# Patient Record
Sex: Female | Born: 1960 | Race: Black or African American | Hispanic: No | Marital: Married | Smoking: Current every day smoker
Health system: Southern US, Community
[De-identification: ages and names within clinical notes are randomized; demographics above are authoritative.]

## PROBLEM LIST (undated history)

## (undated) DIAGNOSIS — L309 Dermatitis, unspecified: Secondary | ICD-10-CM

## (undated) DIAGNOSIS — E785 Hyperlipidemia, unspecified: Secondary | ICD-10-CM

## (undated) DIAGNOSIS — E039 Hypothyroidism, unspecified: Secondary | ICD-10-CM

## (undated) DIAGNOSIS — E079 Disorder of thyroid, unspecified: Secondary | ICD-10-CM

## (undated) HISTORY — PX: ABDOMINAL HYSTERECTOMY: SHX81

## (undated) HISTORY — DX: Hyperlipidemia, unspecified: E78.5

## (undated) HISTORY — PX: TUBAL LIGATION: SHX77

---

## 1998-08-11 ENCOUNTER — Emergency Department (HOSPITAL_COMMUNITY): Admission: EM | Admit: 1998-08-11 | Discharge: 1998-08-11 | Payer: Self-pay | Admitting: Emergency Medicine

## 1999-10-28 ENCOUNTER — Observation Stay (HOSPITAL_COMMUNITY): Admission: AD | Admit: 1999-10-28 | Discharge: 1999-10-29 | Payer: Self-pay | Admitting: *Deleted

## 1999-11-04 ENCOUNTER — Encounter (INDEPENDENT_AMBULATORY_CARE_PROVIDER_SITE_OTHER): Payer: Self-pay | Admitting: Specialist

## 1999-11-04 ENCOUNTER — Other Ambulatory Visit: Admission: RE | Admit: 1999-11-04 | Discharge: 1999-11-04 | Payer: Self-pay | Admitting: *Deleted

## 1999-11-04 ENCOUNTER — Encounter: Payer: Self-pay | Admitting: *Deleted

## 1999-11-04 ENCOUNTER — Encounter: Admission: RE | Admit: 1999-11-04 | Discharge: 1999-11-04 | Payer: Self-pay | Admitting: *Deleted

## 1999-11-22 ENCOUNTER — Encounter: Payer: Self-pay | Admitting: *Deleted

## 1999-11-25 ENCOUNTER — Inpatient Hospital Stay (HOSPITAL_COMMUNITY): Admission: RE | Admit: 1999-11-25 | Discharge: 1999-11-27 | Payer: Self-pay | Admitting: *Deleted

## 1999-11-25 ENCOUNTER — Encounter (INDEPENDENT_AMBULATORY_CARE_PROVIDER_SITE_OTHER): Payer: Self-pay | Admitting: Specialist

## 2003-12-04 ENCOUNTER — Emergency Department (HOSPITAL_COMMUNITY): Admission: EM | Admit: 2003-12-04 | Discharge: 2003-12-04 | Payer: Self-pay | Admitting: Emergency Medicine

## 2003-12-04 IMAGING — CR DG HIP (WITH OR WITHOUT PELVIS) 2-3V*L*
3 series · 3 of 3 positions shown · non-contrast
Comparison: none

CLINICAL DATA: Pain.  No known injury. 
 LEFT HIP COMPLETE 
 There is no evidence of fracture or dislocation.   No other significant bone or soft tissue abnormalities are identified.   The joint spaces are within normal limits.

 IMPRESSION
 Normal study.

[view not recorded (1 of 3)]
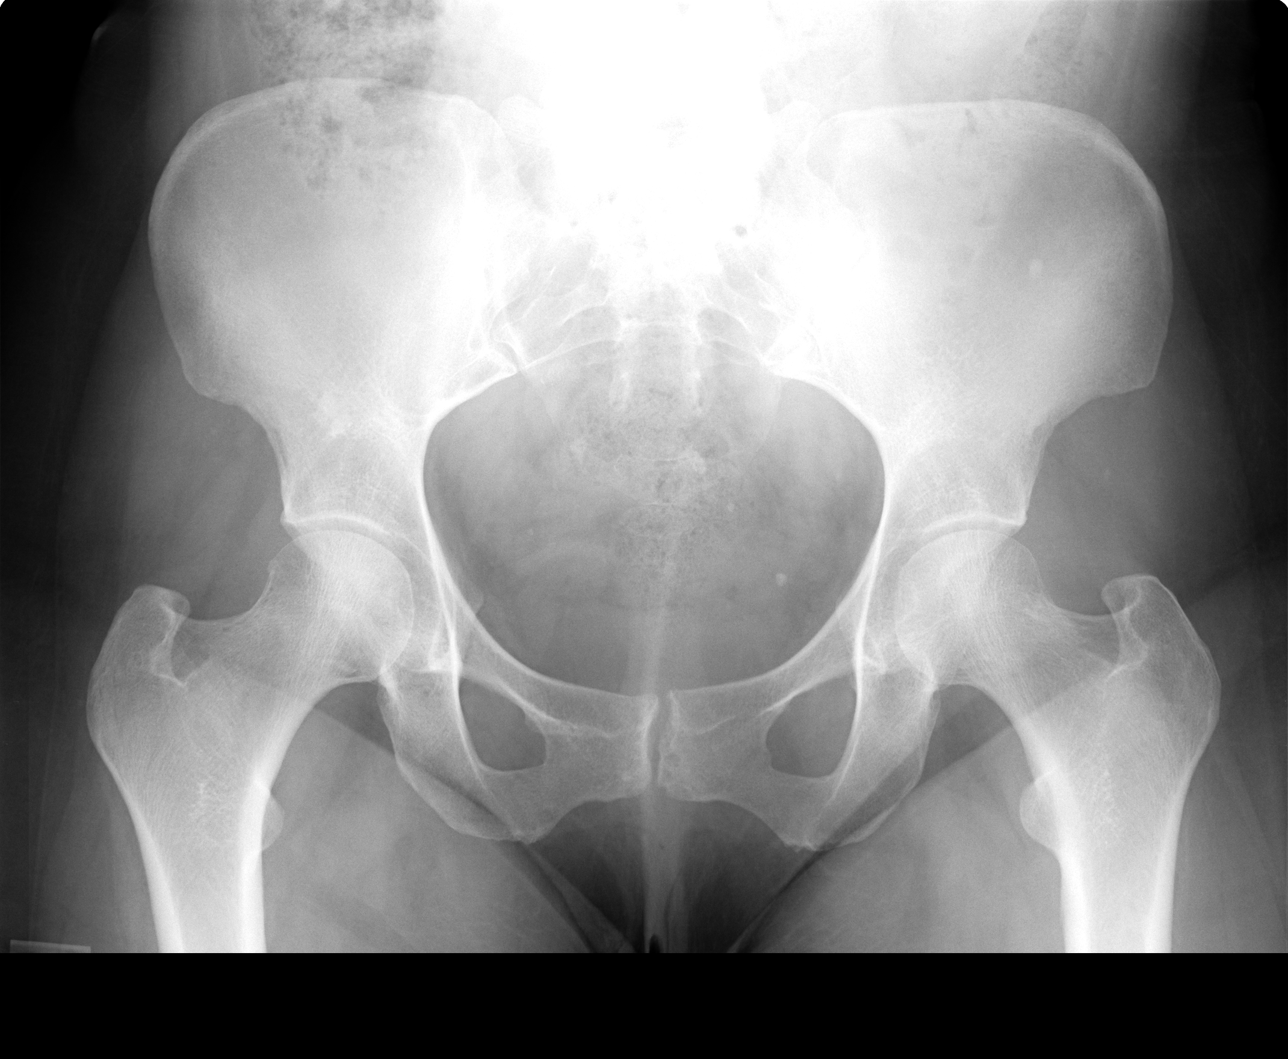

[view not recorded (2 of 3)]
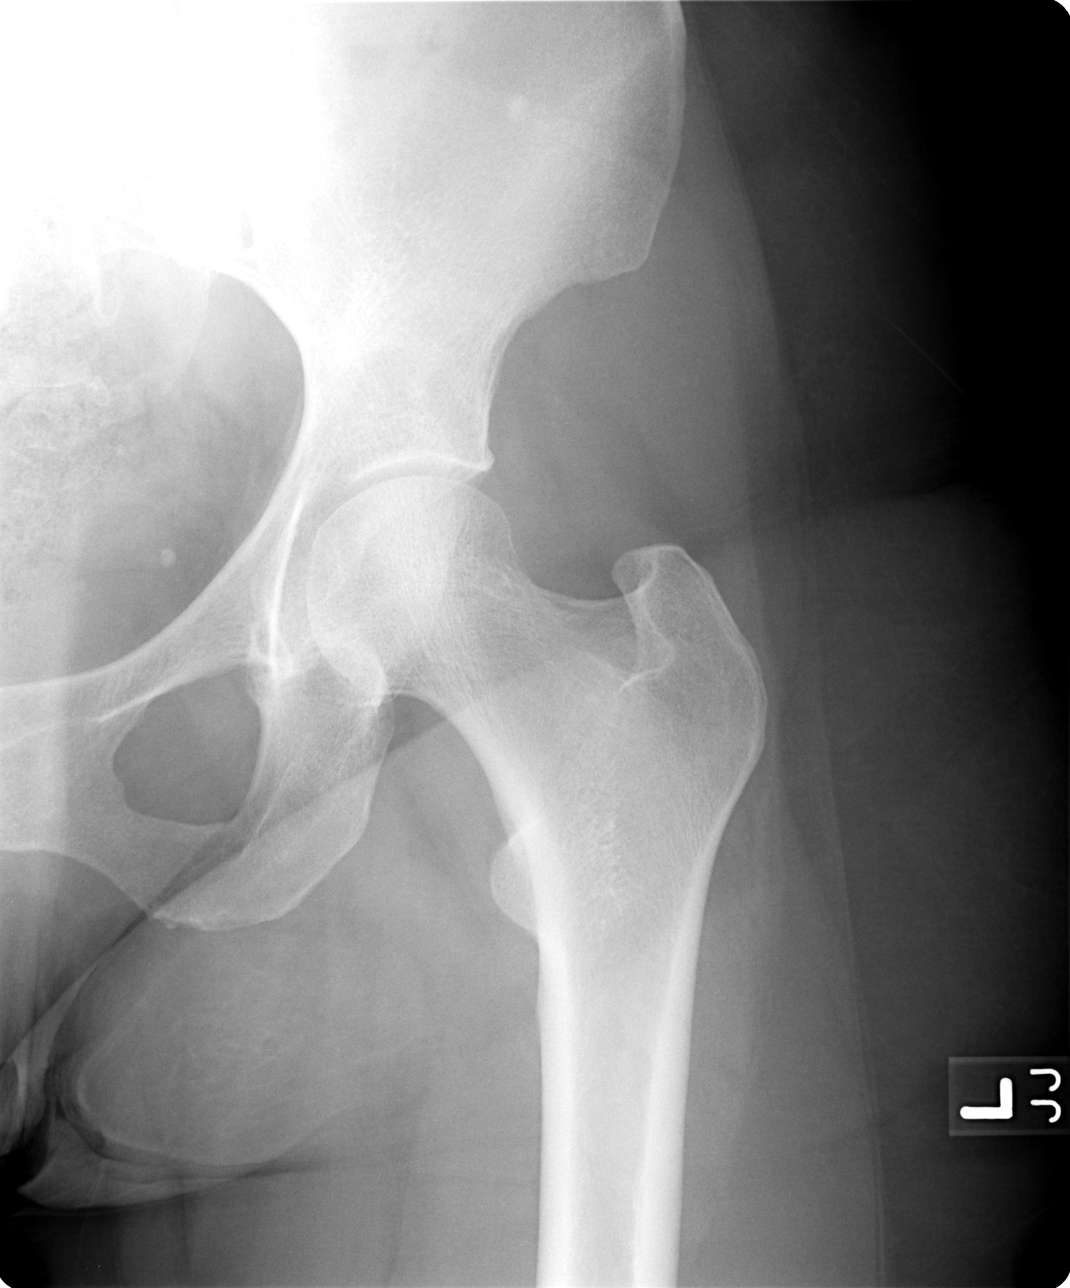

[view not recorded (3 of 3)]
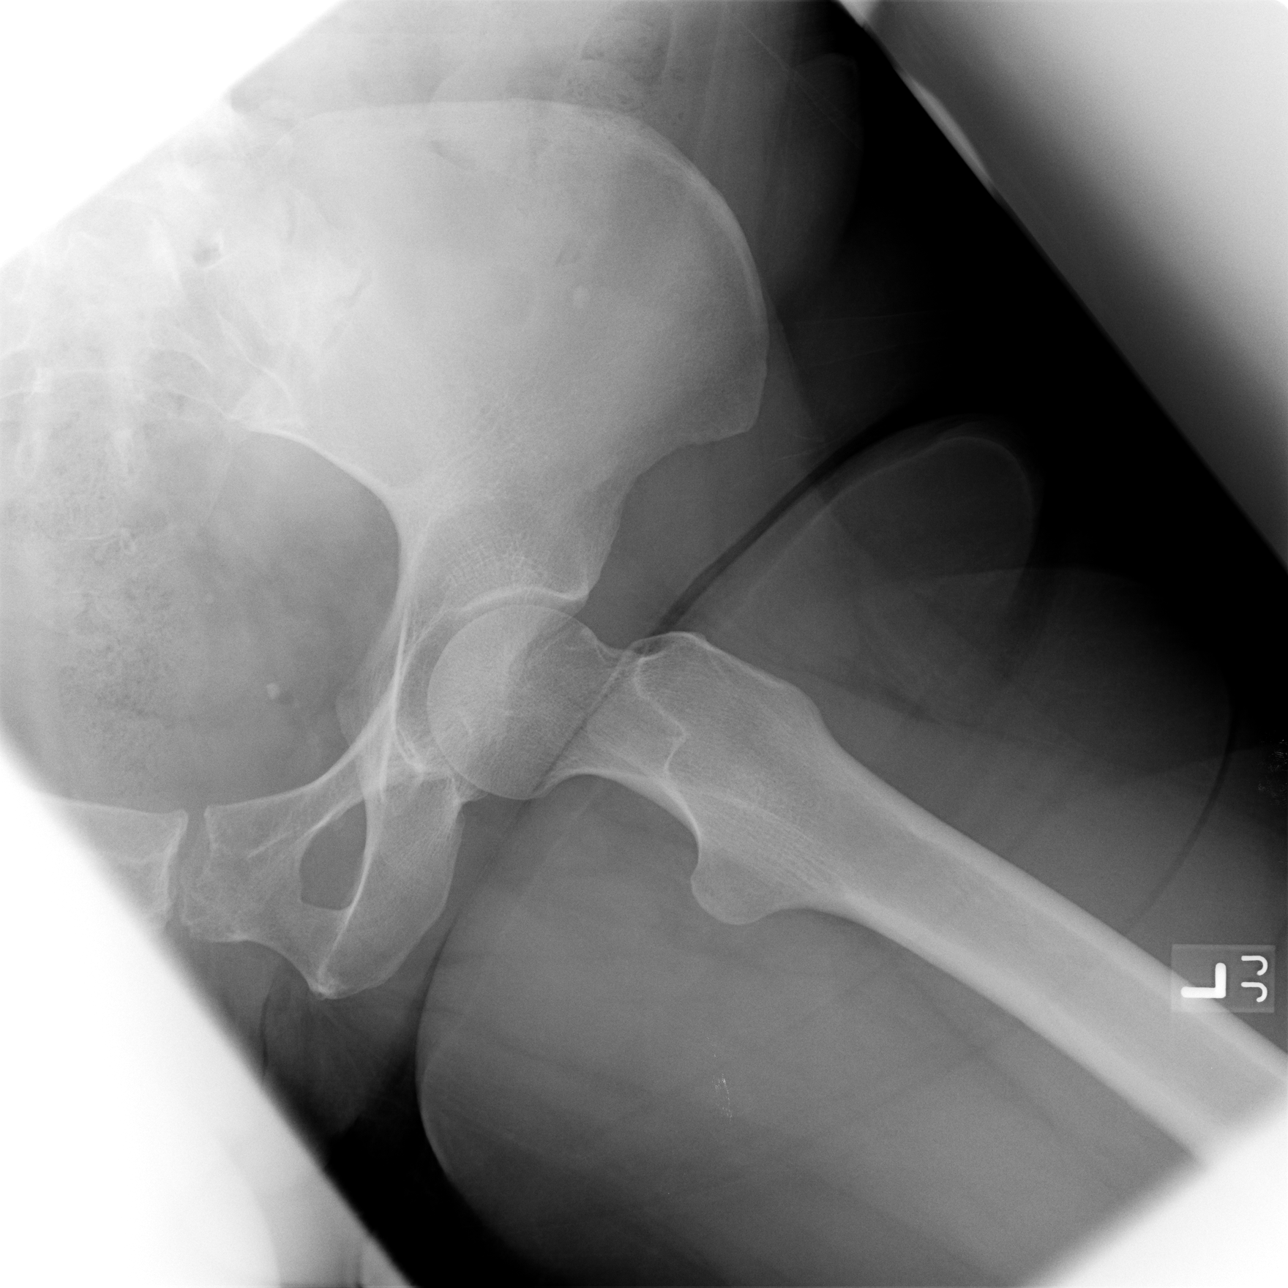

[3 of 3 positions shown; findings below may reference images not displayed]

## 2003-12-07 ENCOUNTER — Emergency Department (HOSPITAL_COMMUNITY): Admission: EM | Admit: 2003-12-07 | Discharge: 2003-12-07 | Payer: Self-pay | Admitting: Emergency Medicine

## 2004-03-13 ENCOUNTER — Emergency Department (HOSPITAL_COMMUNITY): Admission: EM | Admit: 2004-03-13 | Discharge: 2004-03-13 | Payer: Self-pay | Admitting: Emergency Medicine

## 2004-03-13 IMAGING — CR DG CHEST 2V
2 series · 2 of 2 positions shown · non-contrast
Comparison: none

CLINICAL DATA: Cough. Chest pain. 
 CHEST ? TWO VIEWS [DATE]:

[view not recorded (1 of 2)]
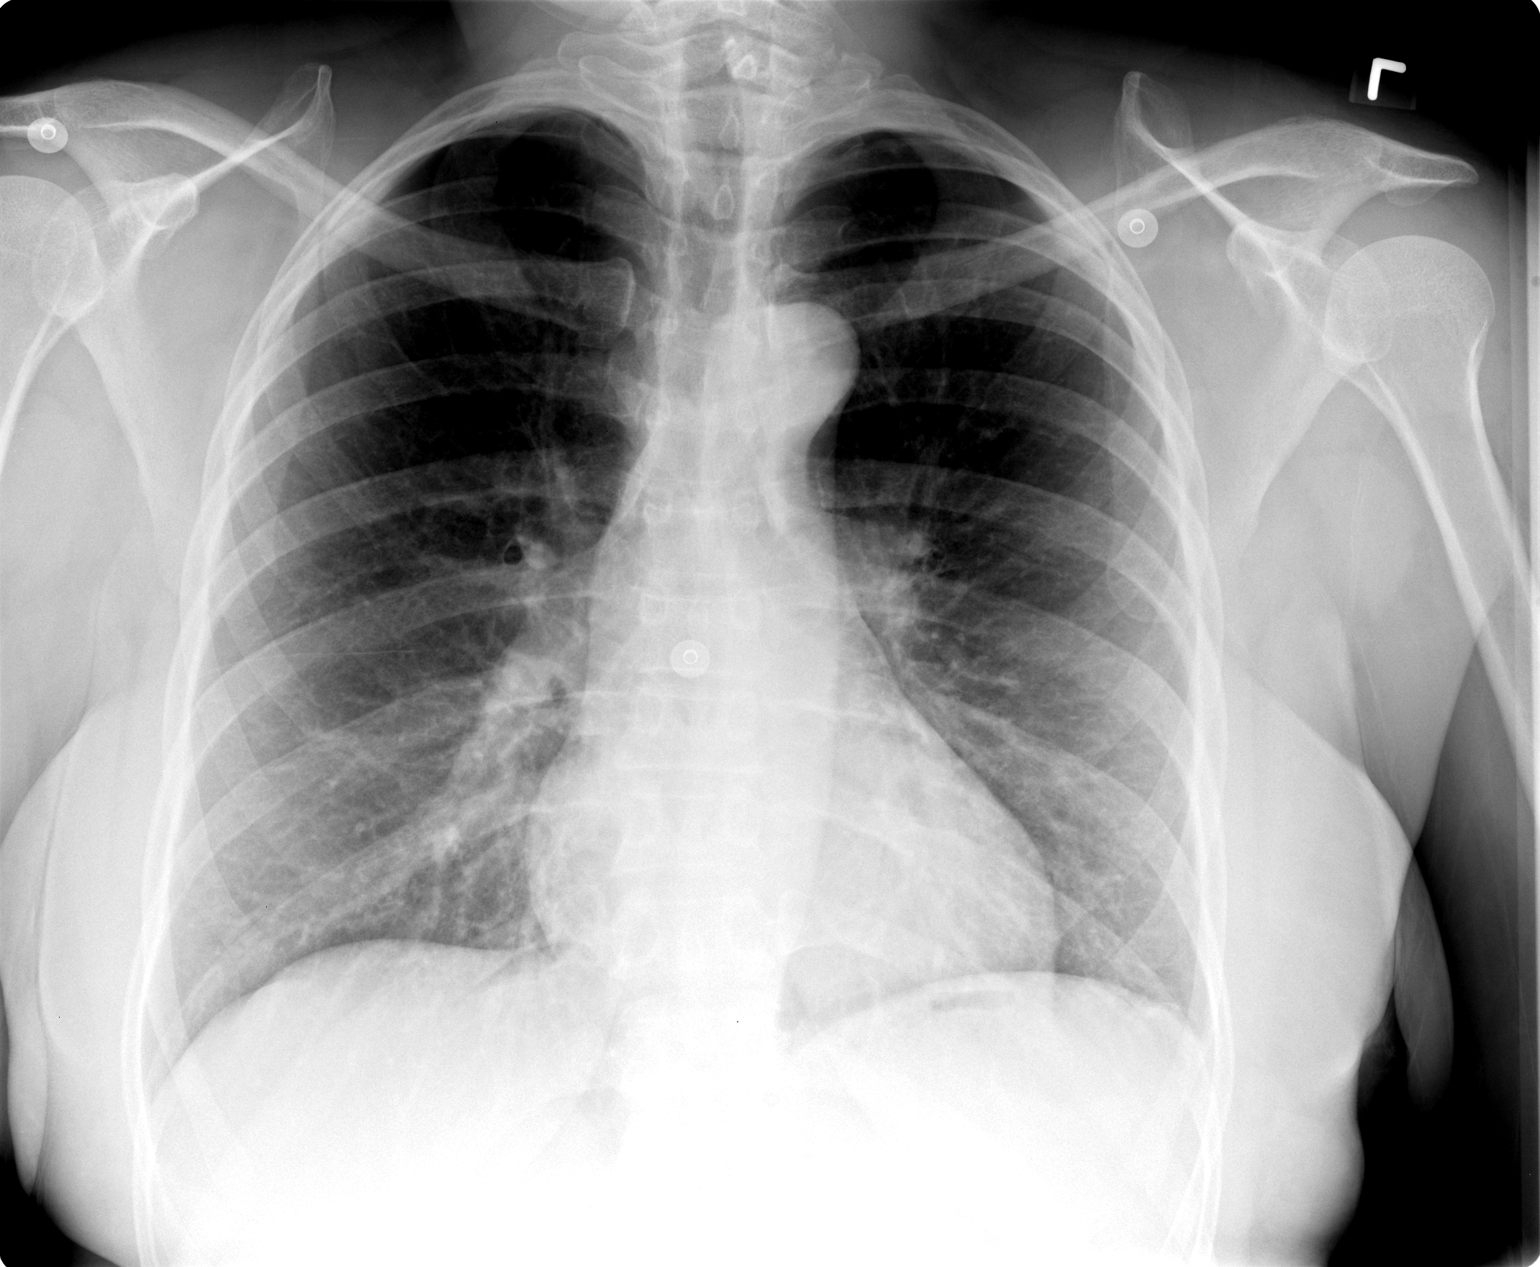

[view not recorded (2 of 2)]
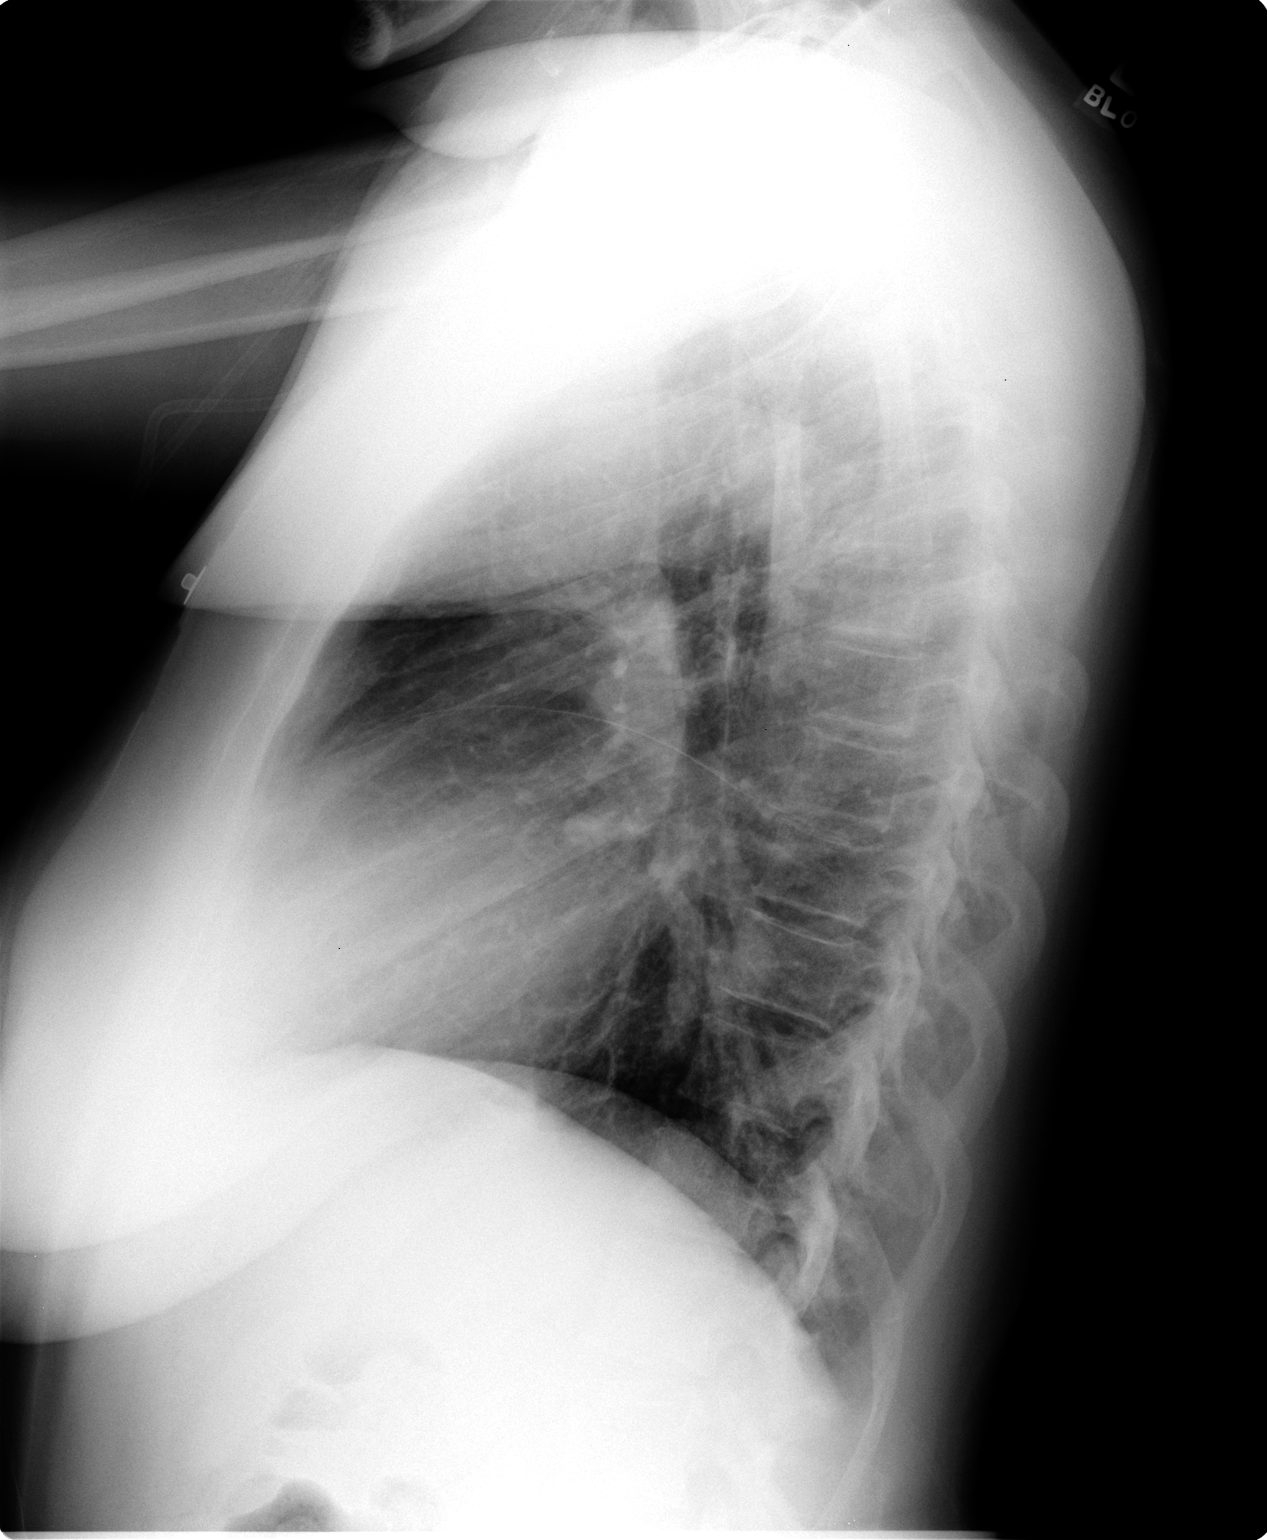

[2 of 2 positions shown; findings below may reference images not displayed]

FINDINGS: Upper limits normal heart size noted with mild peribronchial thickening.  No evidence of focal airspace disease.  No pleural effusions present.  Bony thorax and upper abdomen are unremarkable.
IMPRESSION: Upper limits normal heart size and mild peribronchial thickening.

## 2004-03-17 ENCOUNTER — Emergency Department (HOSPITAL_COMMUNITY): Admission: EM | Admit: 2004-03-17 | Discharge: 2004-03-17 | Payer: Self-pay | Admitting: Emergency Medicine

## 2004-03-26 ENCOUNTER — Emergency Department (HOSPITAL_COMMUNITY): Admission: EM | Admit: 2004-03-26 | Discharge: 2004-03-26 | Payer: Self-pay | Admitting: Emergency Medicine

## 2004-08-06 ENCOUNTER — Ambulatory Visit (HOSPITAL_COMMUNITY): Admission: RE | Admit: 2004-08-06 | Discharge: 2004-08-06 | Payer: Self-pay | Admitting: *Deleted

## 2005-01-07 ENCOUNTER — Ambulatory Visit: Payer: Self-pay | Admitting: Family Medicine

## 2005-01-13 ENCOUNTER — Emergency Department (HOSPITAL_COMMUNITY): Admission: EM | Admit: 2005-01-13 | Discharge: 2005-01-13 | Payer: Self-pay | Admitting: Emergency Medicine

## 2005-01-24 ENCOUNTER — Ambulatory Visit: Payer: Self-pay | Admitting: *Deleted

## 2005-01-25 ENCOUNTER — Ambulatory Visit: Payer: Self-pay | Admitting: Internal Medicine

## 2005-02-09 ENCOUNTER — Ambulatory Visit: Payer: Self-pay | Admitting: Family Medicine

## 2005-03-04 ENCOUNTER — Ambulatory Visit: Payer: Self-pay | Admitting: Internal Medicine

## 2005-04-06 ENCOUNTER — Ambulatory Visit: Payer: Self-pay | Admitting: Family Medicine

## 2005-09-15 ENCOUNTER — Emergency Department (HOSPITAL_COMMUNITY): Admission: EM | Admit: 2005-09-15 | Discharge: 2005-09-15 | Payer: Self-pay | Admitting: Emergency Medicine

## 2006-01-11 ENCOUNTER — Emergency Department (HOSPITAL_COMMUNITY): Admission: EM | Admit: 2006-01-11 | Discharge: 2006-01-11 | Payer: Self-pay | Admitting: Pulmonary Disease

## 2007-03-16 ENCOUNTER — Emergency Department (HOSPITAL_COMMUNITY): Admission: EM | Admit: 2007-03-16 | Discharge: 2007-03-16 | Payer: Self-pay | Admitting: Emergency Medicine

## 2007-03-18 ENCOUNTER — Emergency Department (HOSPITAL_COMMUNITY): Admission: EM | Admit: 2007-03-18 | Discharge: 2007-03-18 | Payer: Self-pay | Admitting: Emergency Medicine

## 2007-03-21 ENCOUNTER — Emergency Department (HOSPITAL_COMMUNITY): Admission: EM | Admit: 2007-03-21 | Discharge: 2007-03-21 | Payer: Self-pay | Admitting: Family Medicine

## 2007-04-10 ENCOUNTER — Emergency Department (HOSPITAL_COMMUNITY): Admission: EM | Admit: 2007-04-10 | Discharge: 2007-04-11 | Payer: Self-pay | Admitting: Emergency Medicine

## 2007-04-11 ENCOUNTER — Encounter (INDEPENDENT_AMBULATORY_CARE_PROVIDER_SITE_OTHER): Payer: Self-pay | Admitting: *Deleted

## 2007-04-11 ENCOUNTER — Ambulatory Visit (HOSPITAL_COMMUNITY): Admission: RE | Admit: 2007-04-11 | Discharge: 2007-04-11 | Payer: Self-pay | Admitting: *Deleted

## 2007-04-11 ENCOUNTER — Ambulatory Visit: Payer: Self-pay | Admitting: *Deleted

## 2007-06-08 ENCOUNTER — Emergency Department (HOSPITAL_COMMUNITY): Admission: EM | Admit: 2007-06-08 | Discharge: 2007-06-08 | Payer: Self-pay | Admitting: Emergency Medicine

## 2007-06-21 ENCOUNTER — Emergency Department (HOSPITAL_COMMUNITY): Admission: EM | Admit: 2007-06-21 | Discharge: 2007-06-21 | Payer: Self-pay | Admitting: Emergency Medicine

## 2007-06-21 IMAGING — CR DG SHOULDER 2+V*L*
3 series · 3 of 3 positions shown · non-contrast
Comparison: none

CLINICAL DATA: Chest and left shoulder pain since this morning. 
 LEFT SHOULDER ? 3 VIEW:

[w shoulder ap internal left]
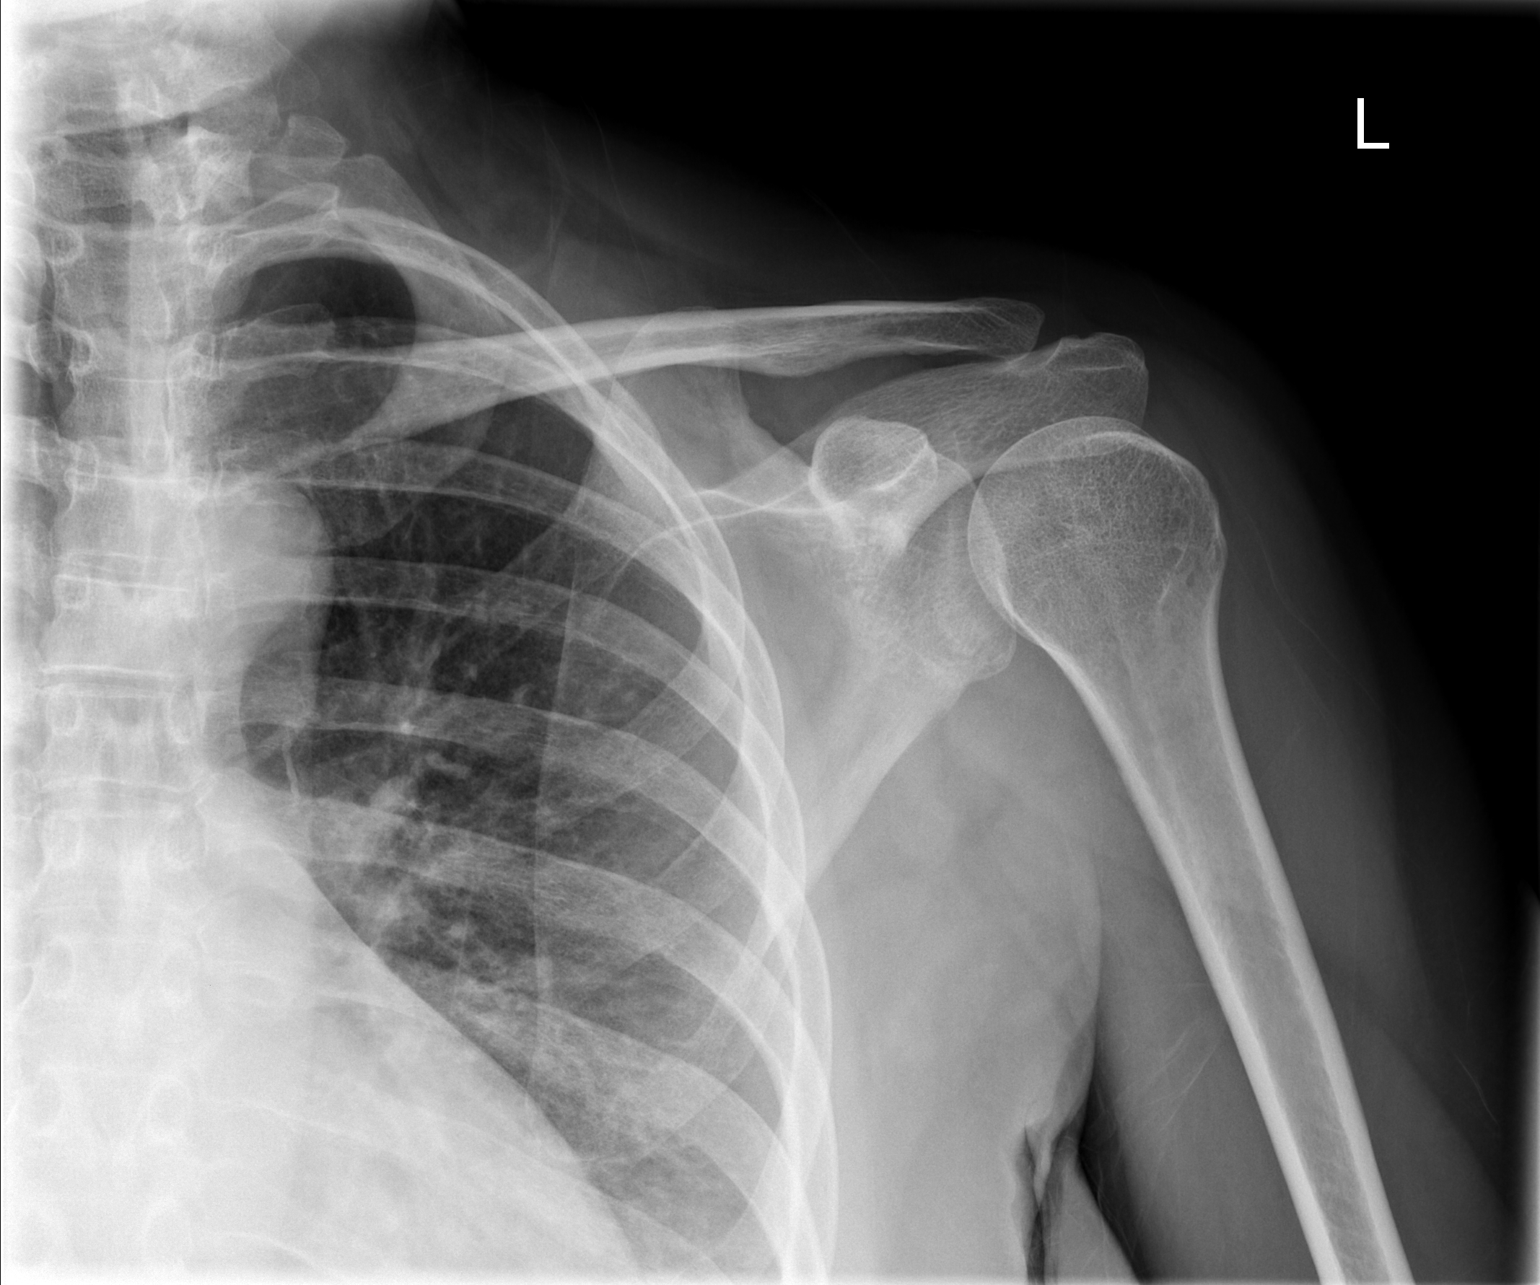

[w shoulder ap external left]
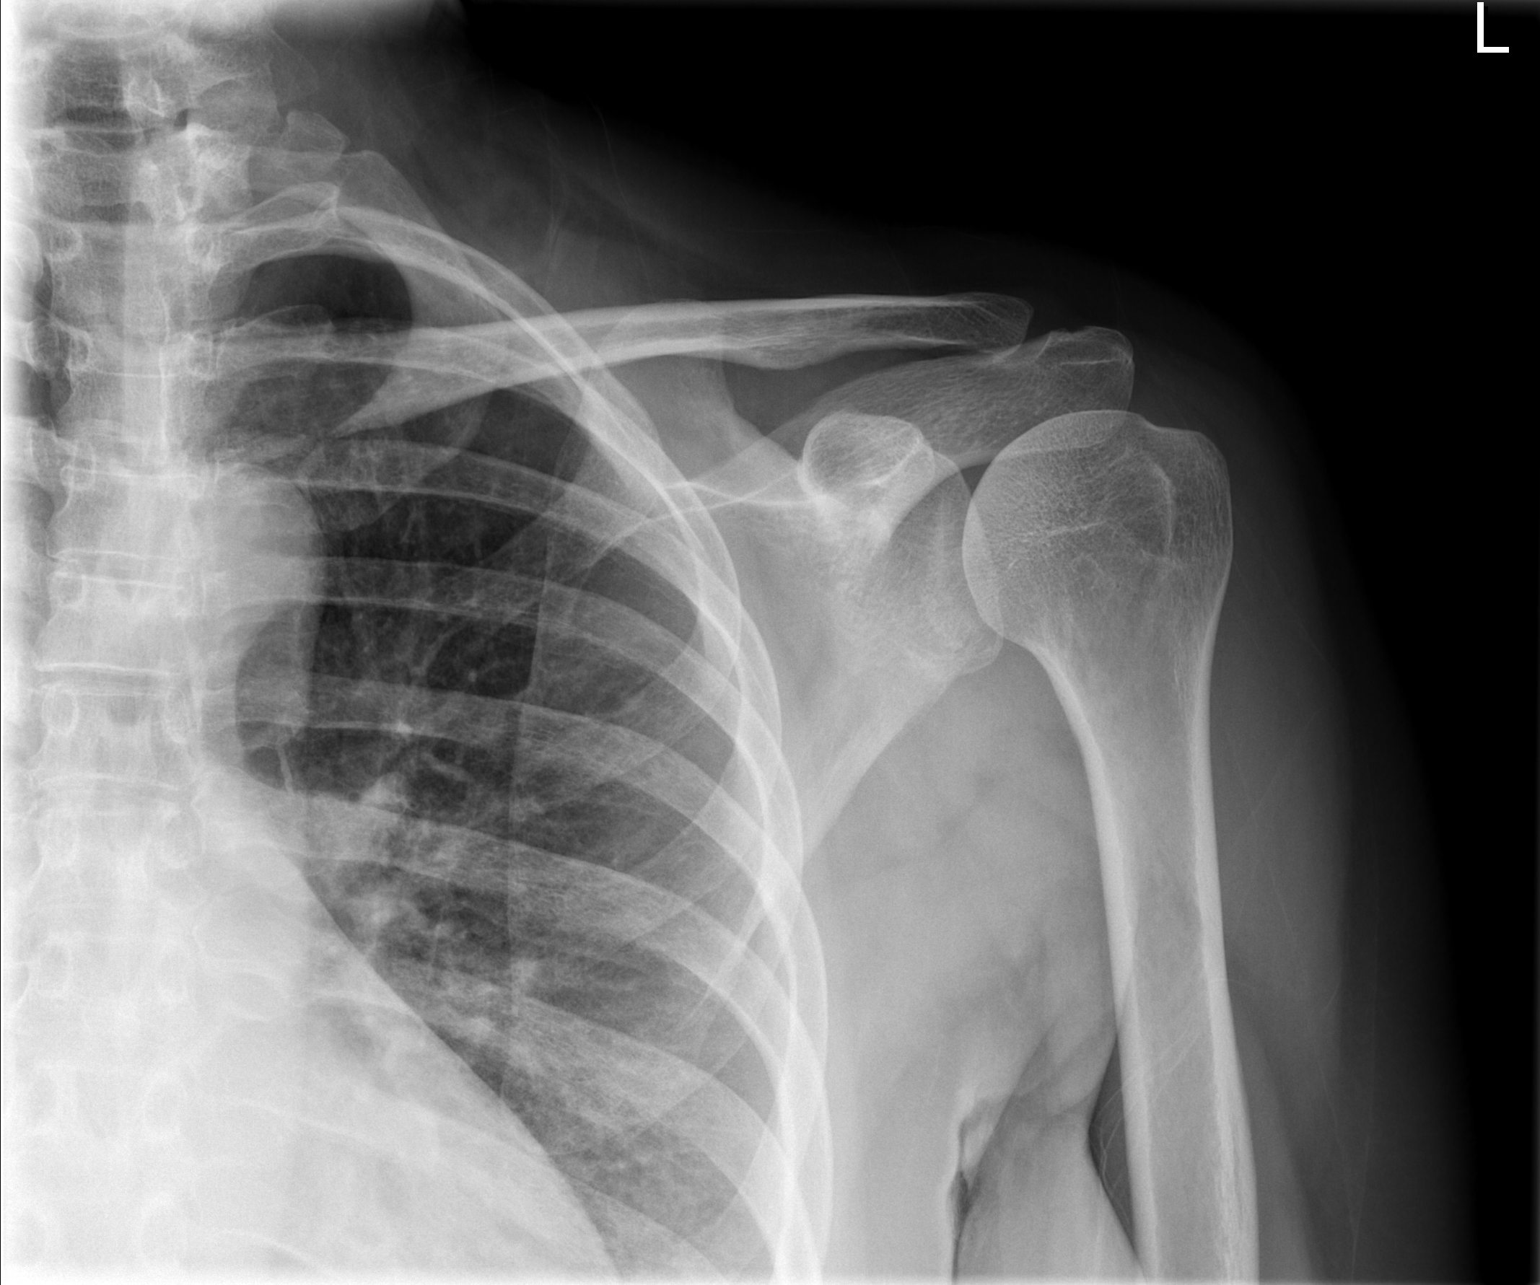

[w shoulder y view left]
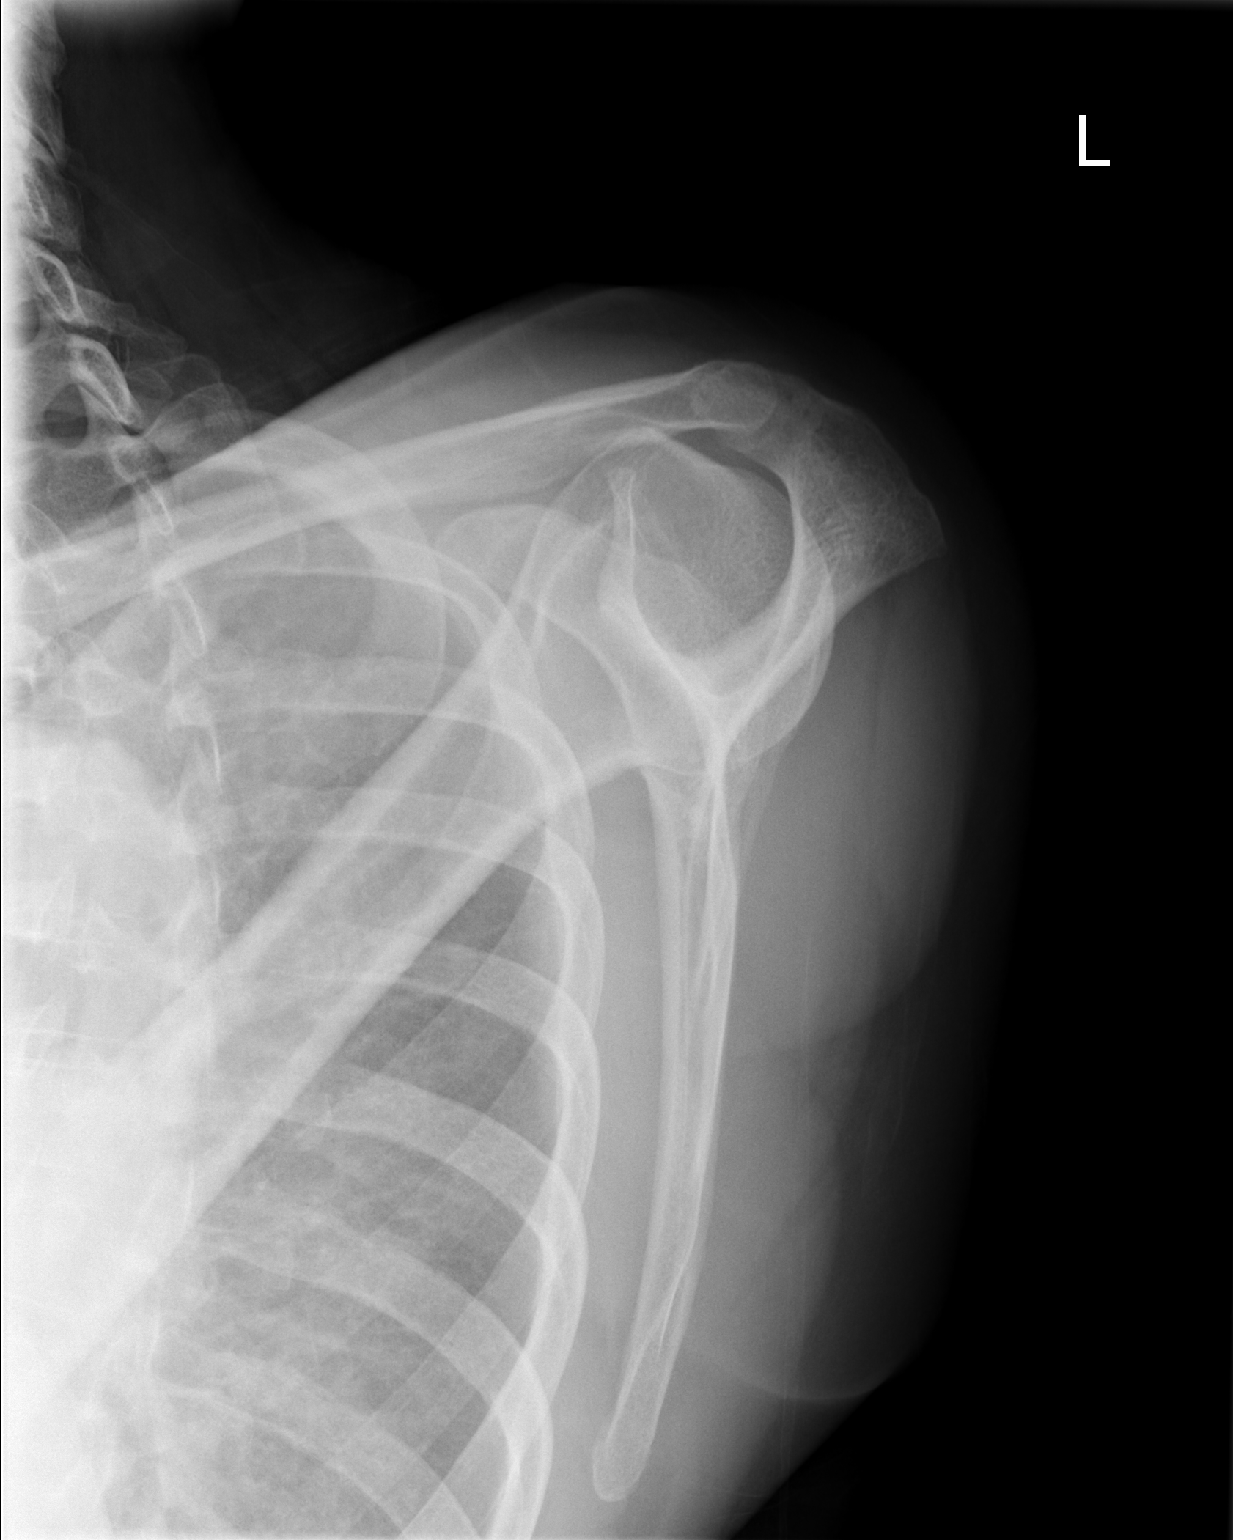

[3 of 3 positions shown; findings below may reference images not displayed]

FINDINGS: There is no evidence of fracture or dislocation.  There is no evidence of arthropathy or other focal bone abnormality.  Soft tissues are unremarkable.
IMPRESSION: Negative.

## 2007-06-21 IMAGING — CR DG CHEST 2V
2 series · 2 of 2 positions shown · non-contrast
Comparison: [DATE].

CLINICAL DATA: Chest and left shoulder pain. 
 CHEST - 2 VIEW:

[w chest pa]
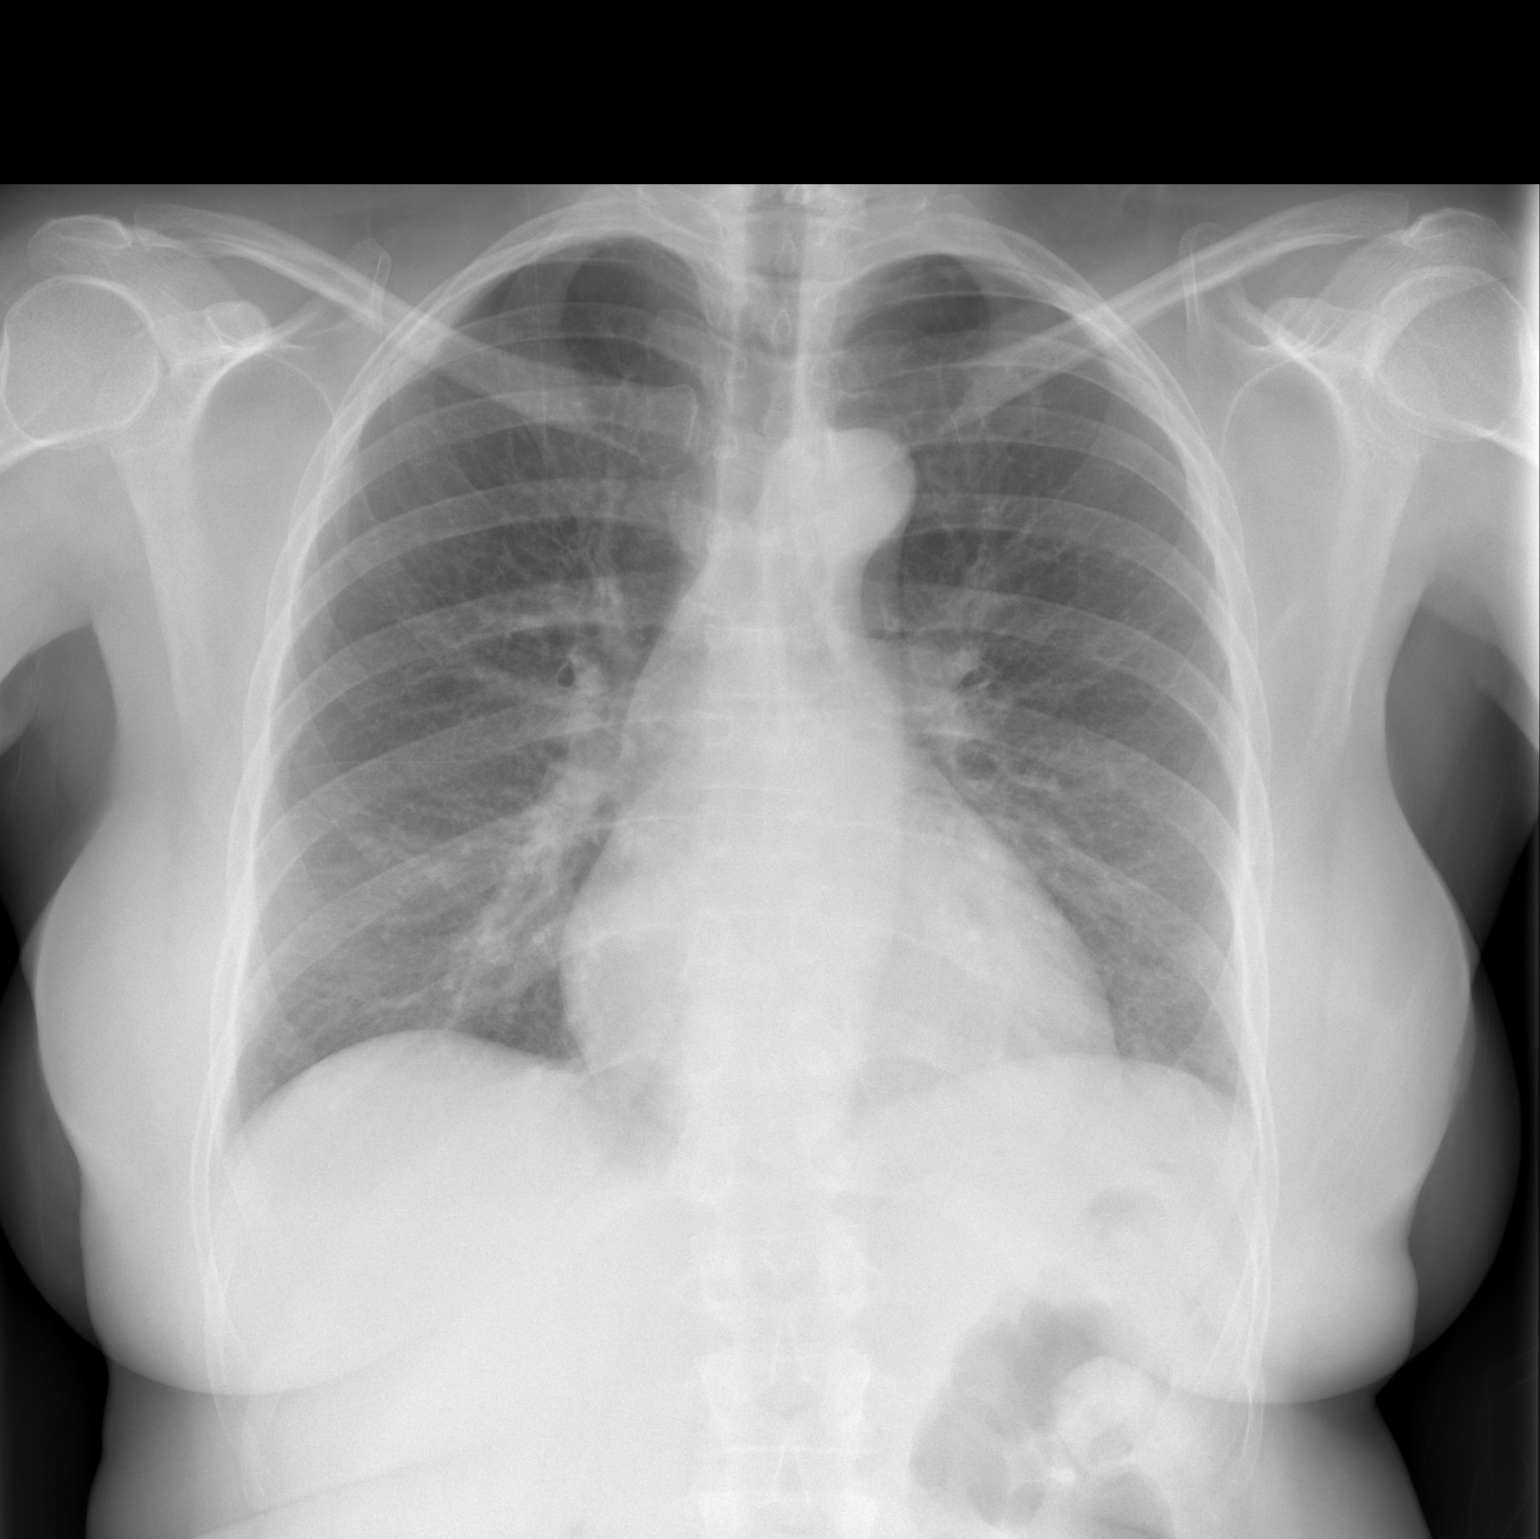

[w chest lat]
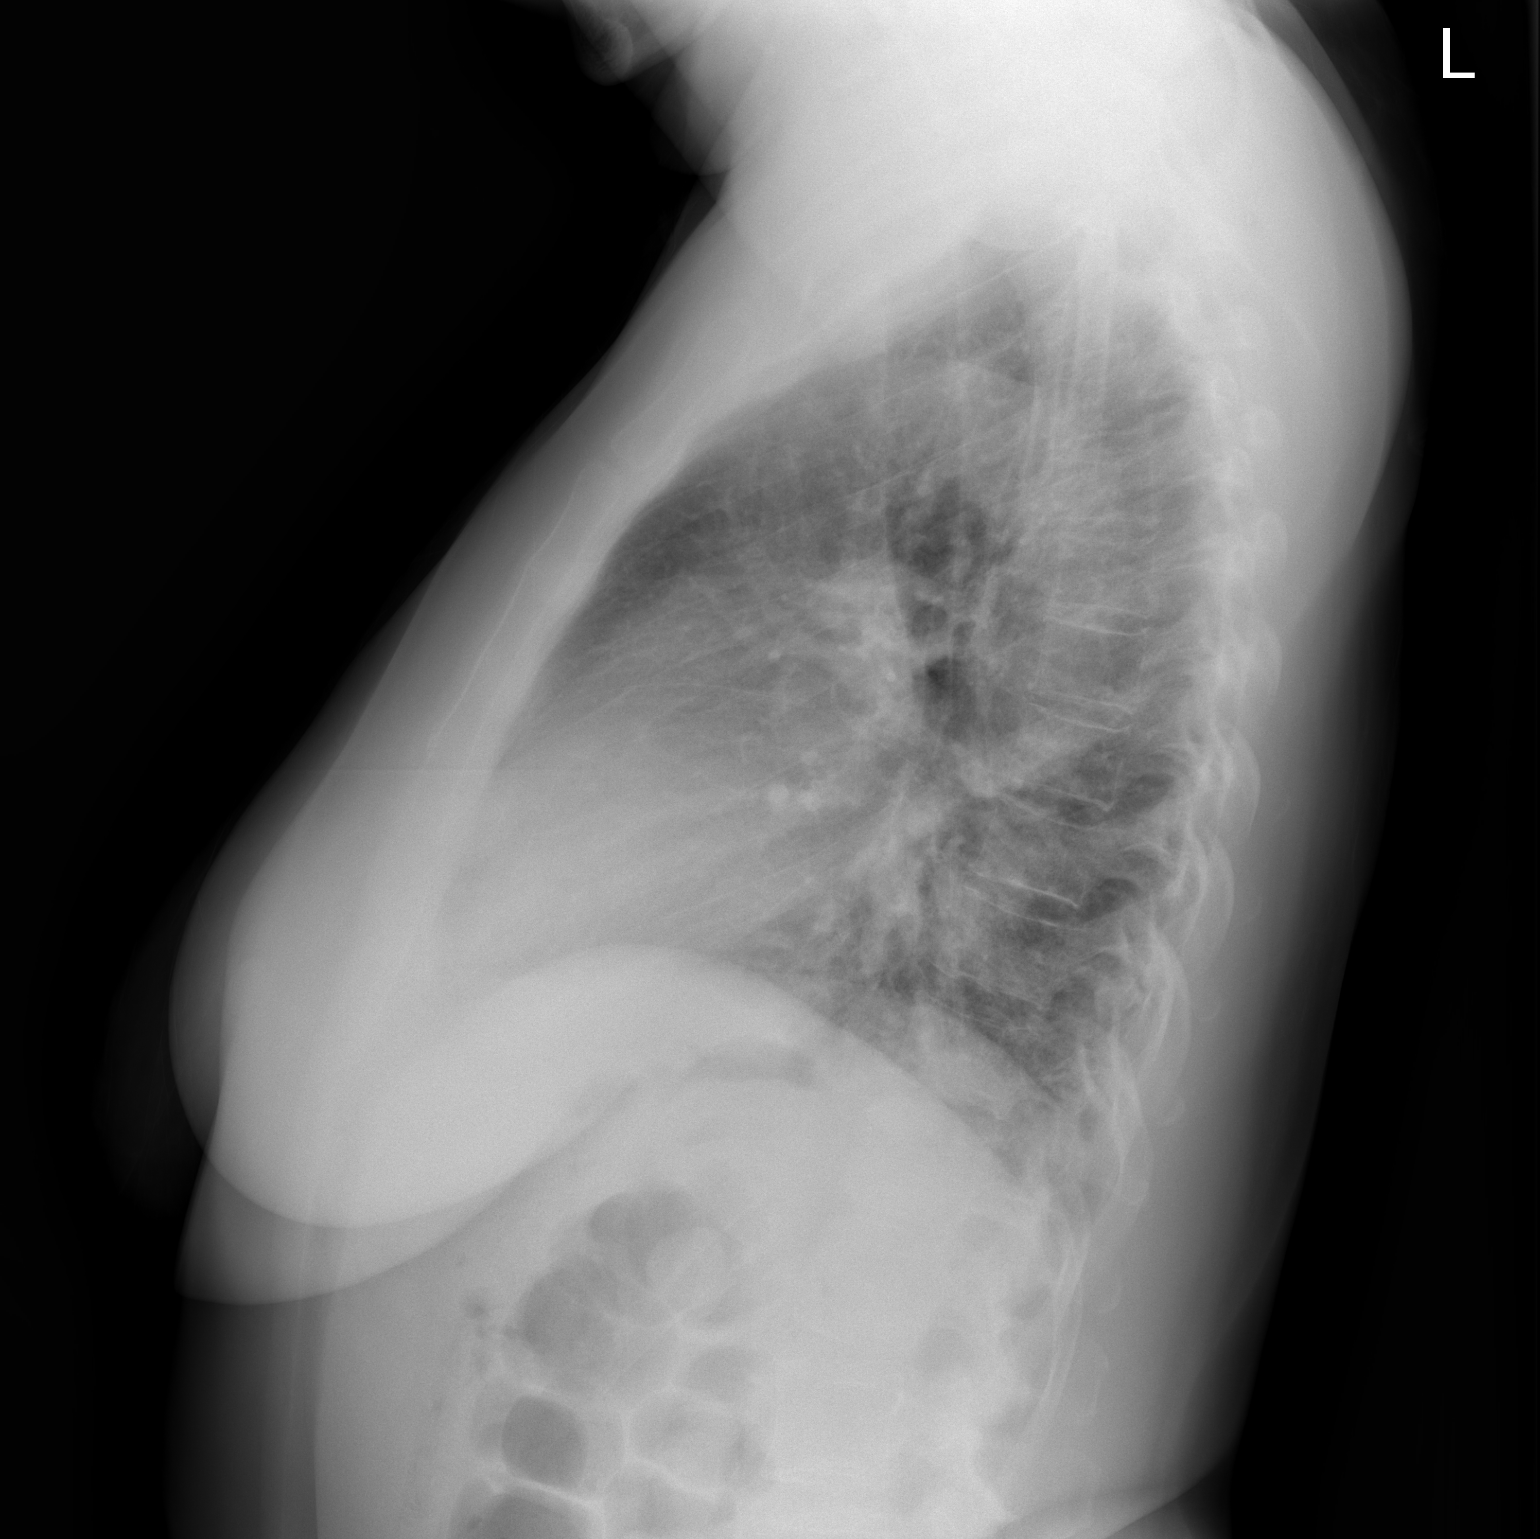

[2 of 2 positions shown; findings below may reference images not displayed]

FINDINGS: The heart size and mediastinal contours are stable.  Minimal atelectasis is present at the left lung base.   The lungs are otherwise clear.  There is no pleural effusion or pneumothorax.
IMPRESSION: Minimal left basilar atelectasis.  No other acute chest findings.

## 2007-06-27 ENCOUNTER — Emergency Department (HOSPITAL_COMMUNITY): Admission: EM | Admit: 2007-06-27 | Discharge: 2007-06-27 | Payer: Self-pay | Admitting: Family Medicine

## 2008-01-04 ENCOUNTER — Ambulatory Visit: Payer: Self-pay | Admitting: *Deleted

## 2008-01-04 ENCOUNTER — Emergency Department (HOSPITAL_COMMUNITY): Admission: EM | Admit: 2008-01-04 | Discharge: 2008-01-04 | Payer: Self-pay | Admitting: Emergency Medicine

## 2008-01-04 ENCOUNTER — Encounter (INDEPENDENT_AMBULATORY_CARE_PROVIDER_SITE_OTHER): Payer: Self-pay | Admitting: Emergency Medicine

## 2008-01-04 IMAGING — CR DG ANKLE COMPLETE 3+V*L*
3 series · 3 of 3 positions shown · non-contrast
Comparison: None.

CLINICAL DATA: No injury.  Pain for past 2 months.

LEFT ANKLE COMPLETE - 3+ VIEW

[t ankle joint ap left]
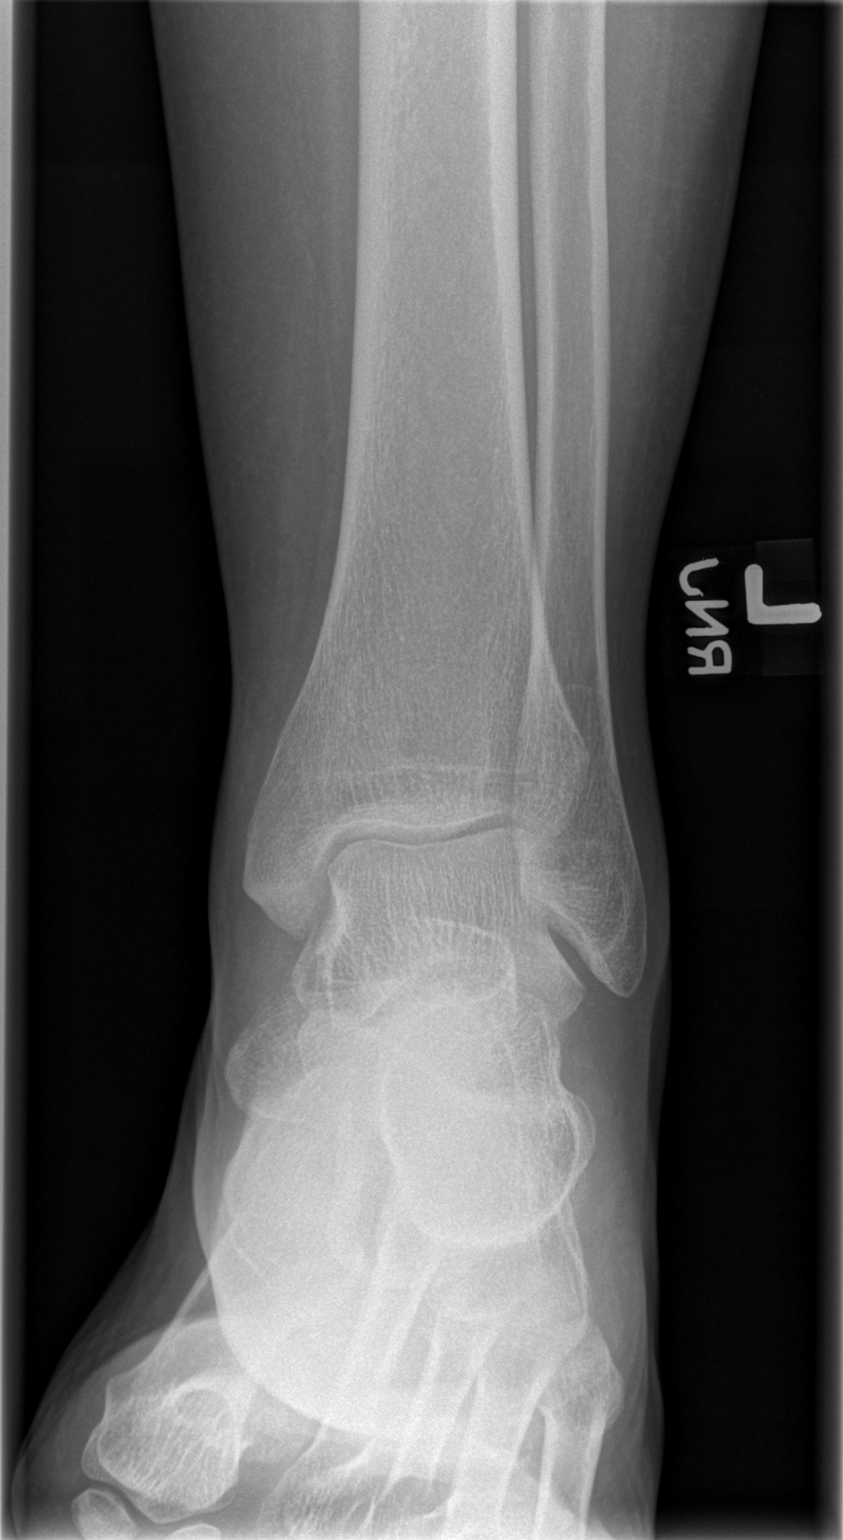

[t ankle joint oblique left]
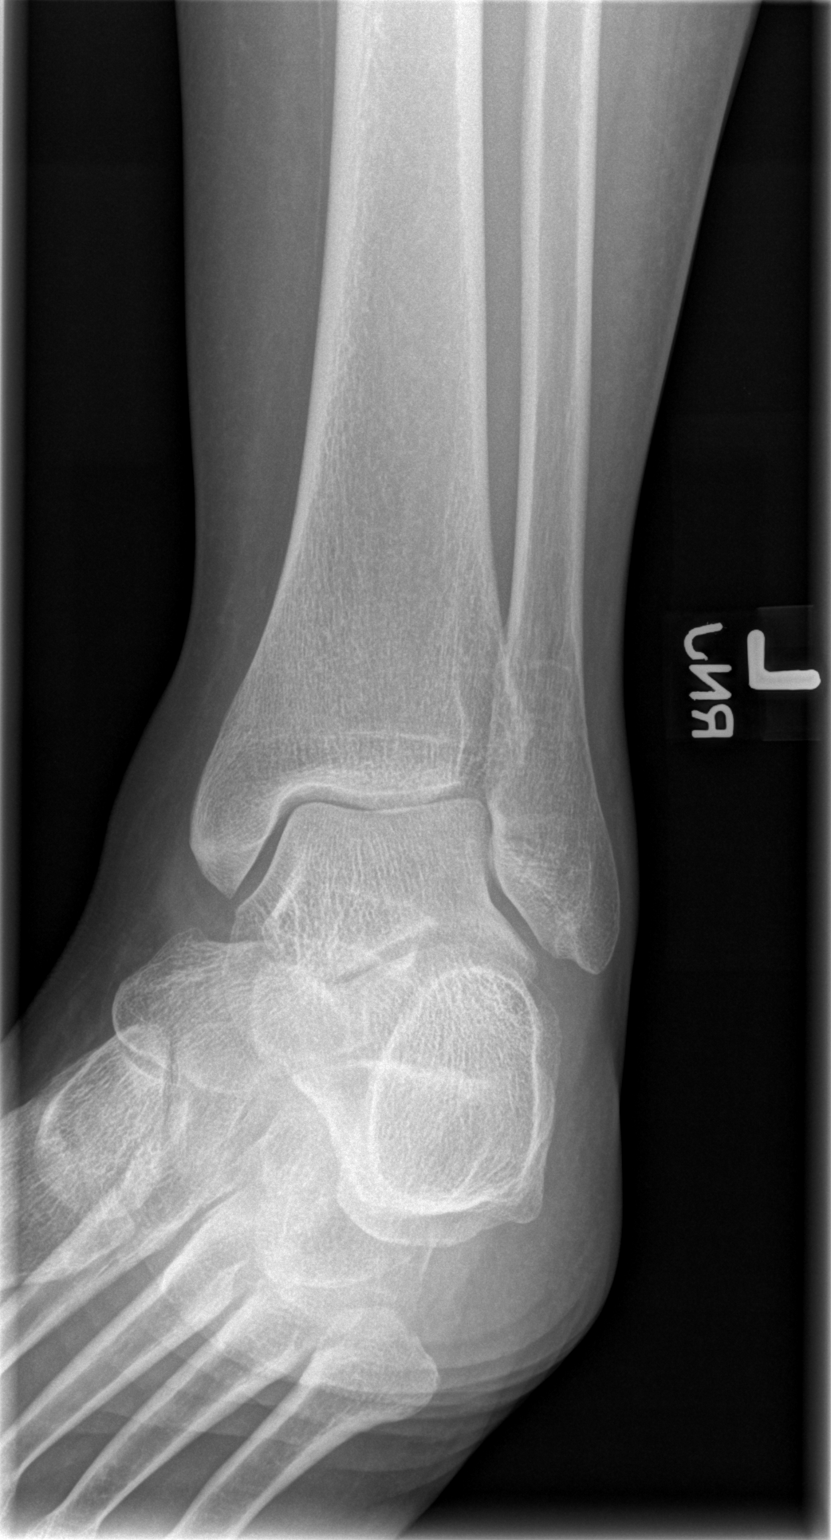

[t ankle joint lat left]
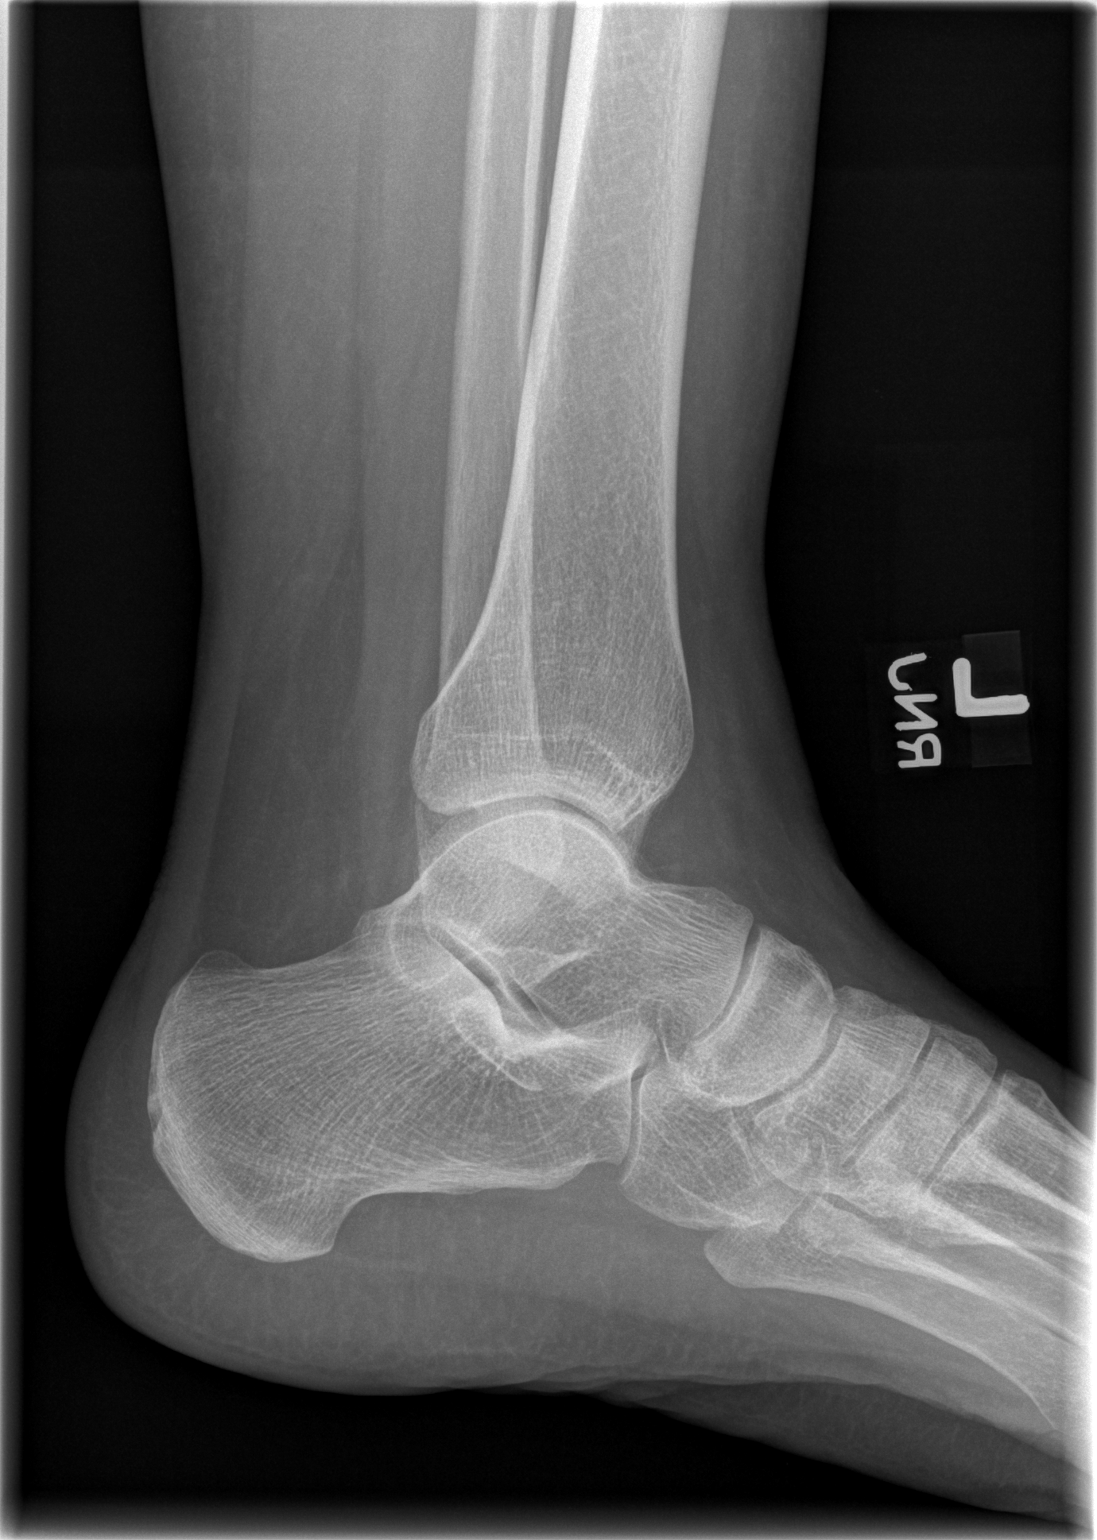

[3 of 3 positions shown; findings below may reference images not displayed]

FINDINGS: No fracture or dislocation.  No bony destructive lesion.
No significant degenerative changes or spur formation.  No evidence
of stress injury detected on this plain film exam.
IMPRESSION: Negative plain film exam left ankle.

## 2008-06-30 ENCOUNTER — Inpatient Hospital Stay (HOSPITAL_COMMUNITY): Admission: EM | Admit: 2008-06-30 | Discharge: 2008-06-30 | Payer: Self-pay | Admitting: Emergency Medicine

## 2008-06-30 IMAGING — CR DG CHEST 2V
2 series · 2 of 2 positions shown · non-contrast
Comparison: [DATE]

CLINICAL DATA: Chest pain

CHEST - 2 VIEW

[w chest pa]
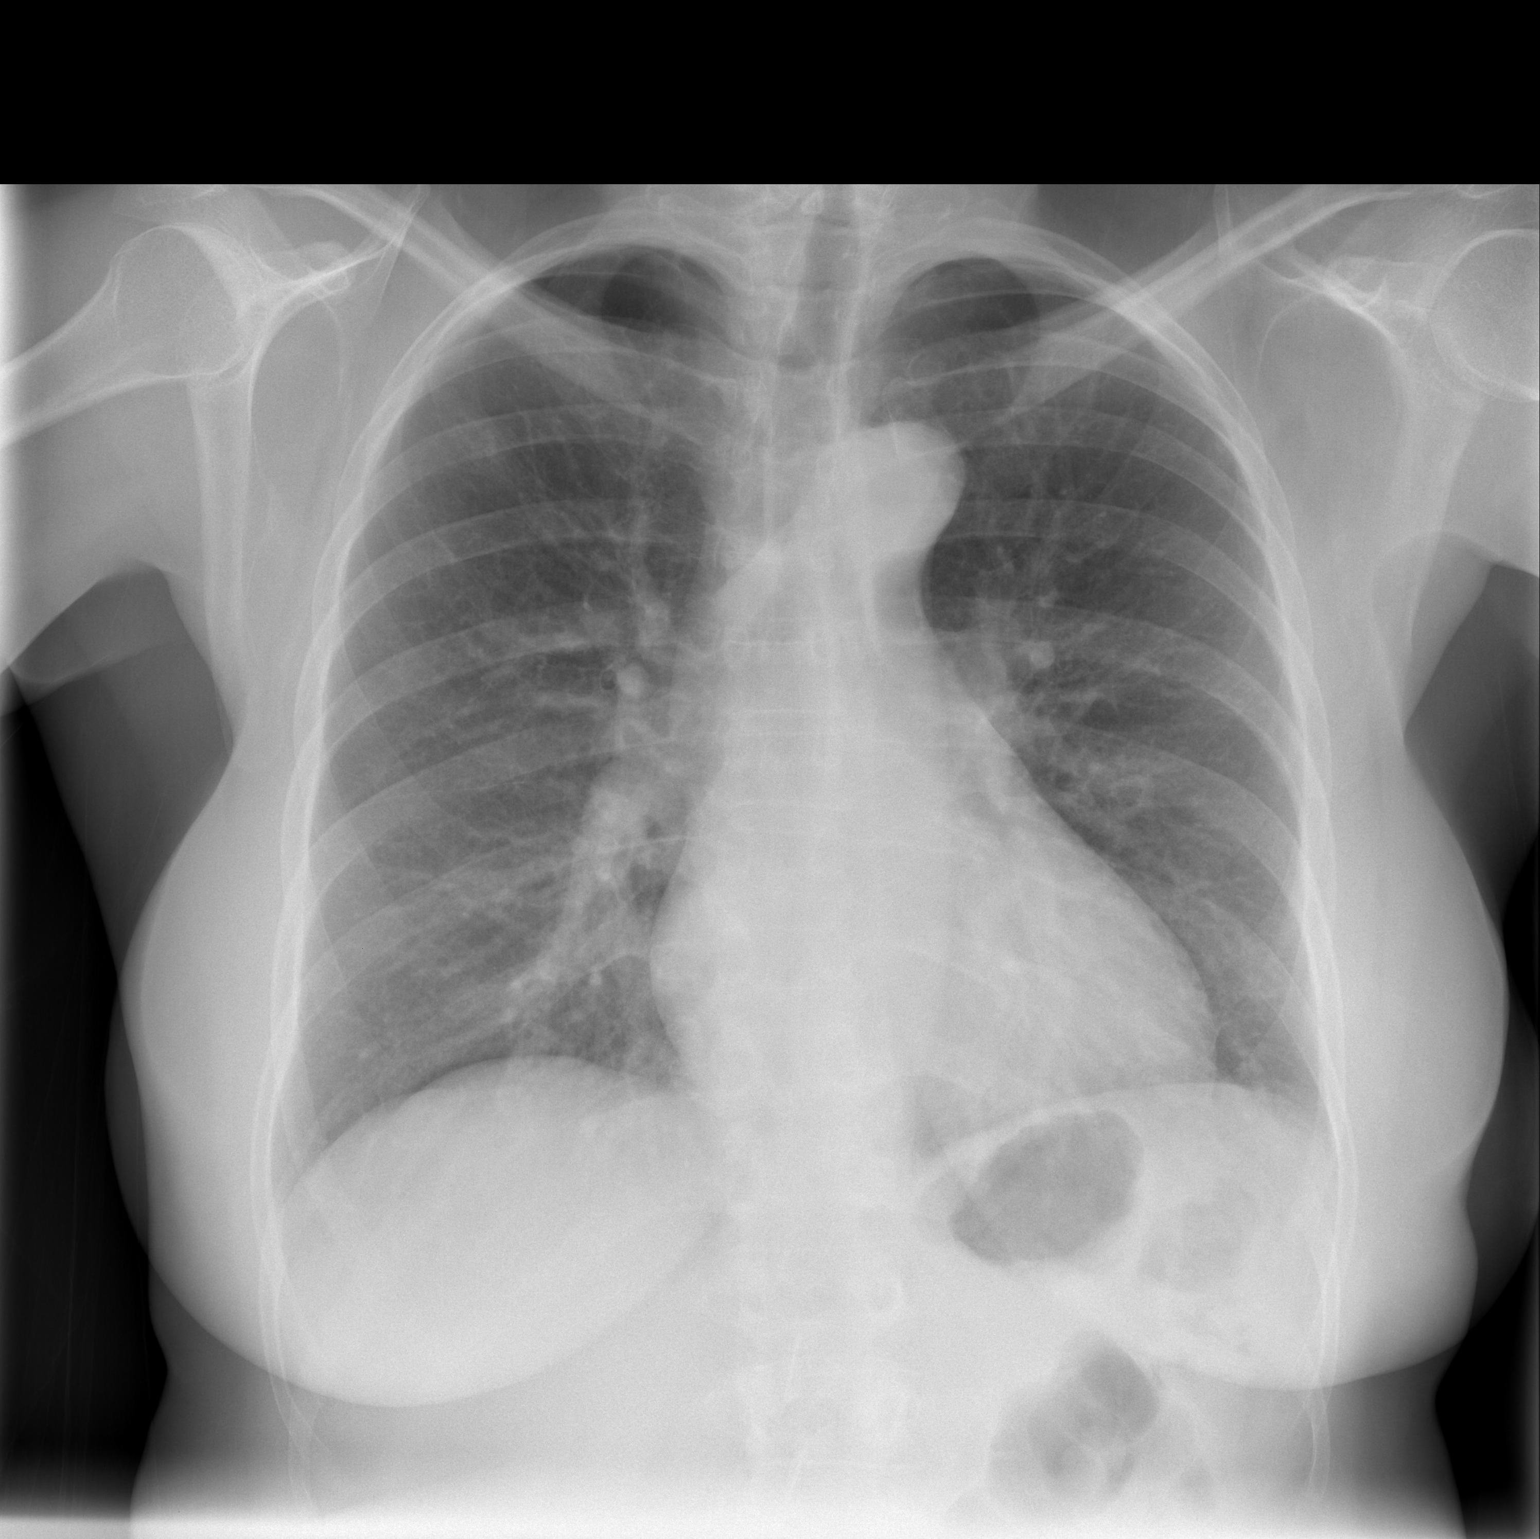

[w chest lat]
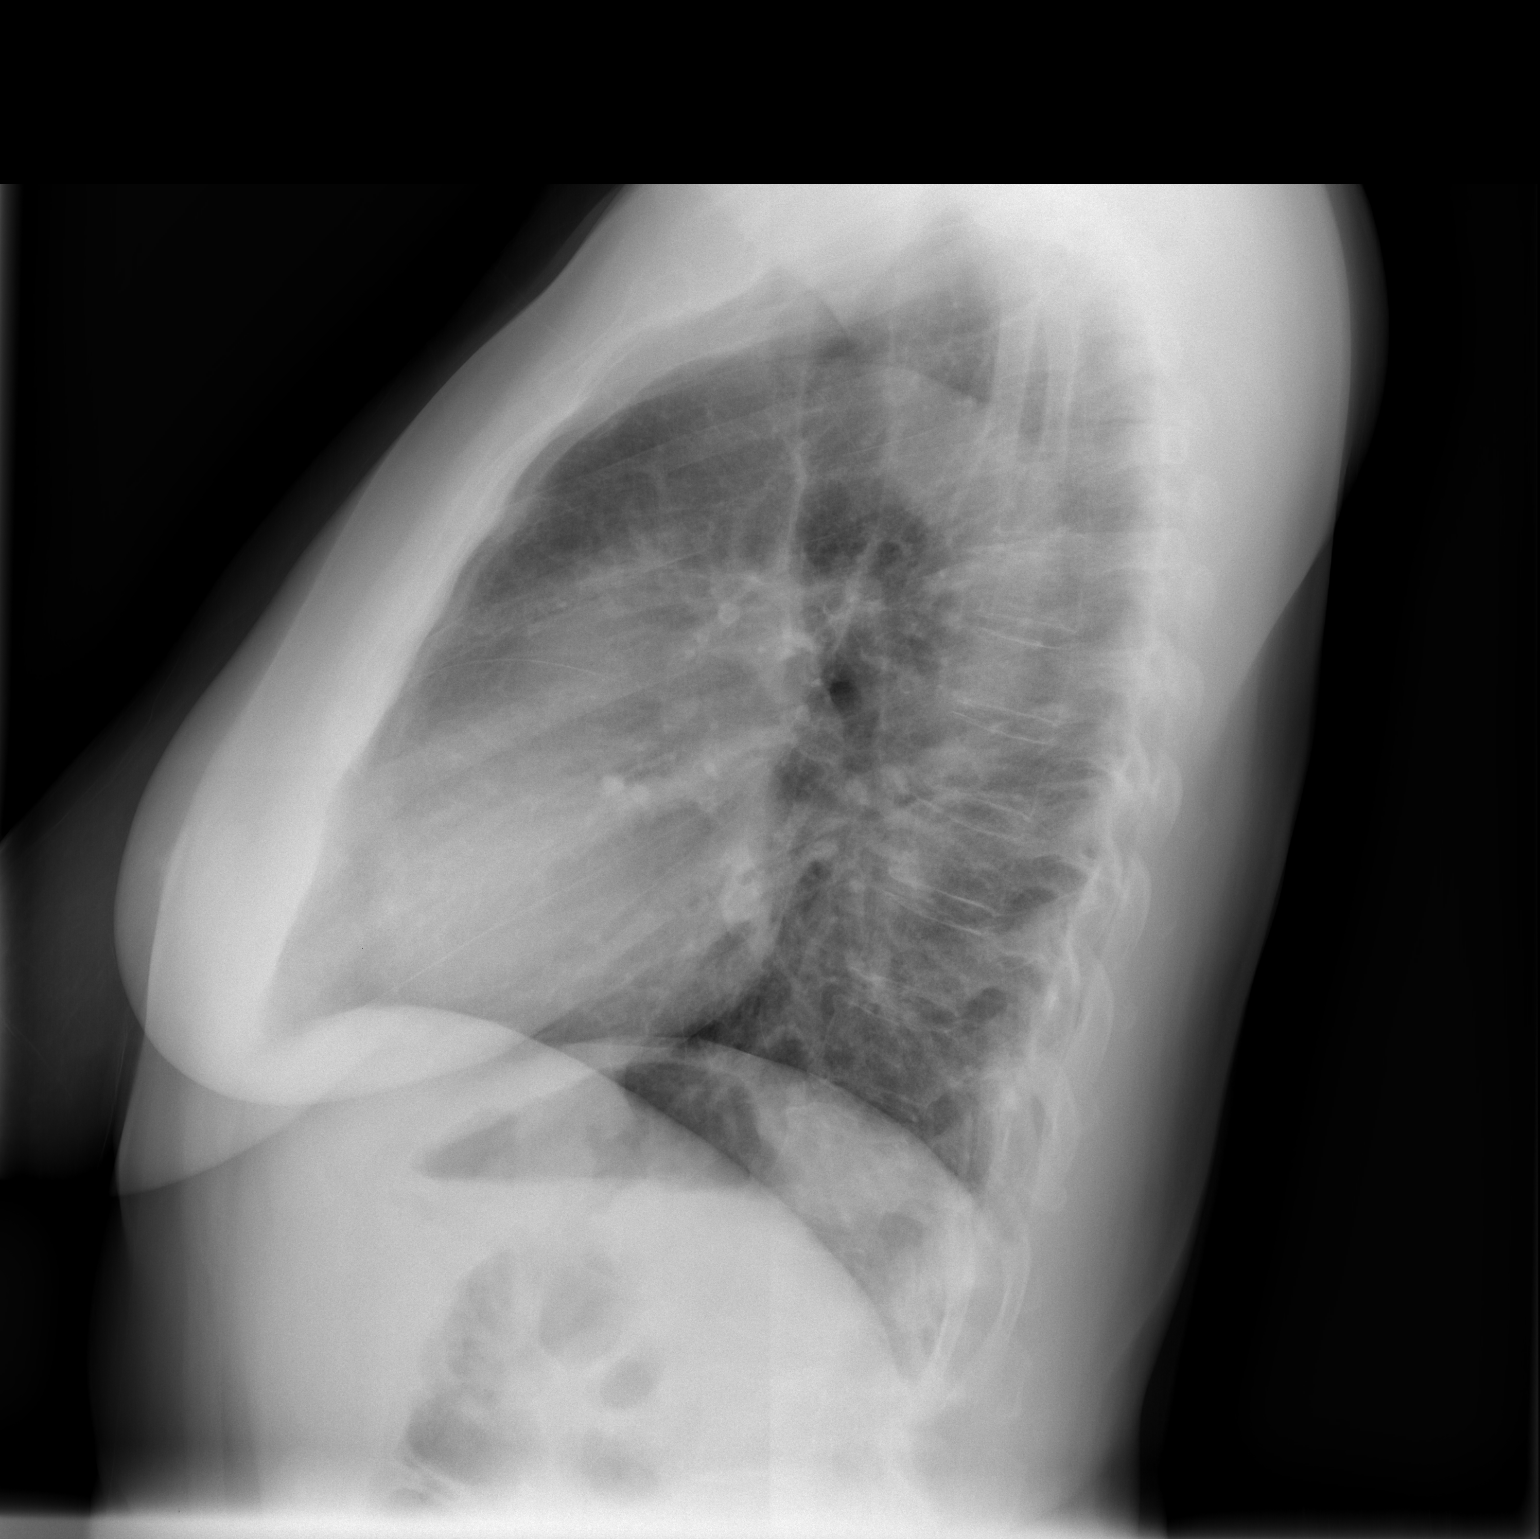

[2 of 2 positions shown; findings below may reference images not displayed]

FINDINGS: Heart size is upper normal to mildly enlarged, and is
stable.  There is mild central pulmonary vascular congestion.  No
pulmonary edema is identified.  There is no focal airspace disease,
effusion or pneumothorax.

The trachea is midline.

No acute osseous abnormality is identified.
IMPRESSION: 1.  Borderline cardiomegaly and mild central pulmonary vascular
congestion.
2.  The lungs are clear.

## 2009-03-23 ENCOUNTER — Emergency Department (HOSPITAL_COMMUNITY): Admission: EM | Admit: 2009-03-23 | Discharge: 2009-03-23 | Payer: Self-pay | Admitting: Emergency Medicine

## 2009-08-16 ENCOUNTER — Emergency Department (HOSPITAL_COMMUNITY): Admission: EM | Admit: 2009-08-16 | Discharge: 2009-08-16 | Payer: Self-pay | Admitting: Emergency Medicine

## 2010-06-24 ENCOUNTER — Emergency Department (HOSPITAL_COMMUNITY): Payer: Self-pay

## 2010-06-24 ENCOUNTER — Emergency Department (HOSPITAL_COMMUNITY)
Admission: EM | Admit: 2010-06-24 | Discharge: 2010-06-24 | Disposition: A | Discharge status: Home / Self Care | Payer: Self-pay | Attending: Emergency Medicine | Admitting: Emergency Medicine

## 2010-06-24 DIAGNOSIS — R05 Cough: Secondary | ICD-10-CM | POA: Insufficient documentation

## 2010-06-24 DIAGNOSIS — R059 Cough, unspecified: Secondary | ICD-10-CM | POA: Insufficient documentation

## 2010-06-24 DIAGNOSIS — J069 Acute upper respiratory infection, unspecified: Secondary | ICD-10-CM | POA: Insufficient documentation

## 2010-06-24 DIAGNOSIS — F17200 Nicotine dependence, unspecified, uncomplicated: Secondary | ICD-10-CM | POA: Insufficient documentation

## 2010-06-24 DIAGNOSIS — R071 Chest pain on breathing: Secondary | ICD-10-CM | POA: Insufficient documentation

## 2010-06-24 IMAGING — CR DG CHEST 2V
2 series · 2 of 2 positions shown · non-contrast
Comparison: [DATE] CT, [DATE]

CLINICAL DATA: Fever, cough

CHEST - 2 VIEW

[w chest pa]
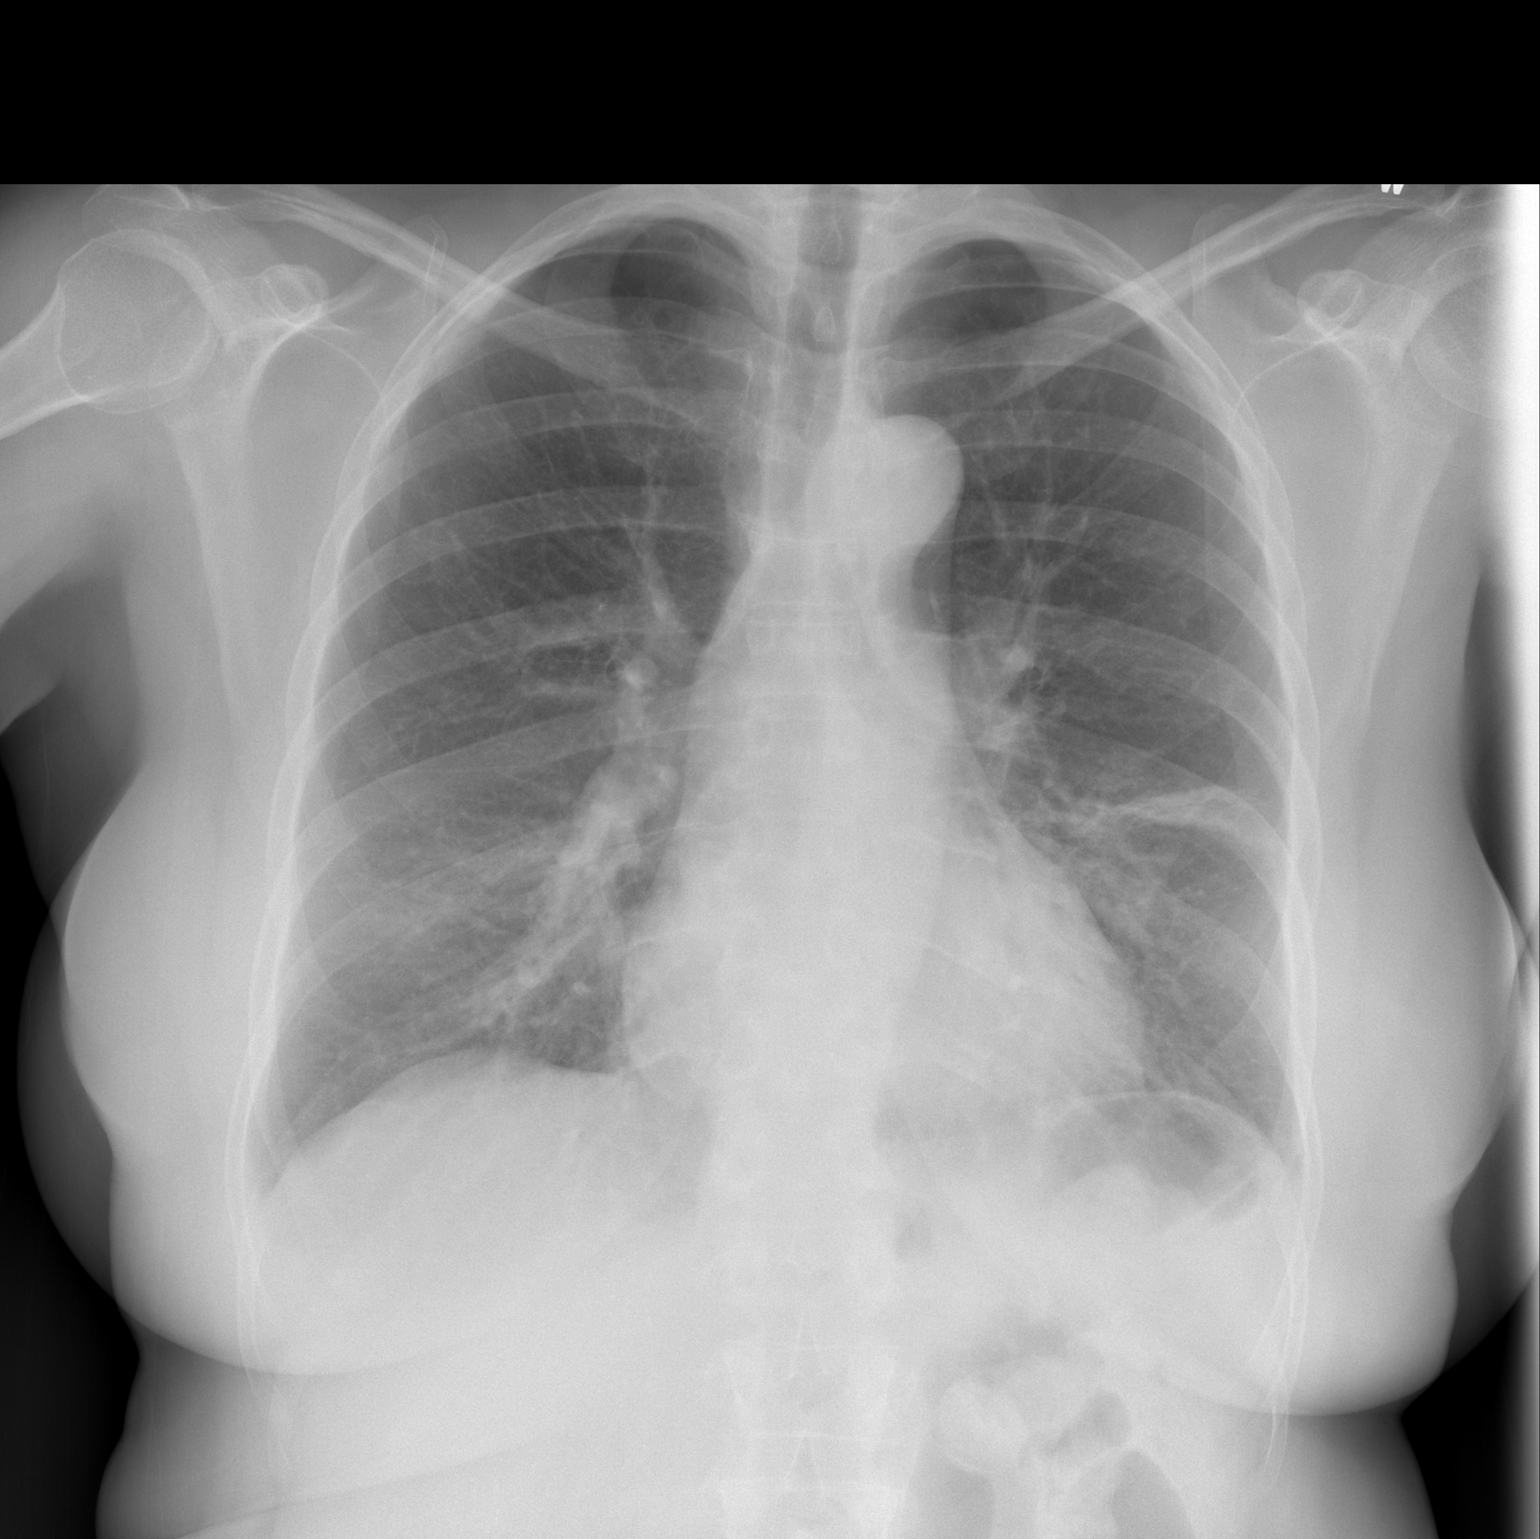

[w chest lat]
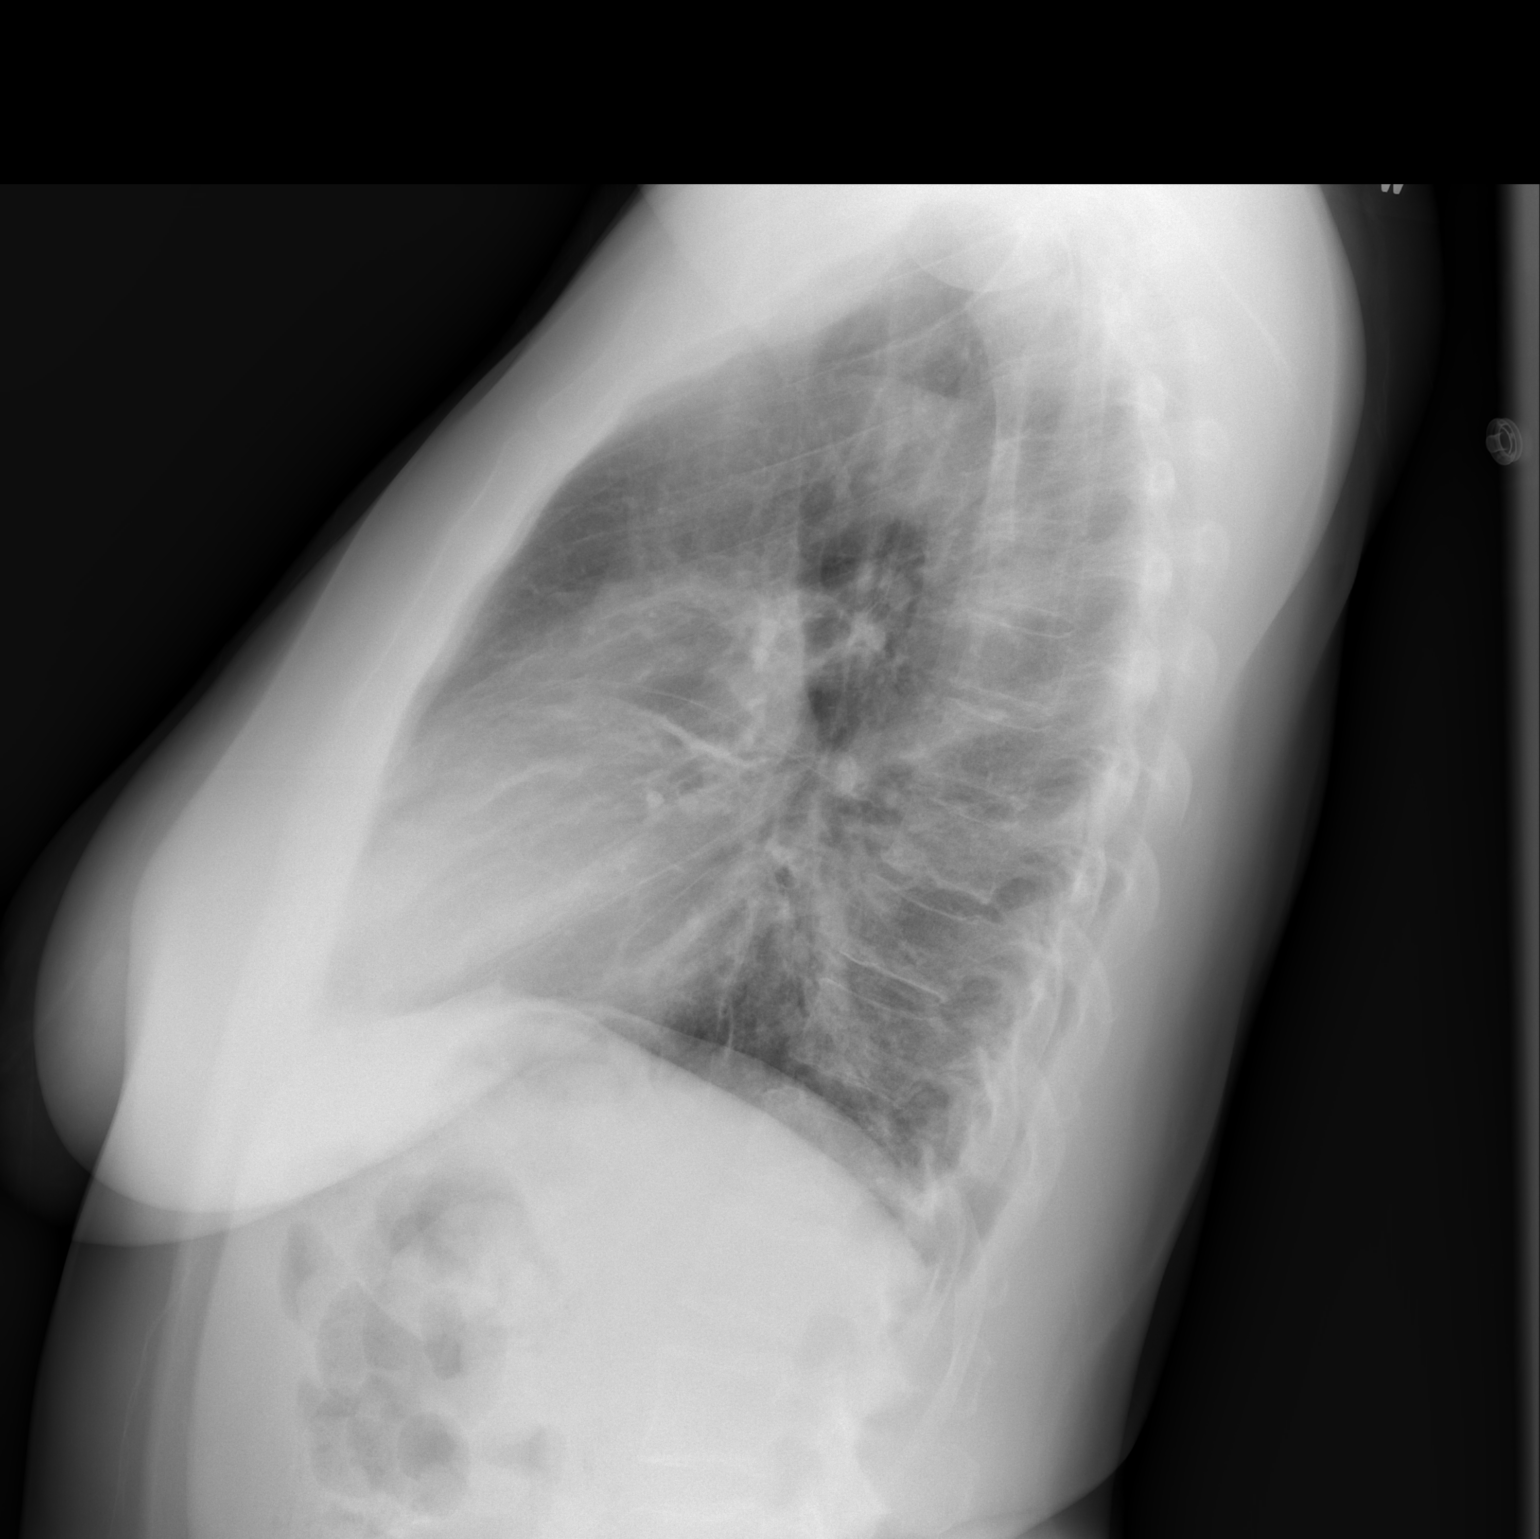

[2 of 2 positions shown; findings below may reference images not displayed]

FINDINGS: Curvilinear left mid lung zone probable atelectasis
noted.  Lungs otherwise grossly clear.  Heart size is normal.  No
pleural effusion.  No acute osseous finding.
IMPRESSION: Curvilinear left lower lobe probable atelectasis, no other focal
acute finding.

## 2010-06-30 ENCOUNTER — Emergency Department (HOSPITAL_COMMUNITY)
Admission: EM | Admit: 2010-06-30 | Discharge: 2010-06-30 | Disposition: A | Discharge status: Home / Self Care | Payer: Self-pay | Attending: Emergency Medicine | Admitting: Emergency Medicine

## 2010-06-30 DIAGNOSIS — J40 Bronchitis, not specified as acute or chronic: Secondary | ICD-10-CM | POA: Insufficient documentation

## 2010-06-30 DIAGNOSIS — R05 Cough: Secondary | ICD-10-CM | POA: Insufficient documentation

## 2010-06-30 DIAGNOSIS — R059 Cough, unspecified: Secondary | ICD-10-CM | POA: Insufficient documentation

## 2010-06-30 DIAGNOSIS — R509 Fever, unspecified: Secondary | ICD-10-CM | POA: Insufficient documentation

## 2010-07-06 LAB — COMPREHENSIVE METABOLIC PANEL
ALT: 14 U/L (ref 0–35)
BUN: 11 mg/dL (ref 6–23)
Sodium: 140 mEq/L (ref 135–145)
Total Bilirubin: 0.6 mg/dL (ref 0.3–1.2)

## 2010-07-06 LAB — DIFFERENTIAL
Eosinophils Absolute: 0.1 10*3/uL (ref 0.0–0.7)
Lymphocytes Relative: 48 % — ABNORMAL HIGH (ref 12–46)
Lymphs Abs: 2.5 10*3/uL (ref 0.7–4.0)
Monocytes Relative: 6 % (ref 3–12)
Neutro Abs: 2.4 10*3/uL (ref 1.7–7.7)

## 2010-07-06 LAB — CBC
HCT: 44.4 % (ref 36.0–46.0)
MCHC: 35.3 g/dL (ref 30.0–36.0)
MCV: 92 fL (ref 78.0–100.0)
Platelets: 269 10*3/uL (ref 150–400)
RDW: 13 % (ref 11.5–15.5)
WBC: 5.3 10*3/uL (ref 4.0–10.5)

## 2010-07-06 LAB — LIPASE, BLOOD: Lipase: 31 U/L (ref 11–59)

## 2010-07-29 LAB — BASIC METABOLIC PANEL
BUN: 10 mg/dL (ref 6–23)
CO2: 24 mEq/L (ref 19–32)
Creatinine, Ser: 0.71 mg/dL (ref 0.4–1.2)
GFR calc Af Amer: >60 mL/min (ref >60)
Potassium: 3.6 mEq/L (ref 3.5–5.1)

## 2010-07-29 LAB — CBC
HCT: 38.8 % (ref 36.0–46.0)
Platelets: 235 10*3/uL (ref 150–400)
RBC: 4.42 MIL/uL (ref 3.87–5.11)
WBC: 5.5 10*3/uL (ref 4.0–10.5)

## 2010-07-29 LAB — RAPID URINE DRUG SCREEN, HOSP PERFORMED
Barbiturates: NOT DETECTED
Cocaine: NOT DETECTED
Opiates: NOT DETECTED
Tetrahydrocannabinol: NOT DETECTED

## 2010-07-29 LAB — URINALYSIS, ROUTINE W REFLEX MICROSCOPIC
Ketones, ur: NEGATIVE mg/dL
Nitrite: NEGATIVE
Protein, ur: NEGATIVE mg/dL
Urobilinogen, UA: 1 mg/dL (ref 0.0–1.0)

## 2010-07-29 LAB — LIPID PANEL
Cholesterol: 115 mg/dL (ref 0–200)
LDL Cholesterol: 74 mg/dL (ref 0–99)
Total CHOL/HDL Ratio: 4.1 RATIO
Triglycerides: 67 mg/dL (ref <150)

## 2010-07-29 LAB — CARDIAC PANEL(CRET KIN+CKTOT+MB+TROPI)
CK, MB: 2.4 ng/mL (ref 0.3–4.0)
CK, MB: 2.5 ng/mL (ref 0.3–4.0)
Total CK: 188 U/L — ABNORMAL HIGH (ref 7–177)
Total CK: 203 U/L — ABNORMAL HIGH (ref 7–177)
Troponin I: <0.01 ng/mL (ref 0.00–0.06)

## 2010-07-29 LAB — CK TOTAL AND CKMB (NOT AT ARMC)
Relative Index: 1.2 (ref 0.0–2.5)
Total CK: 273 U/L — ABNORMAL HIGH (ref 7–177)

## 2010-07-29 LAB — POCT CARDIAC MARKERS
CKMB, poc: 1.6 ng/mL (ref 1.0–8.0)
Myoglobin, poc: 76.6 ng/mL (ref 12–200)

## 2010-07-29 LAB — PROTIME-INR: Prothrombin Time: 13.8 seconds (ref 11.6–15.2)

## 2010-07-29 LAB — POCT PREGNANCY, URINE: Preg Test, Ur: NEGATIVE

## 2010-07-29 LAB — POCT I-STAT, CHEM 8
Chloride: 108 mEq/L (ref 96–112)
Glucose, Bld: 91 mg/dL (ref 70–99)

## 2010-07-29 LAB — MAGNESIUM: Magnesium: 2.1 mg/dL (ref 1.5–2.5)

## 2010-08-31 NOTE — H&P (Signed)
Jessica Cooke, Jessica Cooke              ACCOUNT NO.:  [id number]   MEDICAL RECORD NO.:  [id number]          PATIENT TYPE:  INP   LOCATION:  1235                         FACILITY:  [facility]   PHYSICIAN:  Lonia Blood, M.D.      DATE OF BIRTH:  1960/06/07   DATE OF ADMISSION:  06/29/2008  DATE OF DISCHARGE:                              HISTORY & PHYSICAL   PRIMARY CARE PHYSICIAN:  Dr. Della Goo.   PRESENTING COMPLAINT:  Chest pain.   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  Pain started the day prior to presentation.  It is intermittent,  last about 2 minutes each time.  Mainly on the left side and has been  radiating down her left arm and jaw.  Denied any diaphoresis.  Denied  any nausea, vomiting.  Denied any cough, fever or shortness of breath.  The patient has no prior history of chest pain.  No history of  gastroesophageal reflux disease.  She smokes more than 1 pack per day.  She has strong family history for hypertension but is not hypertensive.  She is, however, obese.   Her past medical history:  Mainly low back pain.   ALLERGIES:  She has no known drug allergies.   MEDICATIONS:  Motrin 800 mg p.r.n.   SOCIAL HISTORY:  She lives in [place].  She is a Lawyer.  She smokes  more than a pack per day.  Denied any alcohol or IV drug use.   FAMILY HISTORY:  Significant for hypertension in both sides of her  family.  No cardiovascular disease.   REVIEW OF SYSTEMS:  A 14-point review of systems is negative except per  HPI.   PHYSICAL EXAMINATION:  Temperature 97.9, blood pressure 132/54, pulse  81, respiratory rate 20, saturations 100% on room air.  GENERAL:  She is awake, alert, oriented in no acute distress.  HEENT:  PERRL.  EOMI.  NECK:  Is supple.  No JVD.  No lymphadenopathy.  RESPIRATORY:  She has good air entry bilaterally.  No wheezes.  No  rales.  CARDIOVASCULAR SYSTEM:  She has S1-S2.  No murmur.  ABDOMEN:  Is soft, nontender with positive bowel sounds.  EXTREMITIES:  Show no edema, cyanosis or clubbing.   LABORATORY DATA:  White count is 5.5, hemoglobin 13.6 with platelet  count of 235.  Sodium 143, potassium 3.5, chloride 108, BUN 11,  creatinine 0.8, glucose 91.  Ionized calcium 1.26.  Initial cardiac  enzymes are negative.  BNP less than 30.  PT 13.8, INR 1.0, PTT 30.  Sodium 142, potassium 3.6, chloride 110, CO2 24, glucose 96, BUN 10,  creatinine 0.71, calcium 9.5.  Her magnesium is 2.1.  Chest x-rays shows  no acute disease but borderline cardiomegaly and mild central pulmonary  vascular congestion.   EKG showed normal sinus rhythm.  No ST-T wave changes.   ASSESSMENT:  Therefore, this is a 50 year old female presenting with  left-sided chest pain radiating down her left arm.  The patient has  relatively moderate risk for coronary artery disease, mainly her tobacco  abuse and obesity.  At this point, we will admit the patient mainly for  cardiac rule out.  She will be an observation.  Once her enzymes are  negative x3, we can confidently discharge the patient.  She may benefit  from outpatient stress test.  Also, counseling on tobacco abuse and the  need for her to lose weight.  Otherwise, the patient will be discharged  as soon as she is cleared.  I do not anticipate cardiology consult at  this point.      Lonia Blood, M.D.  Electronically Signed     LG/MEDQ  D:  06/30/2008  T:  06/30/2008  Job:  [id number]

## 2010-08-31 NOTE — Discharge Summary (Signed)
[name], Jessica Cooke              ACCOUNT NO.:  [id number]   MEDICAL RECORD NO.:  [id number]          PATIENT TYPE:  INP   LOCATION:  1235                         [facility]ACILITY:  [facility]   PHYSICIAN:  Hillery Aldo, M.D.   DATE OF BIRTH:  [birth year]   DATE OF ADMISSION:  06/29/2008  DATE OF DISCHARGE:  06/30/2008                               DISCHARGE SUMMARY   PRIMARY CARE PHYSICIAN:  Dr. Della Goo.   DISCHARGE DIAGNOSES:  1. Atypical chest pain.  2. Chronic lower back pain.  3. Tobacco abuse.  4. Obesity.   DISCHARGE MEDICATIONS:  1. Chantix 0.5 mg daily, titrate up as directed on package insert.  2. Motrin 800 mg q.8 h p.r.n. back pain.   CONSULTATIONS:  None.   BRIEF ADMISSION HPI:  The patient is a 50 year old female who presented  to the hospital with atypical left-sided chest pain that was  nonexertional and short-lived.  Patient was admitted to rule out acute  coronary syndrome given her cardiac risk factors including tobacco abuse  and obesity.  For the full details, please see the dictated report done  by Dr. Mikeal Hawthorne.   PROCEDURES/DIAGNOSTIC STUDIES:  Chest x-ray on June 30, 2008, showed  borderline cardiomegaly and mild central pulmonary vascular congestion.  The lungs were clear.   DISCHARGE LABORATORY VALUES:  Cardiac markers were negative x3 with the  exception of mild CK elevation.  TSH was low at 0.007.  Recommend  outpatient follow-up, repeat testing along with a free T4.  Lipid  profile showed a cholesterol of 115, triglycerides 67, HDL 28, LDL 74.  BNP was less than 30.  Urine drug screen was negative.   HOSPITAL COURSE BY PROBLEM:  1. Atypical chest pain:  The patient was admitted to the telemetry      unit and monitored closely.  Cardiac markers were cycled q.8 h x3      sets and were negative with the exception of mild nonspecific      elevation of the CK.  The patient was further risk stratified with      a fasting lipid panel which did not  reveal any dyslipidemia.  Given      the patient's marked atypical chest pain, lack of dyslipidemia, and      lack of elevated cardiac enzymes, the patient was felt to be at low      risk for acute coronary syndrome.  Patient is encouraged to follow      up with Dr. Lovell Sheehan and have Dr. Lovell Sheehan set up an outpatient      stress test for further evaluation and workup.  2. Low TSH:  The patient did have a TSH drawn which was found to be      slightly low at 0.007.  A free T4 was checked prior to discharge      and the patient will need to follow up with her primary care      physician to determine if there is any evidence of hyperthyroidism.  3. Chronic lower back pain:  The patient will follow up with Dr.  Jenkins.  She can continue to use Motrin as needed.   DISPOSITION:  The patient is medically stable and will be discharged  home.  She is encouraged to follow up with Dr. Lovell Sheehan within 1 week's  time to set up an outpatient stress test and to follow up on the results  of her free T4.      Hillery Aldo, M.D.  Electronically Signed     CR/MEDQ  D:  06/30/2008  T:  06/30/2008  Job:  [id number]   cc:   Della Goo, M.D.  Fax: [phone]

## 2010-09-03 NOTE — Discharge Summary (Signed)
[facility]  Patient:    Jessica Cooke, Jessica Cooke                       MRN: [id number] Adm. Date:  [id number] Disc. Date: [id number] Attending:  Donne Hazel                           Discharge Summary  HISTORY OF PRESENT ILLNESS:  Jessica Cooke is a 50 year old female, gravida 5, para 3, status post bilateral tubal ligation.  The patient was admitted for total vaginal hysterectomy due to symptomatic uterine fibroids.  The patient has been hospitalized twice in the past year for blood transfusions due to severe anemia associated with heavy vaginal bleeding because of her uterine fibroids.  Different treatment options were reviewed, and the patient requested total vaginal hysterectomy with ovarian conservation.  PAST MEDICAL HISTORY: 1. History of smoking. 2. History of blood transfusion. 3. History of anemia.  PAST SURGICAL HISTORY:  Bilateral tubal ligation.  OBSTETRICAL HISTORY: 1. Normal spontaneous vaginal delivery x 3 at term. 2. TAB x 2.  CURRENT MEDICATIONS:  Celexa and iron.  ALLERGIES:  None known.  PHYSICAL EXAMINATION:  Please see clinic admission history and physical.  ADMISSION DIAGNOSES: 1. Symptomatic uterine fibroids. 2. Anemia.  HOSPITAL COURSE:  The patient was admitted on same day of surgery, November 25, 1999, where she underwent a total vaginal hysterectomy.  The surgery went well, and blood loss was 250 cc.  The patients postoperative course was uneventful.  She was slowly advanced to a regular diet, which she was tolerating well prior to discharge.  Her hemoglobin stabilized at 7.3.  She was asymptomatic with this degree of anemia.  She quickly resumed normal bowel and bladder function.  She remained afebrile throughout her hospital course.  She received normal pain medication, did well, and is routinely discharged on postoperative day #2, in stable condition.  She was asymptomatic with this degree of anemia.  DISCHARGE  DIAGNOSES: 1. Symptomatic uterine fibroids, now status post total vaginal hysterectomy. 2. Anemia, stable and asymptomatic.  PLAN: 1. Home. 2. Vicodin 1-2 p.o. q.4h. p.r.n. pain, #40. 3. Continue iron 1-2 p.o. q.d. 4. Routine postoperative instructions given.  No heavy lifting or driving a    car x 2 weeks.  Nothing in the vagina for six weeks. 5. The patient is to follow up in the office in one to two weeks for a routine    postoperative checkup.DD:  12/16/99 TD:  12/17/99 Job: [id number]

## 2010-09-03 NOTE — H&P (Signed)
[facility]  Patient:    Jessica Cooke, Jessica Cooke                       MRN: [id number] Adm. Date:  [id number] Attending:  Donne Hazel                         History and Physical  HISTORY OF PRESENT ILLNESS:  Ms. Conger is a 50 year old female, gravida 5, para 3-0-2, status post bilateral tubal ligation.  The patient is admitted at this time for a vaginal hysterectomy for symptomatic uterine fibroids and anemia.  The patient has been hospitalized x 2 in the past year for blood transfusion due to severe anemia.  Her hemoglobin recently upon admission was 4.3.  The patient was given three units of packed red blood cells for this. She was quickly scheduled for hysterectomy for recurrent anemia because of her abnormal bleeding.  She recently had an ultrasound showing uterine fibroids. A Pap smear was done in the office recently, which was normal.  She also had a mammogram this year, which was normal.  PAST MEDICAL HISTORY: 1. History of smoking. 2. History of blood transfusions.  PAST SURGICAL HISTORY:  Bilateral tubal ligation.  OBSTETRICAL HISTORY: 1. Normal spontaneous vaginal delivery x 3. 2. TAB x 2.  MEDICATIONS:  Celexa and iron.  ALLERGIES:  None known.  PHYSICAL EXAMINATION:  VITAL SIGNS:  Stable, temperature 98.7, respirations 14, pulse 72, blood pressure 198/60.  GENERAL:  She is a well-developed, well-nourished female in no acute distress.  HEENT:  Within normal limits.  NECK:  Supple without adenopathy or thyromegaly.  HEART:  Regular rate and rhythm without murmur, gallop, or rub.  LUNGS:  Clear to auscultation.  BREASTS:  Breast exam done recently in the office was normal but is deferred upon admission.  ABDOMEN:  Soft, benign, without masses, tenderness, hernia, or organomegaly.  EXTREMITIES:  Grossly normal.  NEUROLOGIC:  Grossly normal.  PELVIC:  Normal external female genitalia, vagina and cervix clear.  There  is some bleeding present.  The uterus is enlarged to about eight weeks in size, the adnexa is clear of mass.  ADMITTING DIAGNOSIS: 1. Symptomatic uterine fibroids. 2. Anemia.  PLAN:  Total vaginal hysterectomy with ovarian conservation.  DISCUSSION:  The risks and benefits of surgery were explained to the patient at length.  The risks of bleeding, infection, risk of injury to surrounding organs and pulmonary embolism discussed with the patient.  The goals of surgical therapy reviewed.  Also, the possibility of laparotomy explained to the patient at length.  I have also spoken to the patient on numerous occasions about the possibility of blood transfusion during or around her surgery.  This will be done if the patient has symptoms from anemia.  The risks and benefits of blood transfusion reviewed with her.  Questions were answered.  Ovarian conservation is wished by the patient, and this will be respected. DD:  11/25/99 TD:  11/25/99 Job: [id number]

## 2010-09-03 NOTE — Op Note (Signed)
[facility]  Patient:    Jessica Cooke, Jessica Cooke                       MRN: [id number] Proc. Date: 11/25/99 Adm. Date:  [id number] Attending:  Donne Hazel                           Operative Report  PREOPERATIVE DIAGNOSES: 1. Symptomatic uterine fibroids. 2. Anemia.  POSTOPERATIVE DIAGNOSES: 1. Symptomatic uterine fibroids. 2. Anemia.  PROCEDURE:  Total vaginal hysterectomy.  SURGEON:  Willey Blade, M.D.  ASSISTANT:  Raynald Kemp, M.D.  ANESTHESIA:  General endotracheal.  ESTIMATED BLOOD LOSS:  250 cc.  COMPLICATIONS:  None.  FINDINGS:  At time of surgery, an enlarged uterus consistent with uterine fibroids was encountered.  The ovaries were visualized bilaterally and noted to be normal without pathology.  DESCRIPTION OF PROCEDURE:  The patient was taken to the operating room where a general endotracheal anesthetic was administered.  The patient was placed on the operating table in the dorsal lithotomy position.  The perineum and vagina were prepped and draped in the usual sterile fashion with Betadine and sterile drapes.  A weighted speculum was placed in the posterior fornix of the vagina. The cervix was grasped with a Jacobs tenaculum, and the posterior cul-de-sac was entered sharply and atraumatically.  The uterosacral ligaments were then clamped bilaterally using curved Heaney clamps.  A vascular pedicle was created sharply and suture ligated with a transfixing suture of 0 Vicryl suture.  This suture was then tagged for future use.  A circumferential incision was then made around the anterior portion of the cervix, and a bladder flap was bluntly and sharply created over the lower uterine segment. The bladder was advanced cephalad, and a Deaver placed behind the bladder to ensure its protection during the procedure.  Successive bites were then carried up the body of the uterus including the uterine artery pedicles.  All vascular  pedicles were created very close to the uterine body with curved Heaney clamps.  All vascular pedicles were sharply created and suture ligated with 0 Vicryl suture.  Dissection carried out bilaterally until the fundal region of the uterus was encountered.  Due to the size of the uterus, the uterus was morcellated sharply and removed to reduce the size of the uterus. This was done sharply and safely.  The uterine ovarian ligaments were then encountered and clamped bilaterally with a curved Heaney clamp.  The bowel was packed away with laparotomy packs.  The uterus was dissected free without difficulty.  The uterine ovarian ligaments were doubly ligated, first with a free-tie of 0 Vicryl suture and then a transfixing suture of 0 Vicryl.  This was done bilaterally.  Good hemostasis was noted from the vascular pedicles. Attention was then turned to closure.  All packs were removed.  The uterosacral ligaments were then plicated in the midline to obliterate the posterior cul-de-sac with a transfixing suture of 0 Vicryl.  This was done x 2 sutures.  This was done by placing one suture on the left uterosacral and incorporating the posterior peritoneum and ending with the right uterosacral.  This was then tied in the midline, plicating the uterosacral within the midline and obliterating the posterior cul-de-sac x 2 stitches, as mentioned.  Good hemostasis was then noted.  The vaginal cuff was then closed with multiple interrupted sutures of 0 Monocryl.  This was done in a  vertical fashion. Good reapproximation was noted.  Hemostasis was also noted.  A Foley was then inserted with clear, abundant fluid obtained.  Final sponge, needle and instrument counts correct x 3.  The patient did receive a preoperative antibiotic.  There were no perioperative complications. DD:  11/25/99 TD:  11/25/99 Job: [id number]

## 2010-12-21 ENCOUNTER — Emergency Department (HOSPITAL_COMMUNITY)
Admission: EM | Admit: 2010-12-21 | Discharge: 2010-12-21 | Disposition: A | Discharge status: Home / Self Care | Payer: Self-pay | Attending: Emergency Medicine | Admitting: Emergency Medicine

## 2010-12-21 DIAGNOSIS — F411 Generalized anxiety disorder: Secondary | ICD-10-CM | POA: Insufficient documentation

## 2010-12-21 DIAGNOSIS — IMO0002 Reserved for concepts with insufficient information to code with codable children: Secondary | ICD-10-CM | POA: Insufficient documentation

## 2010-12-21 DIAGNOSIS — T169XXA Foreign body in ear, unspecified ear, initial encounter: Secondary | ICD-10-CM | POA: Insufficient documentation

## 2011-01-10 LAB — POCT CARDIAC MARKERS
CKMB, poc: <1 — ABNORMAL LOW
Myoglobin, poc: 32.7
Operator id: 5362

## 2011-01-10 LAB — CBC
MCHC: 34.8
Platelets: 305
RDW: 13.5

## 2011-01-10 LAB — BASIC METABOLIC PANEL
CO2: 27
Calcium: 9.2
Creatinine, Ser: 0.85
GFR calc Af Amer: >60
GFR calc non Af Amer: >60
Glucose, Bld: 113 — ABNORMAL HIGH

## 2011-01-10 LAB — DIFFERENTIAL
Basophils Absolute: 0
Basophils Relative: 0
Neutro Abs: 2.3
Neutrophils Relative %: 32 — ABNORMAL LOW

## 2011-06-08 ENCOUNTER — Encounter (HOSPITAL_COMMUNITY): Payer: Self-pay | Admitting: Emergency Medicine

## 2011-06-08 ENCOUNTER — Emergency Department (HOSPITAL_COMMUNITY)
Admission: EM | Admit: 2011-06-08 | Discharge: 2011-06-08 | Disposition: A | Discharge status: Home / Self Care | Payer: Self-pay | Attending: Emergency Medicine | Admitting: Emergency Medicine

## 2011-06-08 DIAGNOSIS — L259 Unspecified contact dermatitis, unspecified cause: Secondary | ICD-10-CM | POA: Insufficient documentation

## 2011-06-08 DIAGNOSIS — L309 Dermatitis, unspecified: Secondary | ICD-10-CM

## 2011-06-08 HISTORY — DX: Dermatitis, unspecified: L30.9

## 2011-06-08 MED ORDER — TACROLIMUS 0.03 % EX OINT: TOPICAL_OINTMENT | Freq: Two times a day (BID) | CUTANEOUS | Status: DC

## 2011-06-08 MED ORDER — TRIAMCINOLONE ACETONIDE 0.1 % EX CREA: TOPICAL_CREAM | Freq: Two times a day (BID) | CUTANEOUS | Status: AC

## 2011-06-08 NOTE — ED Notes (Signed)
Pt states that she has ezema and that she thinks its on her face again has had this x2 uses a steroid cream and needs more. Itching no pain

## 2011-06-08 NOTE — ED Provider Notes (Signed)
History     CSN: [id number]  Arrival date & time 06/08/11  4782   First MD Initiated Contact with Patient 06/08/11 [phone]      Chief Complaint  Patient presents with  . Rash    (Consider location/radiation/quality/duration/timing/severity/associated sxs/prior treatment) Patient is a 51 y.o. female presenting with rash. The history is provided by the patient.  Rash  This is a new problem. The current episode started more than 1 week ago. The problem has been gradually worsening. The problem is associated with nothing. There has been no fever. The rash is present on the neck and face. The patient is experiencing no pain.  Pt with hx of eczema. States started with rash to the face and neck several weeks ago, gradually worsening. Hx of the same. States rash is scaly, itching. Has used triamcinolone and tacrolimus cream in the past. Denies fever, chills, URI symtpoms, malaise.  Past Medical History  Diagnosis Date  . Eczema     History reviewed. No pertinent past surgical history.  No family history on file.  History  Substance Use Topics  . Smoking status: Not on file  . Smokeless tobacco: Not on file  . Alcohol Use:     OB History    Grav Para Term Preterm Abortions TAB SAB Ect Mult Living                  Review of Systems  Constitutional: Negative for fever and chills.  HENT: Negative for congestion, sore throat, facial swelling and ear discharge.   Eyes: Negative.   Respiratory: Negative.   Gastrointestinal: Negative.   Genitourinary: Negative.   Musculoskeletal: Negative.   Skin: Positive for rash.  Neurological: Negative.   Psychiatric/Behavioral: Negative.     Allergies  Review of patient's allergies indicates no known allergies.  Home Medications   Current Outpatient Rx  Name Route Sig Dispense Refill  . DIPHENHYDRAMINE-APAP (SLEEP) 25-500 MG PO TABS Oral Take 1 tablet by mouth at bedtime as needed. Pain/sleep    . IBUPROFEN 200 MG PO TABS Oral Take  800 mg by mouth every 6 (six) hours as needed. pain      BP 118/75  Pulse 92  Temp(Src) 98.6 F (37 C) (Oral)  Resp 16  SpO2 96%  Physical Exam  Nursing note and vitals reviewed. Constitutional: She appears well-developed and well-nourished. No distress.  HENT:  Head: Normocephalic.  Eyes: Conjunctivae are normal.  Neck: Neck supple.  Cardiovascular: Normal rate, regular rhythm and normal heart sounds.   Pulmonary/Chest: Effort normal and breath sounds normal. No respiratory distress.  Abdominal: Soft. Bowel sounds are normal.  Musculoskeletal: Normal range of motion.  Skin: Skin is warm and dry. Rash noted.       Erythemous, scaly rash to bilateral cheeks, posterior neck.   Psychiatric: She has a normal mood and affect.    ED Course  Procedures (including critical care time)  Pt with eczema to face, neck. Asking for steroid and tacrolimus cream. Explained to pt steroids not good for face, she stated she used them before. WIll refill her meds. Pt has no systemic symptoms. Will d/c home  No diagnosis found.    MDM          Lottie Mussel, PA 06/08/11 1021

## 2011-06-08 NOTE — ED Provider Notes (Signed)
Medical screening examination/treatment/procedure(s) were performed by non-physician practitioner and as supervising physician I was immediately available for consultation/collaboration.  Doug Sou, MD 06/08/11 1152

## 2011-06-08 NOTE — Discharge Instructions (Signed)
Continue benadryl or hydroxizine for itching. Use moisturizing cream twice a day after washing your face. Use triamcinolone cream as prescribed. If able to afford, apply tacrolimus cream as prescribed. Follow up with your primary care doctor.    Eczema Atopic dermatitis, or eczema, is an inherited type of sensitive skin. Often people with eczema have a family history of allergies, asthma, or hay fever. It causes a red itchy rash and dry scaly skin. The itchiness may occur before the skin rash and may be very intense. It is not contagious. Eczema is generally worse during the cooler winter months and often improves with the warmth of summer. Eczema usually starts showing signs in infancy. Some children outgrow eczema, but it may last through adulthood. Flare-ups may be caused by:  Eating something or contact with something you are sensitive or allergic to.   Stress.  DIAGNOSIS  The diagnosis of eczema is usually based upon symptoms and medical history. TREATMENT  Eczema cannot be cured, but symptoms usually can be controlled with treatment or avoidance of allergens (things to which you are sensitive or allergic to).  Controlling the itching and scratching.   Use over-the-counter antihistamines as directed for itching. It is especially useful at night when the itching tends to be worse.   Use over-the-counter steroid creams as directed for itching.   Scratching makes the rash and itching worse and may cause impetigo (a skin infection) if fingernails are contaminated (dirty).   Keeping the skin well moisturized with creams every day. This will seal in moisture and help prevent dryness. Lotions containing alcohol and water can dry the skin and are not recommended.   Limiting exposure to allergens.   Recognizing situations that cause stress.   Developing a plan to manage stress.  HOME CARE INSTRUCTIONS   Take prescription and over-the-counter medicines as directed by your caregiver.   Do  not use anything on the skin without checking with your caregiver.   Keep baths or showers short (5 minutes) in warm (not hot) water. Use mild cleansers for bathing. You may add non-perfumed bath oil to the bath water. It is best to avoid soap and bubble bath.   Immediately after a bath or shower, when the skin is still damp, apply a moisturizing ointment to the entire body. This ointment should be a petroleum ointment. This will seal in moisture and help prevent dryness. The thicker the ointment the better. These should be unscented.   Keep fingernails cut short and wash hands often. If your child has eczema, it may be necessary to put soft gloves or mittens on your child at night.   Dress in clothes made of cotton or cotton blends. Dress lightly, as heat increases itching.   Avoid foods that may cause flare-ups. Common foods include cow's milk, peanut butter, eggs and wheat.   Keep a child with eczema away from anyone with fever blisters. The virus that causes fever blisters (herpes simplex) can cause a serious skin infection in children with eczema.  SEEK MEDICAL CARE IF:   Itching interferes with sleep.   The rash gets worse or is not better within one week following treatment.   The rash looks infected (pus or soft yellow scabs).   You or your child has an oral temperature above 102 F (38.9 C).   Your baby is older than 3 months with a rectal temperature of 100.5 F (38.1 C) or higher for more than 1 day.   The rash flares up after  contact with someone who has fever blisters.  SEEK IMMEDIATE MEDICAL CARE IF:   Your baby is older than 3 months with a rectal temperature of 102 F (38.9 C) or higher.   Your baby is older than 3 months or younger with a rectal temperature of 100.4 F (38 C) or higher.  Document Released: 04/01/2000 Document Revised: 12/15/2010 Document Reviewed: 02/04/2009 [facility] Patient Information 2012 [place], [place].Eczema Atopic dermatitis, or eczema, is  an inherited type of sensitive skin. Often people with eczema have a family history of allergies, asthma, or hay fever. It causes a red itchy rash and dry scaly skin. The itchiness may occur before the skin rash and may be very intense. It is not contagious. Eczema is generally worse during the cooler winter months and often improves with the warmth of summer. Eczema usually starts showing signs in infancy. Some children outgrow eczema, but it may last through adulthood. Flare-ups may be caused by:  Eating something or contact with something you are sensitive or allergic to.   Stress.  DIAGNOSIS  The diagnosis of eczema is usually based upon symptoms and medical history. TREATMENT  Eczema cannot be cured, but symptoms usually can be controlled with treatment or avoidance of allergens (things to which you are sensitive or allergic to).  Controlling the itching and scratching.   Use over-the-counter antihistamines as directed for itching. It is especially useful at night when the itching tends to be worse.   Use over-the-counter steroid creams as directed for itching.   Scratching makes the rash and itching worse and may cause impetigo (a skin infection) if fingernails are contaminated (dirty).   Keeping the skin well moisturized with creams every day. This will seal in moisture and help prevent dryness. Lotions containing alcohol and water can dry the skin and are not recommended.   Limiting exposure to allergens.   Recognizing situations that cause stress.   Developing a plan to manage stress.  HOME CARE INSTRUCTIONS   Take prescription and over-the-counter medicines as directed by your caregiver.   Do not use anything on the skin without checking with your caregiver.   Keep baths or showers short (5 minutes) in warm (not hot) water. Use mild cleansers for bathing. You may add non-perfumed bath oil to the bath water. It is best to avoid soap and bubble bath.   Immediately after a  bath or shower, when the skin is still damp, apply a moisturizing ointment to the entire body. This ointment should be a petroleum ointment. This will seal in moisture and help prevent dryness. The thicker the ointment the better. These should be unscented.   Keep fingernails cut short and wash hands often. If your child has eczema, it may be necessary to put soft gloves or mittens on your child at night.   Dress in clothes made of cotton or cotton blends. Dress lightly, as heat increases itching.   Avoid foods that may cause flare-ups. Common foods include cow's milk, peanut butter, eggs and wheat.   Keep a child with eczema away from anyone with fever blisters. The virus that causes fever blisters (herpes simplex) can cause a serious skin infection in children with eczema.  SEEK MEDICAL CARE IF:   Itching interferes with sleep.   The rash gets worse or is not better within one week following treatment.   The rash looks infected (pus or soft yellow scabs).   You or your child has an oral temperature above 102 F (38.9 C).  Your baby is older than 3 months with a rectal temperature of 100.5 F (38.1 C) or higher for more than 1 day.   The rash flares up after contact with someone who has fever blisters.  SEEK IMMEDIATE MEDICAL CARE IF:   Your baby is older than 3 months with a rectal temperature of 102 F (38.9 C) or higher.   Your baby is older than 3 months or younger with a rectal temperature of 100.4 F (38 C) or higher.  Document Released: 04/01/2000 Document Revised: 12/15/2010 Document Reviewed: 02/04/2009 [facility] Patient Information 2012 [place], [place].

## 2011-10-01 ENCOUNTER — Emergency Department (HOSPITAL_COMMUNITY): Payer: Self-pay

## 2011-10-01 ENCOUNTER — Encounter (HOSPITAL_COMMUNITY): Payer: Self-pay

## 2011-10-01 ENCOUNTER — Emergency Department (HOSPITAL_COMMUNITY)
Admission: EM | Admit: 2011-10-01 | Discharge: 2011-10-01 | Disposition: A | Discharge status: Home / Self Care | Payer: Self-pay | Attending: Emergency Medicine | Admitting: Emergency Medicine

## 2011-10-01 DIAGNOSIS — M545 Low back pain, unspecified: Secondary | ICD-10-CM | POA: Insufficient documentation

## 2011-10-01 DIAGNOSIS — M7989 Other specified soft tissue disorders: Secondary | ICD-10-CM | POA: Insufficient documentation

## 2011-10-01 DIAGNOSIS — M79609 Pain in unspecified limb: Secondary | ICD-10-CM

## 2011-10-01 DIAGNOSIS — M549 Dorsalgia, unspecified: Secondary | ICD-10-CM

## 2011-10-01 DIAGNOSIS — M79606 Pain in leg, unspecified: Secondary | ICD-10-CM

## 2011-10-01 DIAGNOSIS — F17200 Nicotine dependence, unspecified, uncomplicated: Secondary | ICD-10-CM | POA: Insufficient documentation

## 2011-10-01 LAB — CBC
HCT: 42 % (ref 36.0–46.0)
MCH: 30.9 pg (ref 26.0–34.0)
MCHC: 34 g/dL (ref 30.0–36.0)
MCV: 90.7 fL (ref 78.0–100.0)
RDW: 13.7 % (ref 11.5–15.5)
WBC: 6.8 10*3/uL (ref 4.0–10.5)

## 2011-10-01 LAB — DIFFERENTIAL
Basophils Absolute: 0 10*3/uL (ref 0.0–0.1)
Eosinophils Relative: 2 % (ref 0–5)
Lymphocytes Relative: 52 % — ABNORMAL HIGH (ref 12–46)
Monocytes Absolute: 0.2 10*3/uL (ref 0.1–1.0)

## 2011-10-01 LAB — BASIC METABOLIC PANEL
CO2: 24 mEq/L (ref 19–32)
Calcium: 9.3 mg/dL (ref 8.4–10.5)
Creatinine, Ser: 0.81 mg/dL (ref 0.50–1.10)

## 2011-10-01 IMAGING — CR DG LUMBAR SPINE COMPLETE 4+V
5 series · 5 of 5 positions shown · non-contrast
Comparison: None.

CLINICAL DATA: Lower back pain.

LUMBAR SPINE - COMPLETE 4+ VIEW

[t lumbar spine ap]
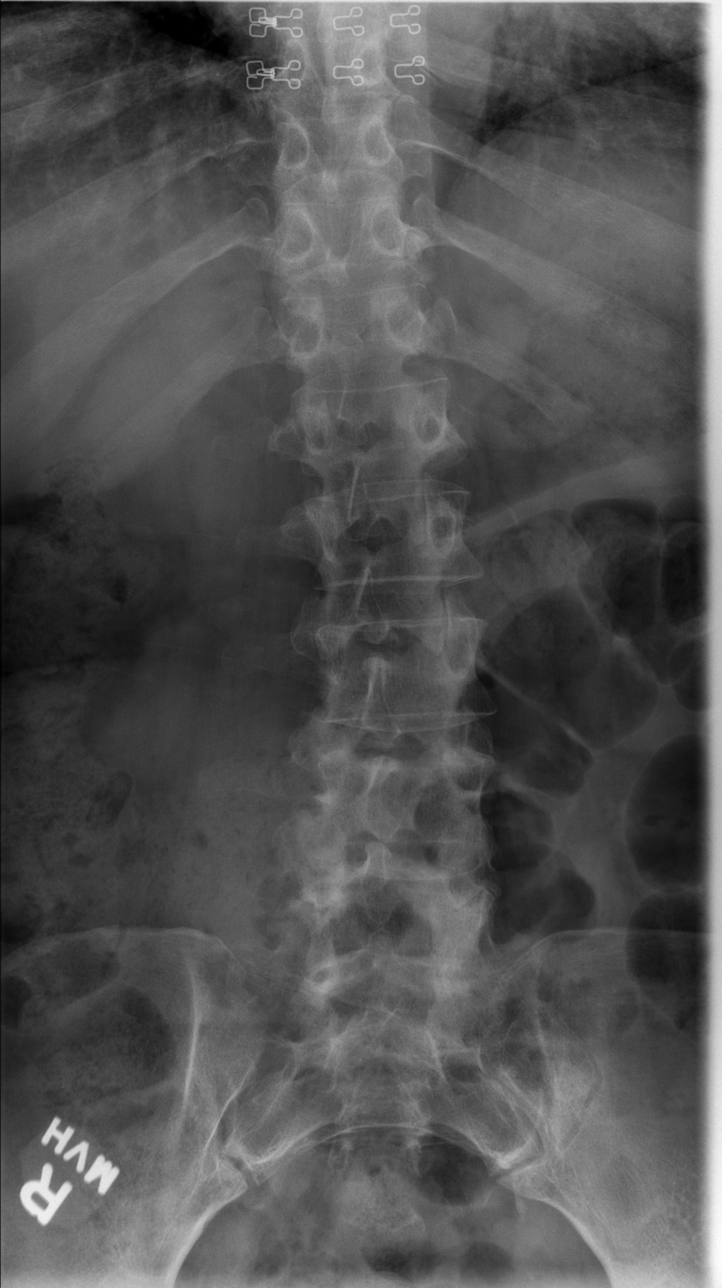

[t lumbar spine obl (1 of 2)]
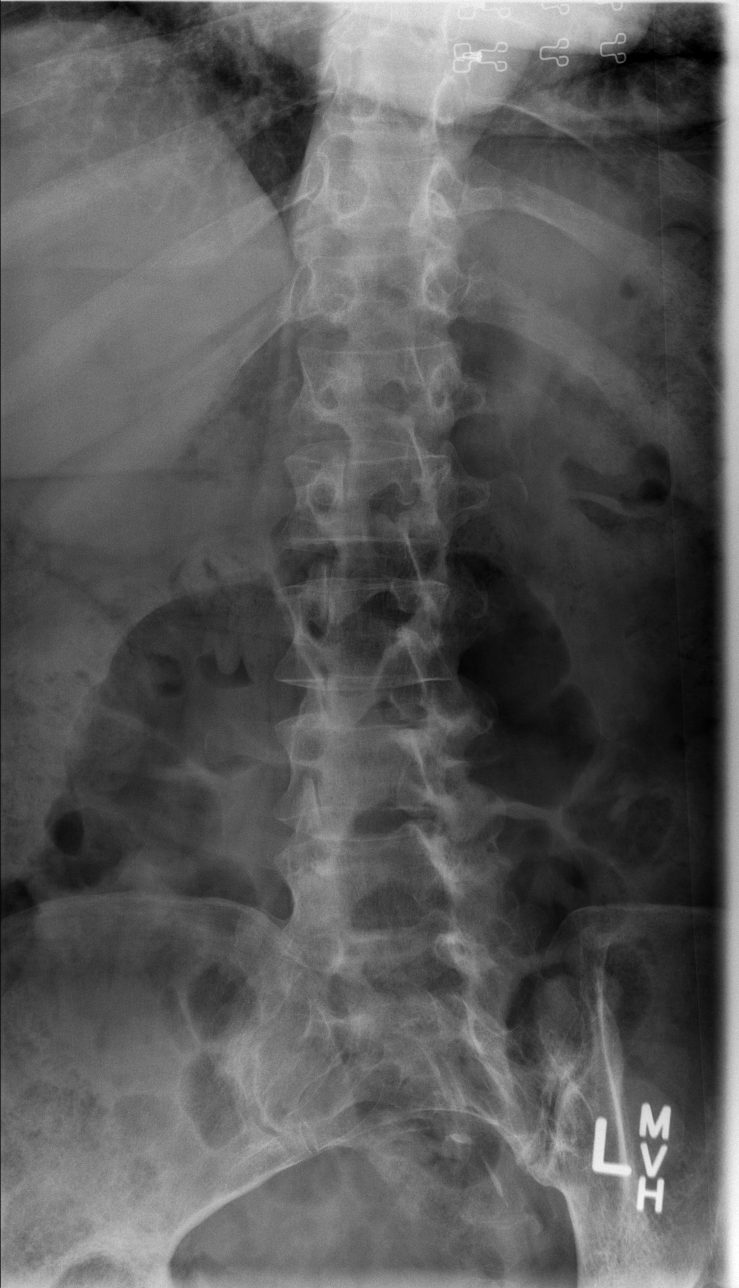

[t lumbar spine obl (2 of 2)]
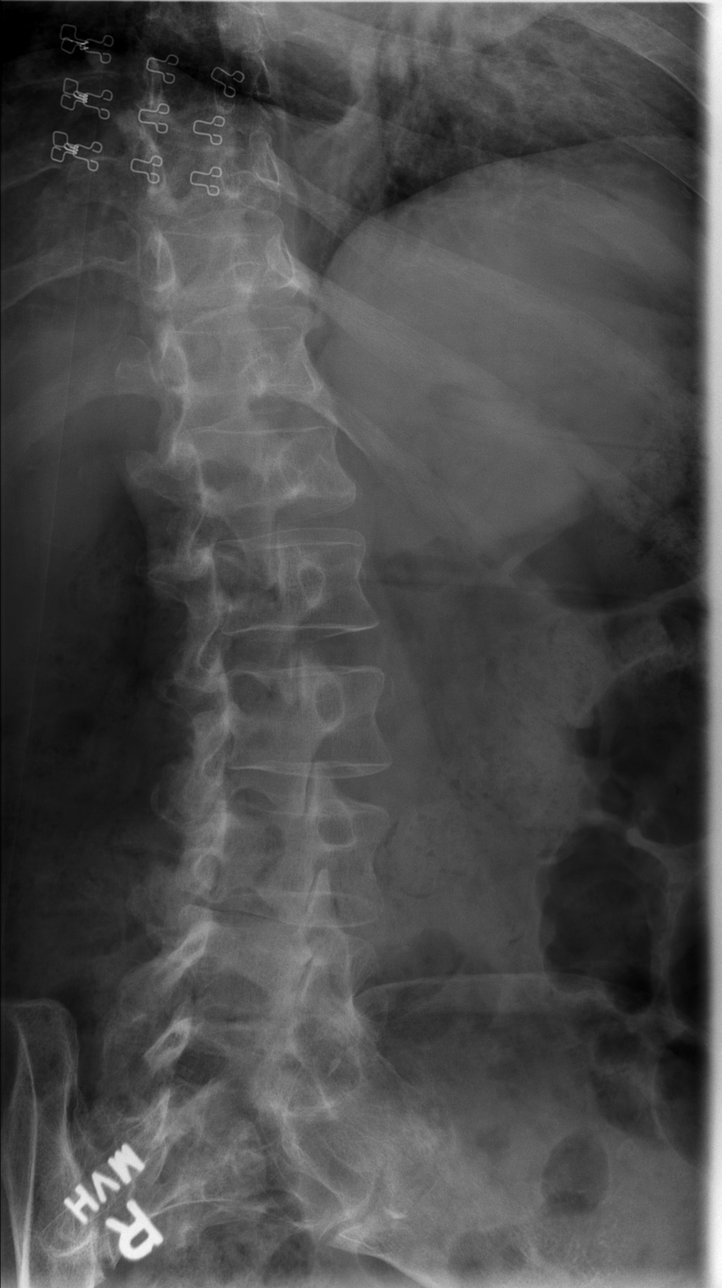

[t lumbar spine lat]
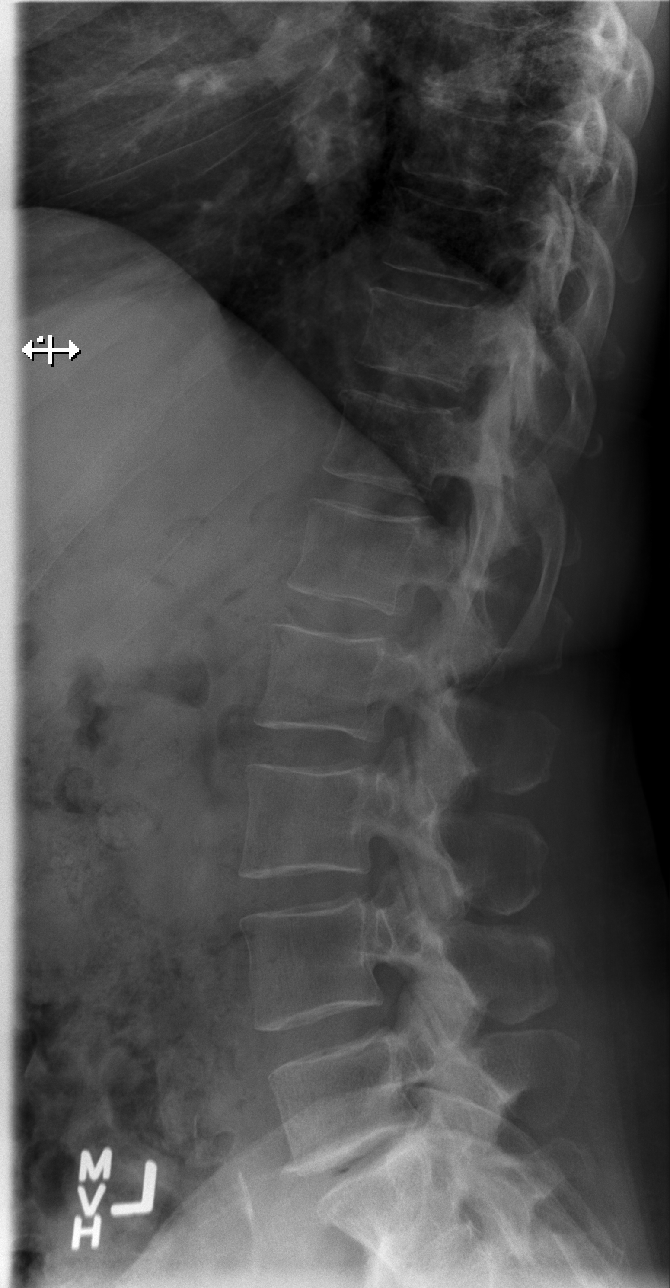

[t lumbar l-5 s-1 spot]
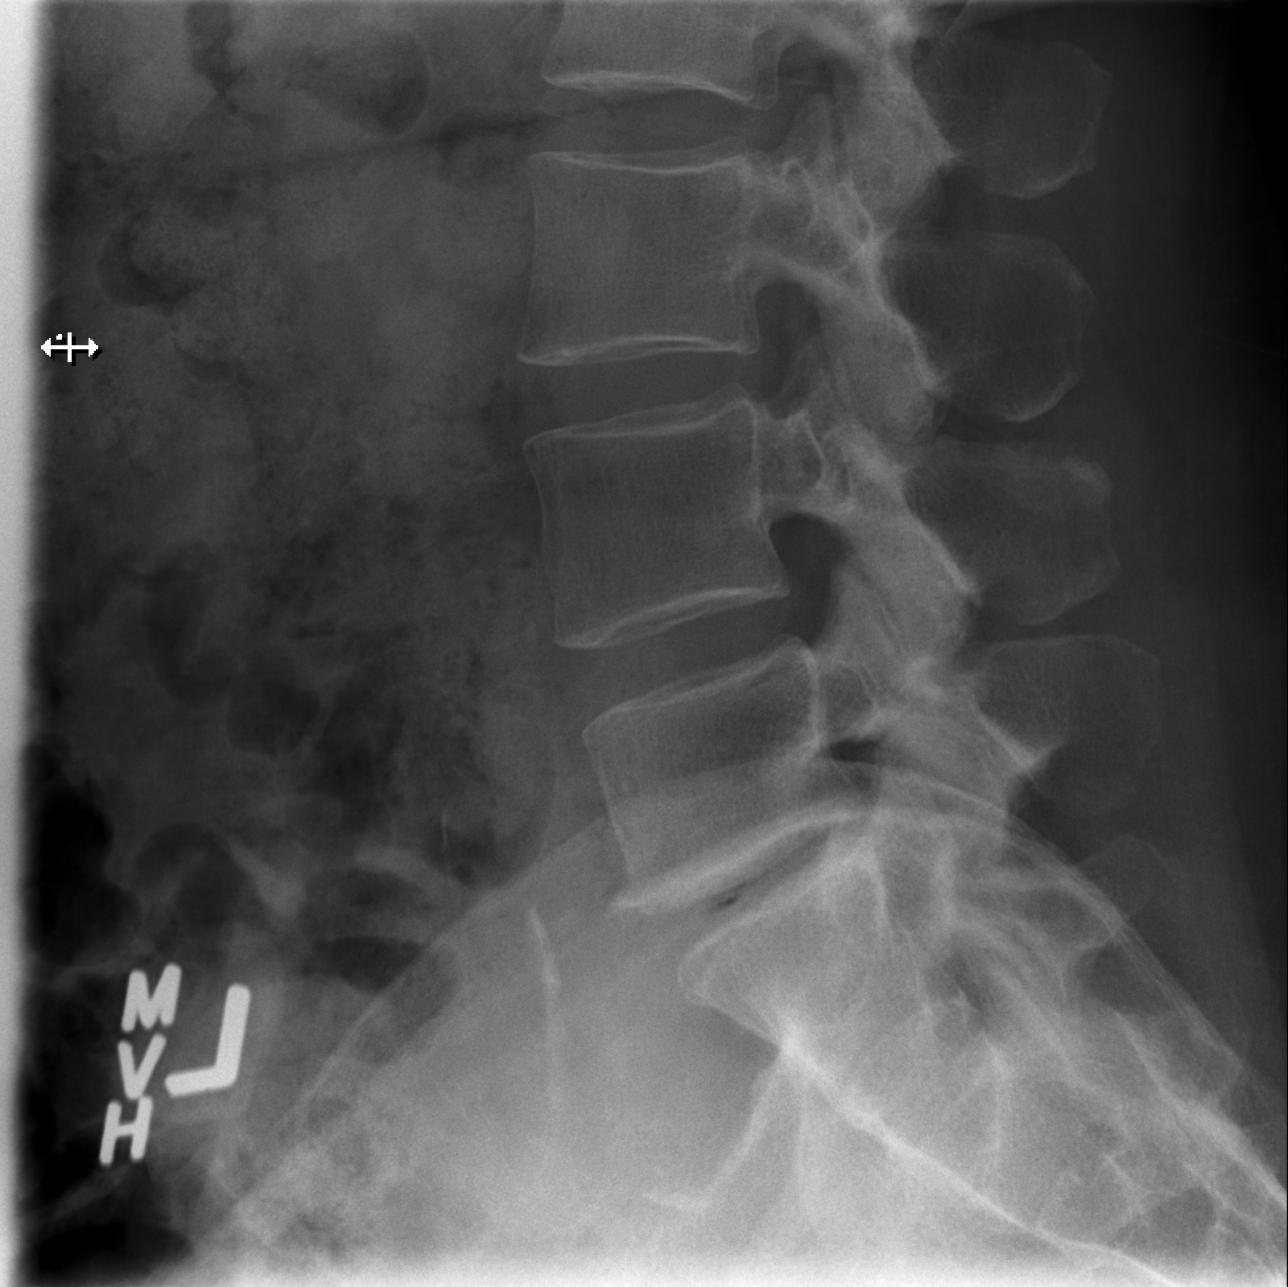

[5 of 5 positions shown; findings below may reference images not displayed]

FINDINGS: There is no evidence of fracture or subluxation.  There
may be a chronic unilateral pars defect on the left side at L5.
Vertebral bodies demonstrate normal height and alignment.
Intervertebral disc spaces are preserved, though there is mild
sclerotic change at L5-S1, with minimal vacuum phenomenon.  The
visualized neural foramina are grossly unremarkable in appearance.

The visualized bowel gas pattern is unremarkable in appearance; air
and stool are noted within the colon.  The sacroiliac joints are
within normal limits.
IMPRESSION: 1.  No evidence of fracture or subluxation along the lumbar spine.
2.  Possible chronic unilateral pars defect on the left side at L5.

## 2011-10-01 MED ORDER — IBUPROFEN 200 MG PO TABS
400.0000 mg | ORAL_TABLET | Freq: Once | ORAL | Status: AC
  Administered 2011-10-01: 400 mg via ORAL
  Filled 2011-10-01: qty 2

## 2011-10-01 NOTE — Progress Notes (Addendum)
VASCULAR LAB PRELIMINARY  PRELIMINARY  PRELIMINARY  PRELIMINARY  Left lower extremity venous Doppler completed.    Preliminary report:  There is no DVT or SVT noted in the left lower extremity.  Incidentally, there is no significant stenosis noted in the left lower extremity arterial system and there are normal waveforms present.  Sherren Kerns Powell, 10/01/2011, 5:57 PM  Gave results to Leotis Shames, RN, and to Dr. Effie Shy.

## 2011-10-01 NOTE — Discharge Instructions (Signed)
Back Exercises   Back exercises help treat and prevent back injuries. The goal of back exercises is to increase the strength of your abdominal and back muscles and the flexibility of your back. These exercises should be started when you no longer have back pain. Back exercises include:   Pelvic Tilt. Lie on your back with your knees bent. Tilt your pelvis until the lower part of your back is against the floor. Hold this position 5 to 10 sec and repeat 5 to 10 times.   Knee to Chest. Pull first 1 knee up against your chest and hold for 20 to 30 seconds, repeat this with the other knee, and then both knees. This may be done with the other leg straight or bent, whichever feels better.   Sit-Ups or Curl-Ups. Bend your knees 90 degrees. Start with tilting your pelvis, and do a partial, slow sit-up, lifting your trunk only 30 to 45 degrees off the floor. Take at least 2 to 3 seconds for each sit-up. Do not do sit-ups with your knees out straight. If partial sit-ups are difficult, simply do the above but with only tightening your abdominal muscles and holding it as directed.   Hip-Lift. Lie on your back with your knees flexed 90 degrees. Push down with your feet and shoulders as you raise your hips a couple inches off the floor; hold for 10 seconds, repeat 5 to 10 times.   Back arches. Lie on your stomach, propping yourself up on bent elbows. Slowly press on your hands, causing an arch in your low back. Repeat 3 to 5 times. Any initial stiffness and discomfort should lessen with repetition over time.   Shoulder-Lifts. Lie face down with arms beside your body. Keep hips and torso pressed to floor as you slowly lift your head and shoulders off the floor.   Do not overdo your exercises, especially in the beginning. Exercises may cause you some mild back discomfort which lasts for a few minutes; however, if the pain is more severe, or lasts for more than 15 minutes, do not continue exercises until you see your caregiver.  Improvement with exercise therapy for back problems is slow.   See your caregivers for assistance with developing a proper back exercise program.   Document Released: 05/12/2004 Document Revised: 03/24/2011 Document Reviewed: 04/04/2005   [employer]® Patient Information ©2012 [employer].     Back Pain, Adult   Low back pain is very common. About 1 in 5 people have back pain. The cause of low back pain is rarely dangerous. The pain often gets better over time. About half of people with a sudden onset of back pain feel better in just 2 weeks. About 8 in 10 people feel better by 6 weeks.   CAUSES   Some common causes of back pain include:   Strain of the muscles or ligaments supporting the spine.   Wear and tear (degeneration) of the spinal discs.   Arthritis.   Direct injury to the back.   DIAGNOSIS   Most of the time, the direct cause of low back pain is not known. However, back pain can be treated effectively even when the exact cause of the pain is unknown. Answering your caregiver's questions about your overall health and symptoms is one of the most accurate ways to make sure the cause of your pain is not dangerous. If your caregiver needs more information, he or she may order lab work or imaging tests (X-rays or MRIs). However, even   if imaging tests show changes in your back, this usually does not require surgery.   HOME CARE INSTRUCTIONS   For many people, back pain returns. Since low back pain is rarely dangerous, it is often a condition that people can learn to manage on their own.   Remain active. It is stressful on the back to sit or stand in one place. Do not sit, drive, or stand in one place for more than 30 minutes at a time. Take short walks on level surfaces as soon as pain allows. Try to increase the length of time you walk each day.   Do not stay in bed. Resting more than 1 or 2 days can delay your recovery.   Do not avoid exercise or work. Your body is made to move. It is not dangerous to be active,  even though your back may hurt. Your back will likely heal faster if you return to being active before your pain is gone.   Pay attention to your body when you bend and lift. Many people have less discomfort when lifting if they bend their knees, keep the load close to their bodies, and avoid twisting. Often, the most comfortable positions are those that put less stress on your recovering back.   Find a comfortable position to sleep. Use a firm mattress and lie on your side with your knees slightly bent. If you lie on your back, put a pillow under your knees.   Only take over-the-counter or prescription medicines as directed by your caregiver. Over-the-counter medicines to reduce pain and inflammation are often the most helpful. Your caregiver may prescribe muscle relaxant drugs. These medicines help dull your pain so you can more quickly return to your normal activities and healthy exercise.   Put ice on the injured area.   Put ice in a plastic bag.   Place a towel between your skin and the bag.   Leave the ice on for 15 to 20 minutes, 3 to 4 times a day for the first 2 to 3 days. After that, ice and heat may be alternated to reduce pain and spasms.   Ask your caregiver about trying back exercises and gentle massage. This may be of some benefit.   Avoid feeling anxious or stressed. Stress increases muscle tension and can worsen back pain. It is important to recognize when you are anxious or stressed and learn ways to manage it. Exercise is a great option.   SEEK MEDICAL CARE IF:   You have pain that is not relieved with rest or medicine.   You have pain that does not improve in 1 week.   You have new symptoms.   You are generally not feeling well.   SEEK IMMEDIATE MEDICAL CARE IF:   You have pain that radiates from your back into your legs.   You develop new bowel or bladder control problems.   You have unusual weakness or numbness in your arms or legs.   You develop nausea or vomiting.   You develop abdominal  pain.   You feel faint.   Document Released: 04/04/2005 Document Revised: 03/24/2011 Document Reviewed: 08/23/2010   [employer]® Patient Information ©2012 [employer].

## 2011-10-01 NOTE — ED Notes (Signed)
Pt states she wants an EKG and CBC, she stated "she wants to rule out everything since she's a heavy smoker".

## 2011-10-01 NOTE — ED Provider Notes (Signed)
History     CSN: [id number]  Arrival date & time 10/01/11  1655   First MD Initiated Contact with Patient 10/01/11 1722      Chief Complaint  Patient presents with  . Leg Pain    (Consider location/radiation/quality/duration/timing/severity/associated sxs/prior treatment) HPI Comments: Jessica Cooke is a 51 y.o. Female who presents for pain in left calf for 5 days. She denies trauma. She also has bilateral hip and low back pain, and occasional numb feeling in both feet. She denies loss of bowel or urinary incontinence, dysuria or urinary frequency. She has no fever, dizziness, weakness, chest pain, or cough. She has had no improvement with the medicines that she is taking at home.  The pain seems to be worse when sitting. She is able to walk without problem.  The history is provided by the patient.    Past Medical History  Diagnosis Date  . Eczema     Past Surgical History  Procedure Date  . Abdominal hysterectomy     No family history on file.  History  Substance Use Topics  . Smoking status: Current Everyday Smoker  . Smokeless tobacco: Not on file  . Alcohol Use: No    OB History    Grav Para Term Preterm Abortions TAB SAB Ect Mult Living                  Review of Systems  All other systems reviewed and are negative.    Allergies  Review of patient's allergies indicates no known allergies.  Home Medications   Current Outpatient Rx  Name Route Sig Dispense Refill  . DIPHENHYDRAMINE-APAP (SLEEP) 25-500 MG PO TABS Oral Take 1 tablet by mouth at bedtime as needed. Pain/sleep    . IBUPROFEN 200 MG PO TABS Oral Take 800 mg by mouth every 6 (six) hours as needed. pain    . WOMENS FORMULA PMS PO Oral Take 1 tablet by mouth daily.    . TRIAMCINOLONE ACETONIDE 0.1 % EX CREA Topical Apply topically 2 (two) times daily. 30 g 0    BP 116/78  Pulse 75  Temp 98 F (36.7 C) (Oral)  Resp 20  SpO2 98%  Physical Exam  Nursing note and vitals  reviewed. Constitutional: She is oriented to person, place, and time. She appears well-developed and well-nourished.  HENT:  Head: Normocephalic and atraumatic.  Eyes: Conjunctivae and EOM are normal. Pupils are equal, round, and reactive to light.  Neck: Normal range of motion and phonation normal. Neck supple.  Cardiovascular: Normal rate, regular rhythm and intact distal pulses.   Pulmonary/Chest: Effort normal and breath sounds normal. She exhibits no tenderness.  Abdominal: Soft. She exhibits no distension. There is no tenderness. There is no guarding.  Musculoskeletal: Normal range of motion. She exhibits edema (Left greater than right).       Mild diffuse back pain, bilaterally. Mild, low back pain to palpation, but normal lumbar motion  Neurological: She is alert and oriented to person, place, and time. She has normal strength. She exhibits normal muscle tone.  Skin: Skin is warm and dry.  Psychiatric: She has a normal mood and affect. Her behavior is normal. Judgment and thought content normal.    ED Course  Procedures (including critical care time)  Labs Reviewed  DIFFERENTIAL - Abnormal; Notable for the following:    Lymphocytes Relative 52 (*)     All other components within normal limits  BASIC METABOLIC PANEL - Abnormal; Notable for the following:  Glucose, Bld 135 (*)     GFR calc non Af Amer 81 (*)     All other components within normal limits  CBC  LAB REPORT - SCANNED   Dg Lumbar Spine Complete  10/01/2011  *RADIOLOGY REPORT*  Clinical Data: Lower back pain.  LUMBAR SPINE - COMPLETE 4+ VIEW  Comparison: None.  Findings: There is no evidence of fracture or subluxation.  There may be a chronic unilateral pars defect on the left side at L5. Vertebral bodies demonstrate normal height and alignment. Intervertebral disc spaces are preserved, though there is mild sclerotic change at L5-S1, with minimal vacuum phenomenon.  The visualized neural foramina are grossly  unremarkable in appearance.  The visualized bowel gas pattern is unremarkable in appearance; air and stool are noted within the colon.  The sacroiliac joints are within normal limits.  IMPRESSION:  1.  No evidence of fracture or subluxation along the lumbar spine. 2.  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  Original Report Authenticated By: Tonia Ghent, M.D.     1. Back pain   2. Leg pain   3. Leg swelling       MDM  Nonspecific back and extremity pain, with left leg. Swelling greater than right. Doubt spinal stenosis, cauda equina syndrome, urinary tract infection, DVT, occult fracture. Patient stable for discharge..   Plan: Home Medications- Advil for pain; Home Treatments- heat to sore areas; Recommended follow up- PCP 1 week for check up        Flint Melter, MD 10/03/11 1526

## 2011-10-01 NOTE — ED Notes (Signed)
Pt in from home states left calf pain x5 days denies recent injury, also c/o pain in bilateral hips with no hx of injury states feelings of pins and needles in feet.

## 2012-02-18 ENCOUNTER — Encounter (HOSPITAL_COMMUNITY): Payer: Self-pay | Admitting: Emergency Medicine

## 2012-02-18 ENCOUNTER — Emergency Department (INDEPENDENT_AMBULATORY_CARE_PROVIDER_SITE_OTHER)
Admission: EM | Admit: 2012-02-18 | Discharge: 2012-02-18 | Disposition: A | Discharge status: Home / Self Care | Payer: Self-pay | Source: Home / Self Care | Attending: Family Medicine | Admitting: Family Medicine

## 2012-02-18 DIAGNOSIS — F411 Generalized anxiety disorder: Secondary | ICD-10-CM

## 2012-02-18 DIAGNOSIS — F419 Anxiety disorder, unspecified: Secondary | ICD-10-CM

## 2012-02-18 MED ORDER — HYDROXYZINE HCL 50 MG PO TABS: 50.0000 mg | ORAL_TABLET | Freq: Three times a day (TID) | ORAL | Status: DC | PRN

## 2012-02-18 MED ORDER — LORAZEPAM 0.5 MG PO TABS: 0.5000 mg | ORAL_TABLET | Freq: Three times a day (TID) | ORAL | Status: DC | PRN

## 2012-02-18 NOTE — ED Notes (Addendum)
Pt c/o anxiety and nervousness. Pt just started a new job and is under a lot stress.  Pt states that she is not able to handle things like she use to.  "needs something to keep calm"  Feels like things are getting to her.   Symptoms have been persistant for over a month.

## 2012-02-23 NOTE — ED Provider Notes (Signed)
History     CSN: [id number]  Arrival date & time 02/18/12  1529   First MD Initiated Contact with Patient 02/18/12 1550      Chief Complaint  Patient presents with  . Anxiety    c/o anxiety. pt states she just recently started  new job under a lot of stress not able to handle things like she use to; feels nervous    (Consider location/radiation/quality/duration/timing/severity/associated sxs/prior treatment) HPI Comments: 51 year old female with history of anxiety and panic attacks. 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 Patient is concerned about having an episode of panic attack she has not had for over a year. She feels stressed and pressured at work and has had episodes where she becomes tearful for no obvious reason. Reports she feels better when she is at home. She is having difficulties to get to sleep at night. Denies suicidal or homicidal ideation. Status she has taken SSRIs in the past. She's currently in the process to get her medical insurance again to visit her primary care provider. Denies history of thyroid disease. Denies palpitations, abdominal pain, shortness of breath, diarrhea or chest pain.   Past Medical History  Diagnosis Date  . Eczema     Past Surgical History  Procedure Date  . Abdominal hysterectomy     History reviewed. No pertinent family history.  History  Substance Use Topics  . Smoking status: Current Every Day Smoker  . Smokeless tobacco: Not on file  . Alcohol Use: No    OB History    Grav Para Term Preterm Abortions TAB SAB Ect Mult Living                  Review of Systems  Constitutional: Negative for fever, chills, appetite change, fatigue and unexpected weight change.  Cardiovascular: Negative for chest pain, palpitations and leg swelling.  Gastrointestinal: Negative for nausea, vomiting, abdominal pain and diarrhea.  Genitourinary:  Negative for dysuria and frequency.  Neurological: Negative for dizziness, tremors, syncope, weakness, numbness and headaches.  Hematological: Negative for adenopathy.  Psychiatric/Behavioral: Positive for sleep disturbance and dysphoric mood. Negative for suicidal ideas, hallucinations, confusion, self-injury and agitation. The patient is nervous/anxious.     Allergies  Review of patient's allergies indicates no known allergies.  Home Medications   Current Outpatient Rx  Name  Route  Sig  Dispense  Refill  . DIPHENHYDRAMINE-APAP (SLEEP) 25-500 MG PO TABS   Oral   Take 1 tablet by mouth at bedtime as needed. Pain/sleep         . HYDROXYZINE HCL 50 MG PO TABS   Oral   Take 1 tablet (50 mg total) by mouth every 8 (eight) hours as needed for anxiety.   20 tablet   0   . IBUPROFEN 200 MG PO TABS   Oral   Take 800 mg by mouth every 6 (six) hours as needed. pain         . LORAZEPAM 0.5 MG PO TABS   Oral   Take 1 tablet (0.5 mg total) by mouth every 8 (eight) hours as needed for anxiety.   10 tablet   0   . WOMENS FORMULA PMS PO   Oral   Take 1 tablet by mouth daily.         . TRIAMCINOLONE ACETONIDE 0.1 % EX CREA   Topical   Apply topically 2 (two) times daily.  30 g   0     BP 122/82  Pulse 84  Temp 98.3 F (36.8 C) (Oral)  Resp 20  SpO2 98%  Physical Exam  Nursing note and vitals reviewed. Constitutional: She is oriented to person, place, and time. She appears well-developed and well-nourished. No distress.  HENT:  Head: Normocephalic and atraumatic.  Eyes: Conjunctivae normal are normal.       No exophthalmos  Neck: Neck supple. No JVD present. No thyromegaly present.  Cardiovascular: Normal rate and regular rhythm.  Exam reveals no gallop.   No murmur heard. Pulmonary/Chest: Effort normal. No respiratory distress. She has no wheezes.  Abdominal: Soft. There is no tenderness.  Lymphadenopathy:    She has no cervical adenopathy.  Neurological: She  is alert and oriented to person, place, and time.  Skin: No rash noted. She is not diaphoretic.  Psychiatric: She has a normal mood and affect. Her behavior is normal. Judgment and thought content normal.    ED Course  Procedures (including critical care time)  Labs Reviewed - No data to display No results found.   1. Anxiety       MDM  Prescribed hydroxyzine. Also Ativan 0.5 mg tablets to take as needed if panic attack. A prescription for 10 tablets of Ativan 0.5 was provided today. Asked to schedule daily walks or activities where she can relax at least once for 10-30 minutes every day. Asked to followup with her primary care provider for followup. Also provided crisis line and [facility] contact information to followup as needed.        Sharin Grave, MD 02/23/12 [id number]

## 2012-07-18 ENCOUNTER — Ambulatory Visit (HOSPITAL_COMMUNITY)
Admission: RE | Admit: 2012-07-18 | Discharge: 2012-07-18 | Disposition: A | Discharge status: Home / Self Care | Payer: BC Managed Care – PPO | Source: Ambulatory Visit | Attending: Internal Medicine | Admitting: Internal Medicine

## 2012-07-18 ENCOUNTER — Other Ambulatory Visit (HOSPITAL_COMMUNITY): Payer: Self-pay | Admitting: Internal Medicine

## 2012-07-18 DIAGNOSIS — F17200 Nicotine dependence, unspecified, uncomplicated: Secondary | ICD-10-CM | POA: Insufficient documentation

## 2012-07-18 DIAGNOSIS — R05 Cough: Secondary | ICD-10-CM | POA: Insufficient documentation

## 2012-07-18 DIAGNOSIS — R059 Cough, unspecified: Secondary | ICD-10-CM | POA: Insufficient documentation

## 2012-07-18 IMAGING — CR DG CHEST 2V
2 series · 2 of 2 positions shown · non-contrast
Comparison: [DATE]

CLINICAL DATA: Productive cough, smoker

CHEST - 2 VIEW

[w chest pa]
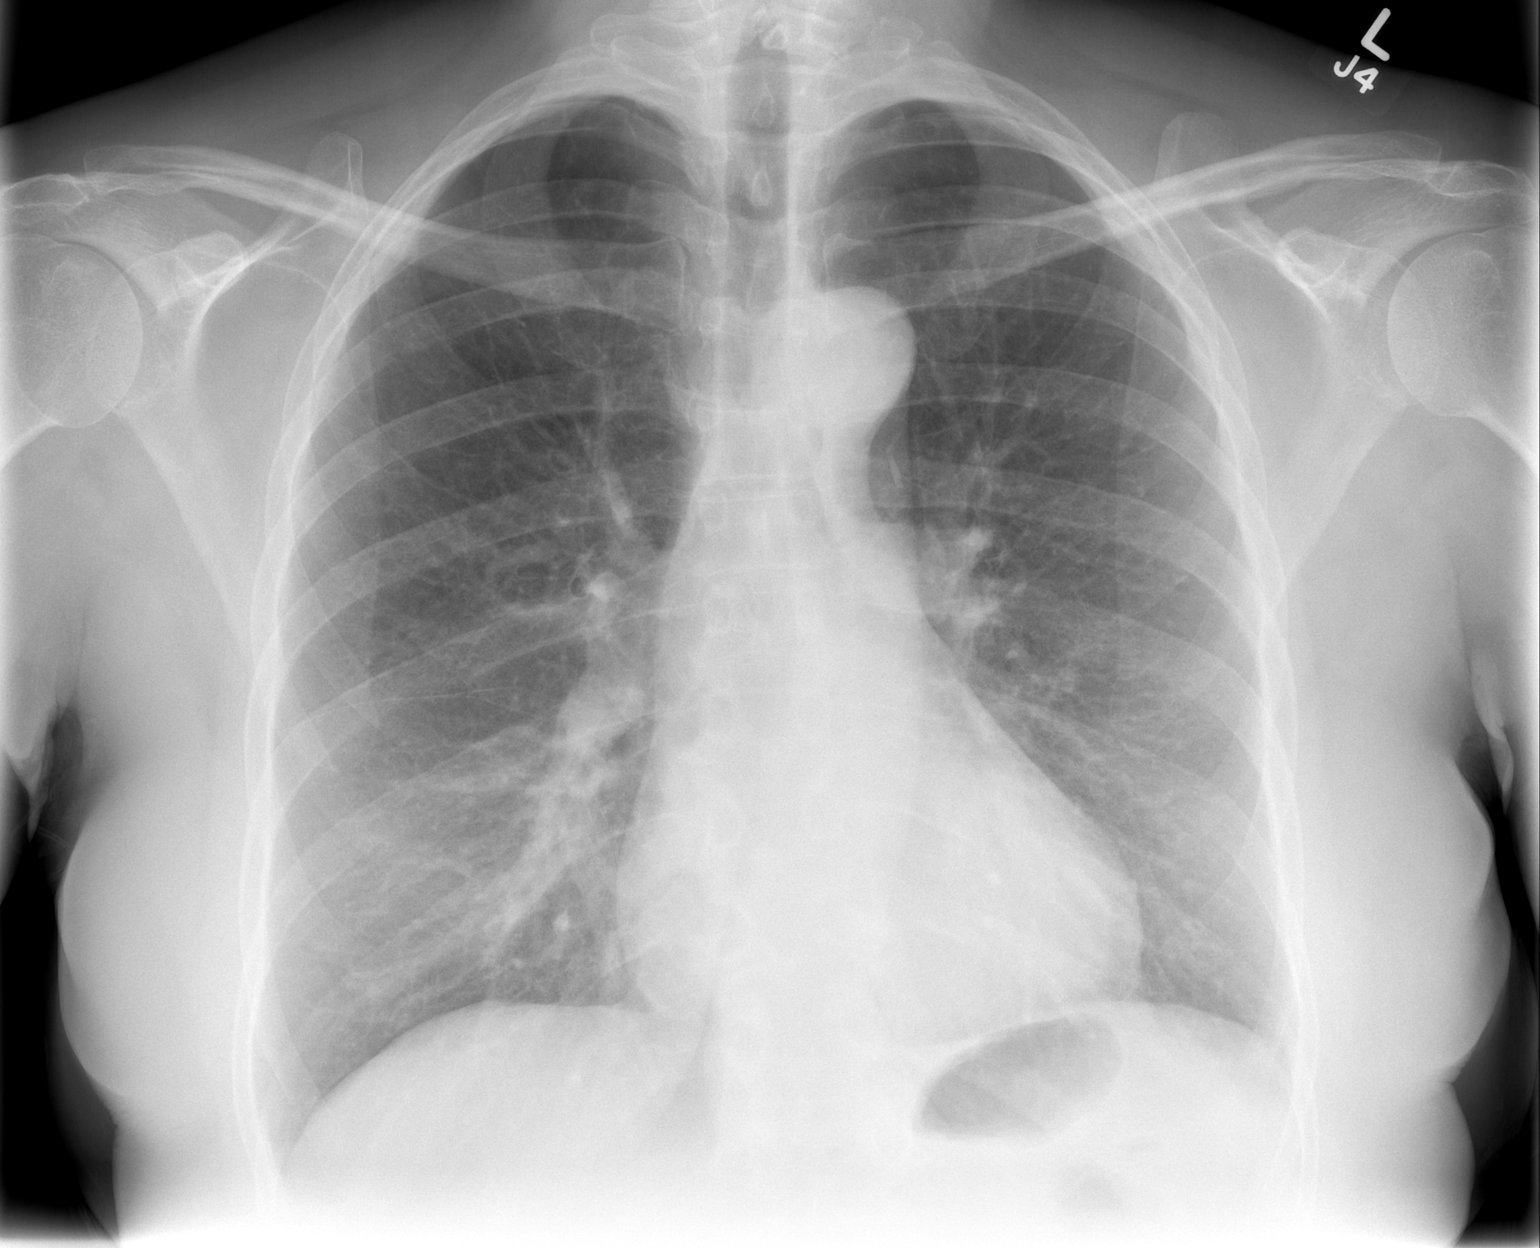

[w chest lat]
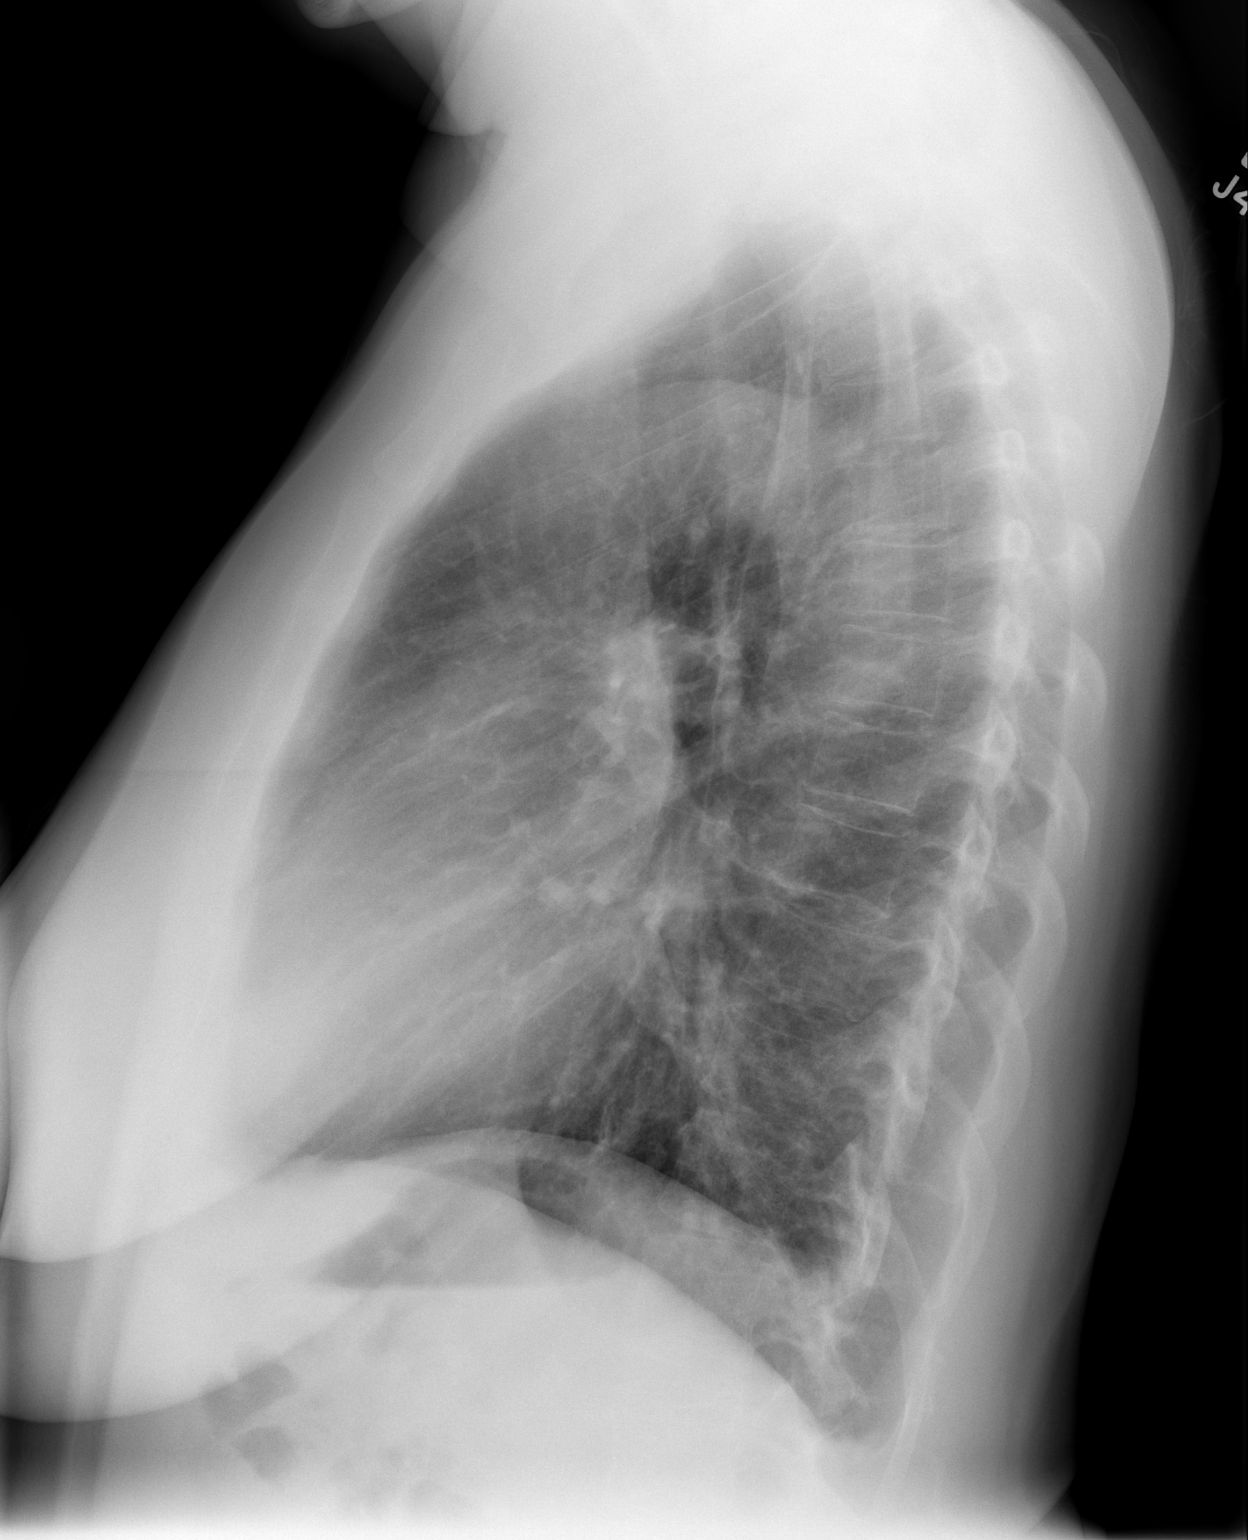

[2 of 2 positions shown; findings below may reference images not displayed]

FINDINGS: Upper-normal size of cardiac silhouette.
Mildly tortuous thoracic aorta.
Pulmonary vascularity mediastinal contours otherwise normal.
Mild chronic peribronchial thickening and slight hyperinflation.
Subsegmental atelectasis right middle lobe.
No acute infiltrate, pleural effusion, or pneumothorax.
Osseous structures unremarkable.
IMPRESSION: Mild chronic peribronchial thickening and slight hyperinflation
could represent COPD or asthma.
Minimal subsegmental atelectasis right middle lobe.

## 2012-11-01 ENCOUNTER — Emergency Department (HOSPITAL_COMMUNITY)
Admission: EM | Admit: 2012-11-01 | Discharge: 2012-11-01 | Disposition: A | Discharge status: Home / Self Care | Payer: BC Managed Care – PPO | Source: Home / Self Care | Attending: Family Medicine | Admitting: Family Medicine

## 2012-11-01 DIAGNOSIS — R5383 Other fatigue: Secondary | ICD-10-CM

## 2012-11-01 DIAGNOSIS — R5381 Other malaise: Secondary | ICD-10-CM

## 2012-11-01 LAB — POCT I-STAT, CHEM 8
Chloride: 107 mEq/L (ref 96–112)
HCT: 45 % (ref 36.0–46.0)
Potassium: 3.7 mEq/L (ref 3.5–5.1)
Sodium: 144 mEq/L (ref 135–145)

## 2012-11-01 NOTE — ED Notes (Signed)
Patient states she overall don't feel good all this week but worst last night.  Patient states she is dizzy and has a high grade temp.   Patient states she has taken ambien 5 mg this morning around 3.  Tylenol was taken last night.

## 2012-11-01 NOTE — ED Provider Notes (Signed)
History    CSN: [id number] Arrival date & time 11/01/12  1302  First MD Initiated Contact with Patient 11/01/12 1408     Chief Complaint  Patient presents with  . Dizziness   (Consider location/radiation/quality/duration/timing/severity/associated sxs/prior Treatment) Patient is a 52 y.o. female presenting with URI. The history is provided by the patient.  URI Presenting symptoms: congestion, fatigue and fever   Presenting symptoms: no rhinorrhea and no sore throat   Severity:  Mild Onset quality:  Gradual Duration:  2 days Progression:  Unchanged Chronicity:  New Relieved by:  Nothing Worsened by:  Nothing tried Associated symptoms: myalgias   Associated symptoms: no headaches and no sinus pain   Associated symptoms comment:  Just don't feel good. Risk factors: no recent illness    Past Medical History  Diagnosis Date  . Eczema    Past Surgical History  Procedure Laterality Date  . Abdominal hysterectomy     No family history on file. History  Substance Use Topics  . Smoking status: Current Every Day Smoker  . Smokeless tobacco: Not on file  . Alcohol Use: No   OB History   Grav Para Term Preterm Abortions TAB SAB Ect Mult Living                 Review of Systems  Constitutional: Positive for fever and fatigue.  HENT: Positive for congestion. Negative for sore throat and rhinorrhea.   Respiratory: Negative.   Cardiovascular: Negative.   Gastrointestinal: Negative.   Genitourinary: Negative.   Musculoskeletal: Positive for myalgias.  Neurological: Negative for headaches.    Allergies  Review of patient's allergies indicates no known allergies.  Home Medications   Current Outpatient Rx  Name  Route  Sig  Dispense  Refill  . diphenhydramine-acetaminophen (TYLENOL PM) 25-500 MG TABS   Oral   Take 1 tablet by mouth at bedtime as needed. Pain/sleep         . hydrOXYzine (ATARAX/VISTARIL) 50 MG tablet   Oral   Take 1 tablet (50 mg total) by mouth  every 8 (eight) hours as needed for anxiety.   20 tablet   0   . ibuprofen (ADVIL,MOTRIN) 200 MG tablet   Oral   Take 800 mg by mouth every 6 (six) hours as needed. pain         . LORazepam (ATIVAN) 0.5 MG tablet   Oral   Take 1 tablet (0.5 mg total) by mouth every 8 (eight) hours as needed for anxiety.   10 tablet   0   . Nutritional Supplements (WOMENS FORMULA PMS PO)   Oral   Take 1 tablet by mouth daily.          BP 135/72  Pulse 70  Temp(Src) 98.4 F (36.9 C) (Oral)  Resp 18  SpO2 99% Physical Exam  Nursing note and vitals reviewed. Constitutional: She is oriented to person, place, and time. She appears well-developed and well-nourished.  HENT:  Head: Normocephalic.  Right Ear: External ear normal.  Left Ear: External ear normal.  Mouth/Throat: Oropharynx is clear and moist.  Eyes: EOM are normal. Pupils are equal, round, and reactive to light.  Neck: Normal range of motion. Neck supple.  Cardiovascular: Normal rate and regular rhythm.   Pulmonary/Chest: Effort normal and breath sounds normal.  Abdominal: Soft. Bowel sounds are normal.  Lymphadenopathy:    She has no cervical adenopathy.  Neurological: She is alert and oriented to person, place, and time.  Skin: Skin is warm  and dry.    ED Course  Procedures (including critical care time) Labs Reviewed  POCT I-STAT, CHEM 8 - Abnormal; Notable for the following:    Glucose, Bld 109 (*)    Hemoglobin 15.3 (*)    All other components within normal limits   No results found. 1. Fatigue     MDM  i-stat wnl.  Linna Hoff, MD 11/01/12 1536

## 2013-10-05 ENCOUNTER — Emergency Department (HOSPITAL_COMMUNITY)
Admission: EM | Admit: 2013-10-05 | Discharge: 2013-10-05 | Disposition: A | Discharge status: Home / Self Care | Payer: BC Managed Care – PPO | Attending: Emergency Medicine | Admitting: Emergency Medicine

## 2013-10-05 ENCOUNTER — Encounter (HOSPITAL_COMMUNITY): Payer: Self-pay | Admitting: Emergency Medicine

## 2013-10-05 ENCOUNTER — Emergency Department (HOSPITAL_COMMUNITY): Payer: BC Managed Care – PPO

## 2013-10-05 DIAGNOSIS — R5383 Other fatigue: Secondary | ICD-10-CM

## 2013-10-05 DIAGNOSIS — Z9119 Patient's noncompliance with other medical treatment and regimen: Secondary | ICD-10-CM | POA: Insufficient documentation

## 2013-10-05 DIAGNOSIS — Z872 Personal history of diseases of the skin and subcutaneous tissue: Secondary | ICD-10-CM | POA: Insufficient documentation

## 2013-10-05 DIAGNOSIS — F17200 Nicotine dependence, unspecified, uncomplicated: Secondary | ICD-10-CM | POA: Insufficient documentation

## 2013-10-05 DIAGNOSIS — Z7982 Long term (current) use of aspirin: Secondary | ICD-10-CM | POA: Insufficient documentation

## 2013-10-05 DIAGNOSIS — Z79899 Other long term (current) drug therapy: Secondary | ICD-10-CM | POA: Insufficient documentation

## 2013-10-05 DIAGNOSIS — Z91199 Patient's noncompliance with other medical treatment and regimen due to unspecified reason: Secondary | ICD-10-CM | POA: Insufficient documentation

## 2013-10-05 DIAGNOSIS — R638 Other symptoms and signs concerning food and fluid intake: Secondary | ICD-10-CM | POA: Insufficient documentation

## 2013-10-05 DIAGNOSIS — R5381 Other malaise: Secondary | ICD-10-CM | POA: Insufficient documentation

## 2013-10-05 DIAGNOSIS — E039 Hypothyroidism, unspecified: Secondary | ICD-10-CM

## 2013-10-05 HISTORY — DX: Disorder of thyroid, unspecified: E07.9

## 2013-10-05 LAB — CBC WITH DIFFERENTIAL/PLATELET
BASOS PCT: 0 % (ref 0–1)
Basophils Absolute: 0 10*3/uL (ref 0.0–0.1)
EOS ABS: 0.1 10*3/uL (ref 0.0–0.7)
EOS PCT: 2 % (ref 0–5)
HCT: 42.4 % (ref 36.0–46.0)
Hemoglobin: 14.5 g/dL (ref 12.0–15.0)
Lymphocytes Relative: 55 % — ABNORMAL HIGH (ref 12–46)
Lymphs Abs: 3.2 10*3/uL (ref 0.7–4.0)
MCH: 31.2 pg (ref 26.0–34.0)
MCHC: 34.2 g/dL (ref 30.0–36.0)
MCV: 91.2 fL (ref 78.0–100.0)
Monocytes Absolute: 0.3 10*3/uL (ref 0.1–1.0)
Monocytes Relative: 5 % (ref 3–12)
NEUTROS PCT: 38 % — AB (ref 43–77)
Neutro Abs: 2.2 10*3/uL (ref 1.7–7.7)
PLATELETS: 330 10*3/uL (ref 150–400)
RBC: 4.65 MIL/uL (ref 3.87–5.11)
RDW: 13.7 % (ref 11.5–15.5)
WBC: 5.8 10*3/uL (ref 4.0–10.5)

## 2013-10-05 LAB — COMPREHENSIVE METABOLIC PANEL
ALBUMIN: 4.3 g/dL (ref 3.5–5.2)
ALK PHOS: 57 U/L (ref 39–117)
ALT: 13 U/L (ref 0–35)
AST: 17 U/L (ref 0–37)
BUN: 10 mg/dL (ref 6–23)
CALCIUM: 9.9 mg/dL (ref 8.4–10.5)
CO2: 24 mEq/L (ref 19–32)
Chloride: 105 mEq/L (ref 96–112)
Creatinine, Ser: 0.9 mg/dL (ref 0.50–1.10)
GFR calc non Af Amer: 71 mL/min — ABNORMAL LOW (ref >90)
GFR, EST AFRICAN AMERICAN: 82 mL/min — AB (ref >90)
GLUCOSE: 98 mg/dL (ref 70–99)
POTASSIUM: 3.6 meq/L — AB (ref 3.7–5.3)
SODIUM: 141 meq/L (ref 137–147)
TOTAL PROTEIN: 7.2 g/dL (ref 6.0–8.3)
Total Bilirubin: 0.3 mg/dL (ref 0.3–1.2)

## 2013-10-05 LAB — TSH: TSH: 7.39 u[IU]/mL — ABNORMAL HIGH (ref 0.350–4.500)

## 2013-10-05 LAB — URINALYSIS, ROUTINE W REFLEX MICROSCOPIC
Bilirubin Urine: NEGATIVE
GLUCOSE, UA: NEGATIVE mg/dL
Hgb urine dipstick: NEGATIVE
KETONES UR: NEGATIVE mg/dL
LEUKOCYTES UA: NEGATIVE
NITRITE: NEGATIVE
PROTEIN: NEGATIVE mg/dL
Specific Gravity, Urine: 1.021 (ref 1.005–1.030)
UROBILINOGEN UA: 1 mg/dL (ref 0.0–1.0)
pH: 5.5 (ref 5.0–8.0)

## 2013-10-05 LAB — TROPONIN I

## 2013-10-05 IMAGING — CR DG ANKLE COMPLETE 3+V*L*
3 series · 3 of 3 positions shown · non-contrast
Comparison: None.

CLINICAL DATA: Left ankle pain.

EXAM:
LEFT ANKLE COMPLETE - 3+ VIEW

[x ankle ap left]
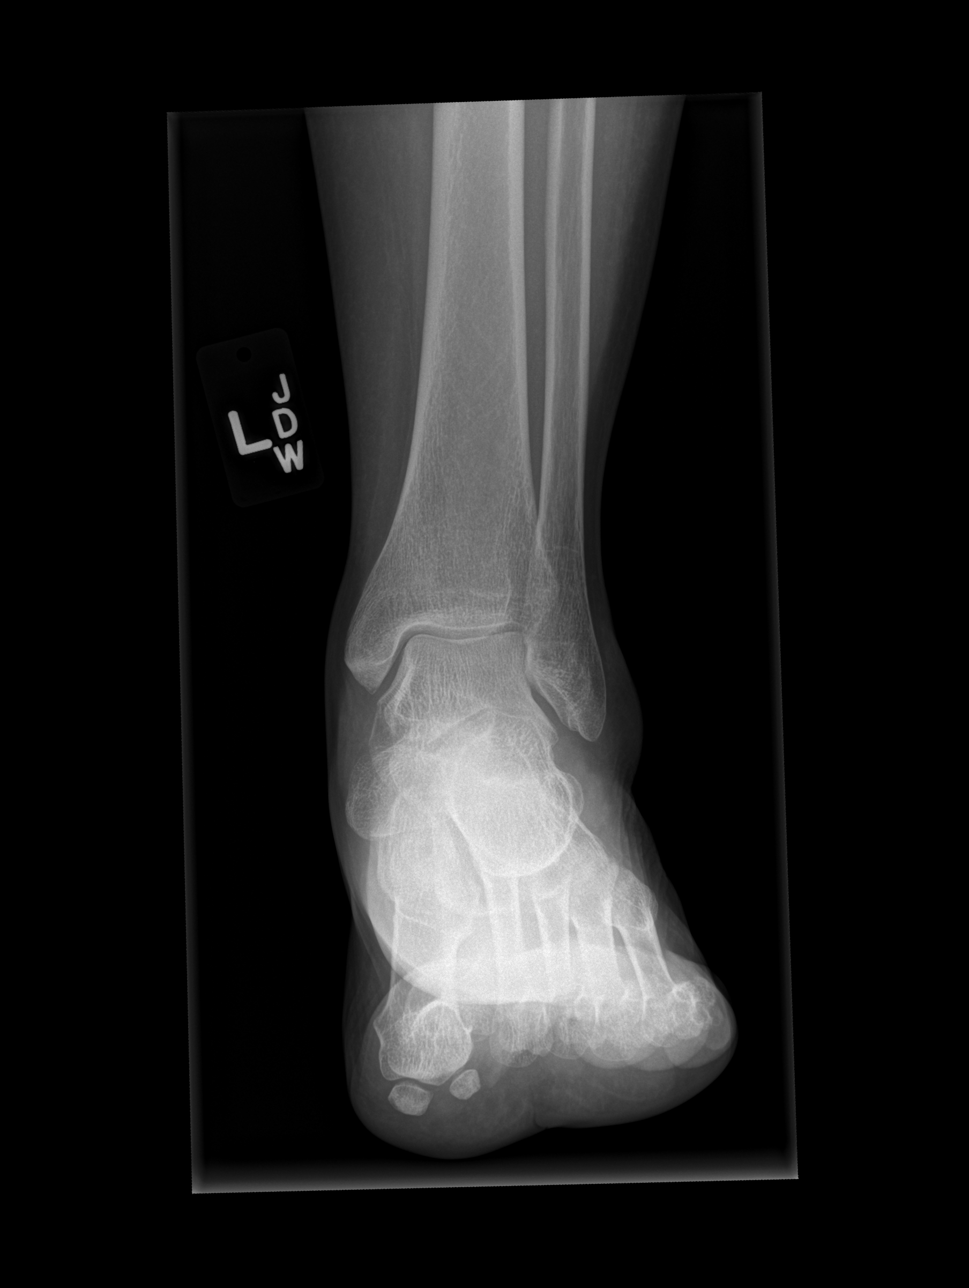

[x ankle obl left]
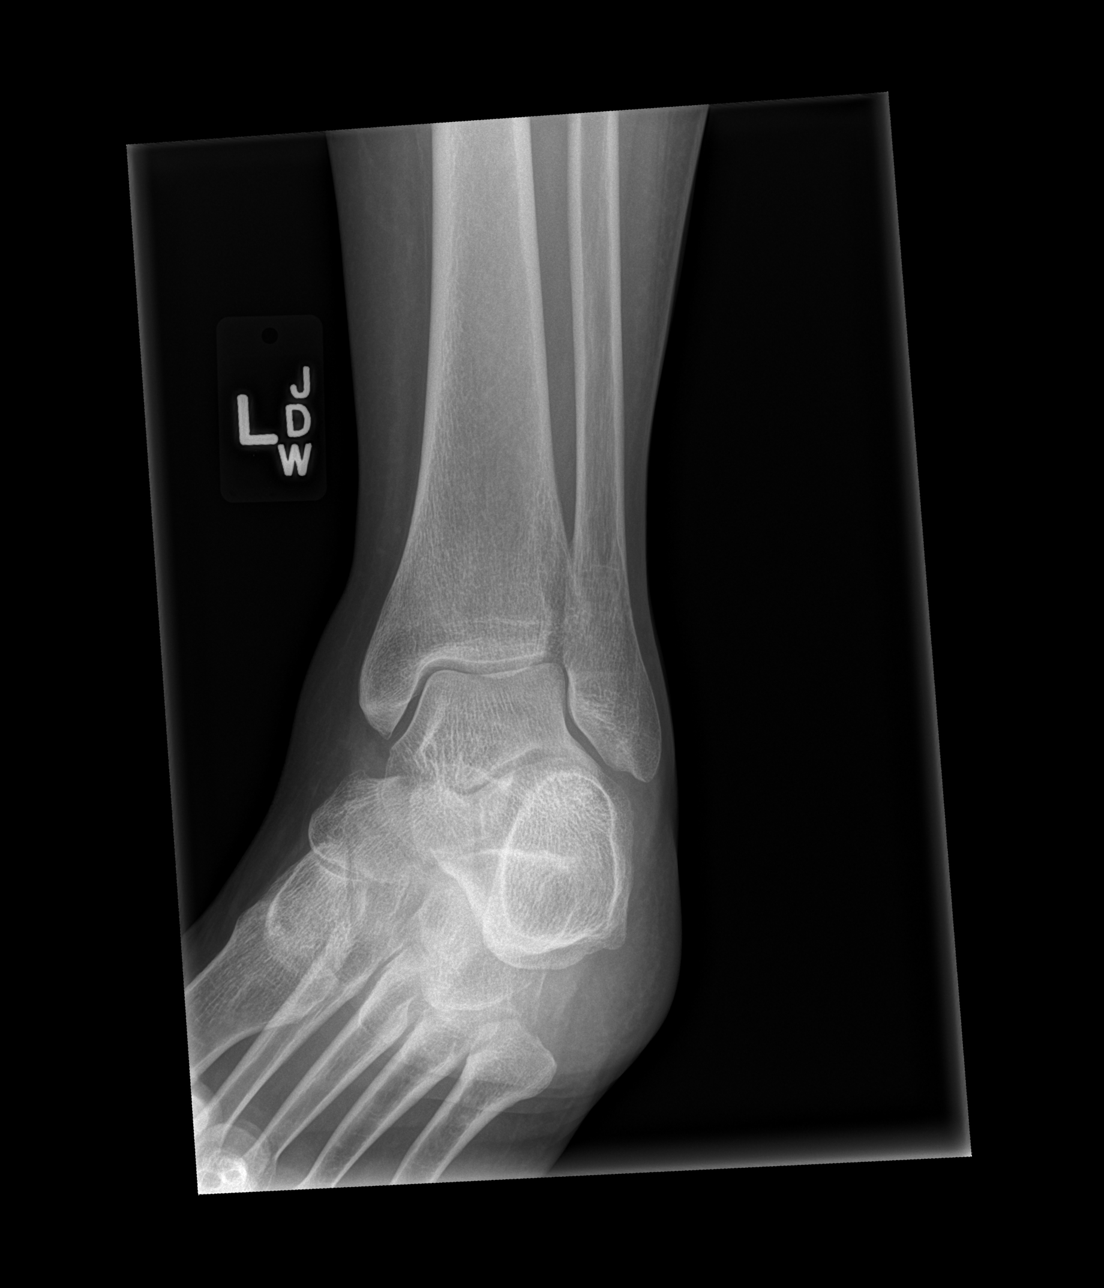

[x ankle lat left]
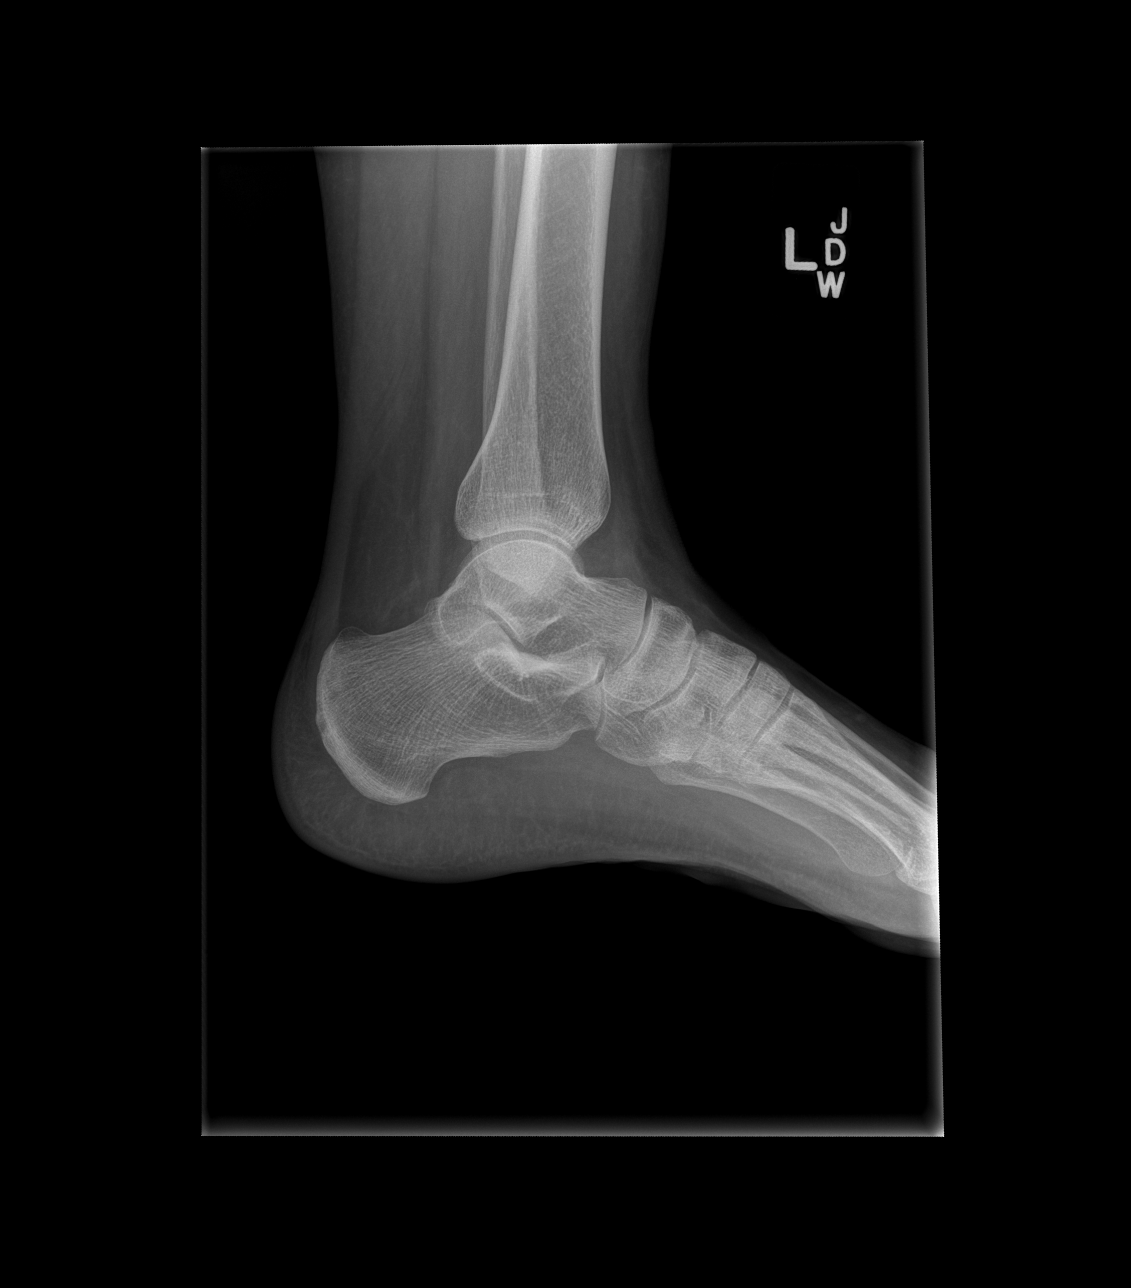

[3 of 3 positions shown; findings below may reference images not displayed]

FINDINGS: There is no evidence of fracture, subluxation or dislocation.

The joint spaces are unremarkable.

No focal bony lesions are present.

Question lateral soft tissue swelling.
IMPRESSION: Question lateral soft tissue swelling -no bony abnormalities.

## 2013-10-05 IMAGING — CR DG CHEST 2V
2 series · 2 of 2 positions shown · non-contrast
Comparison: [DATE] and prior chest radiographs

CLINICAL DATA: 53-year-old female with weakness.

EXAM:
CHEST  2 VIEW

[w chest pa]
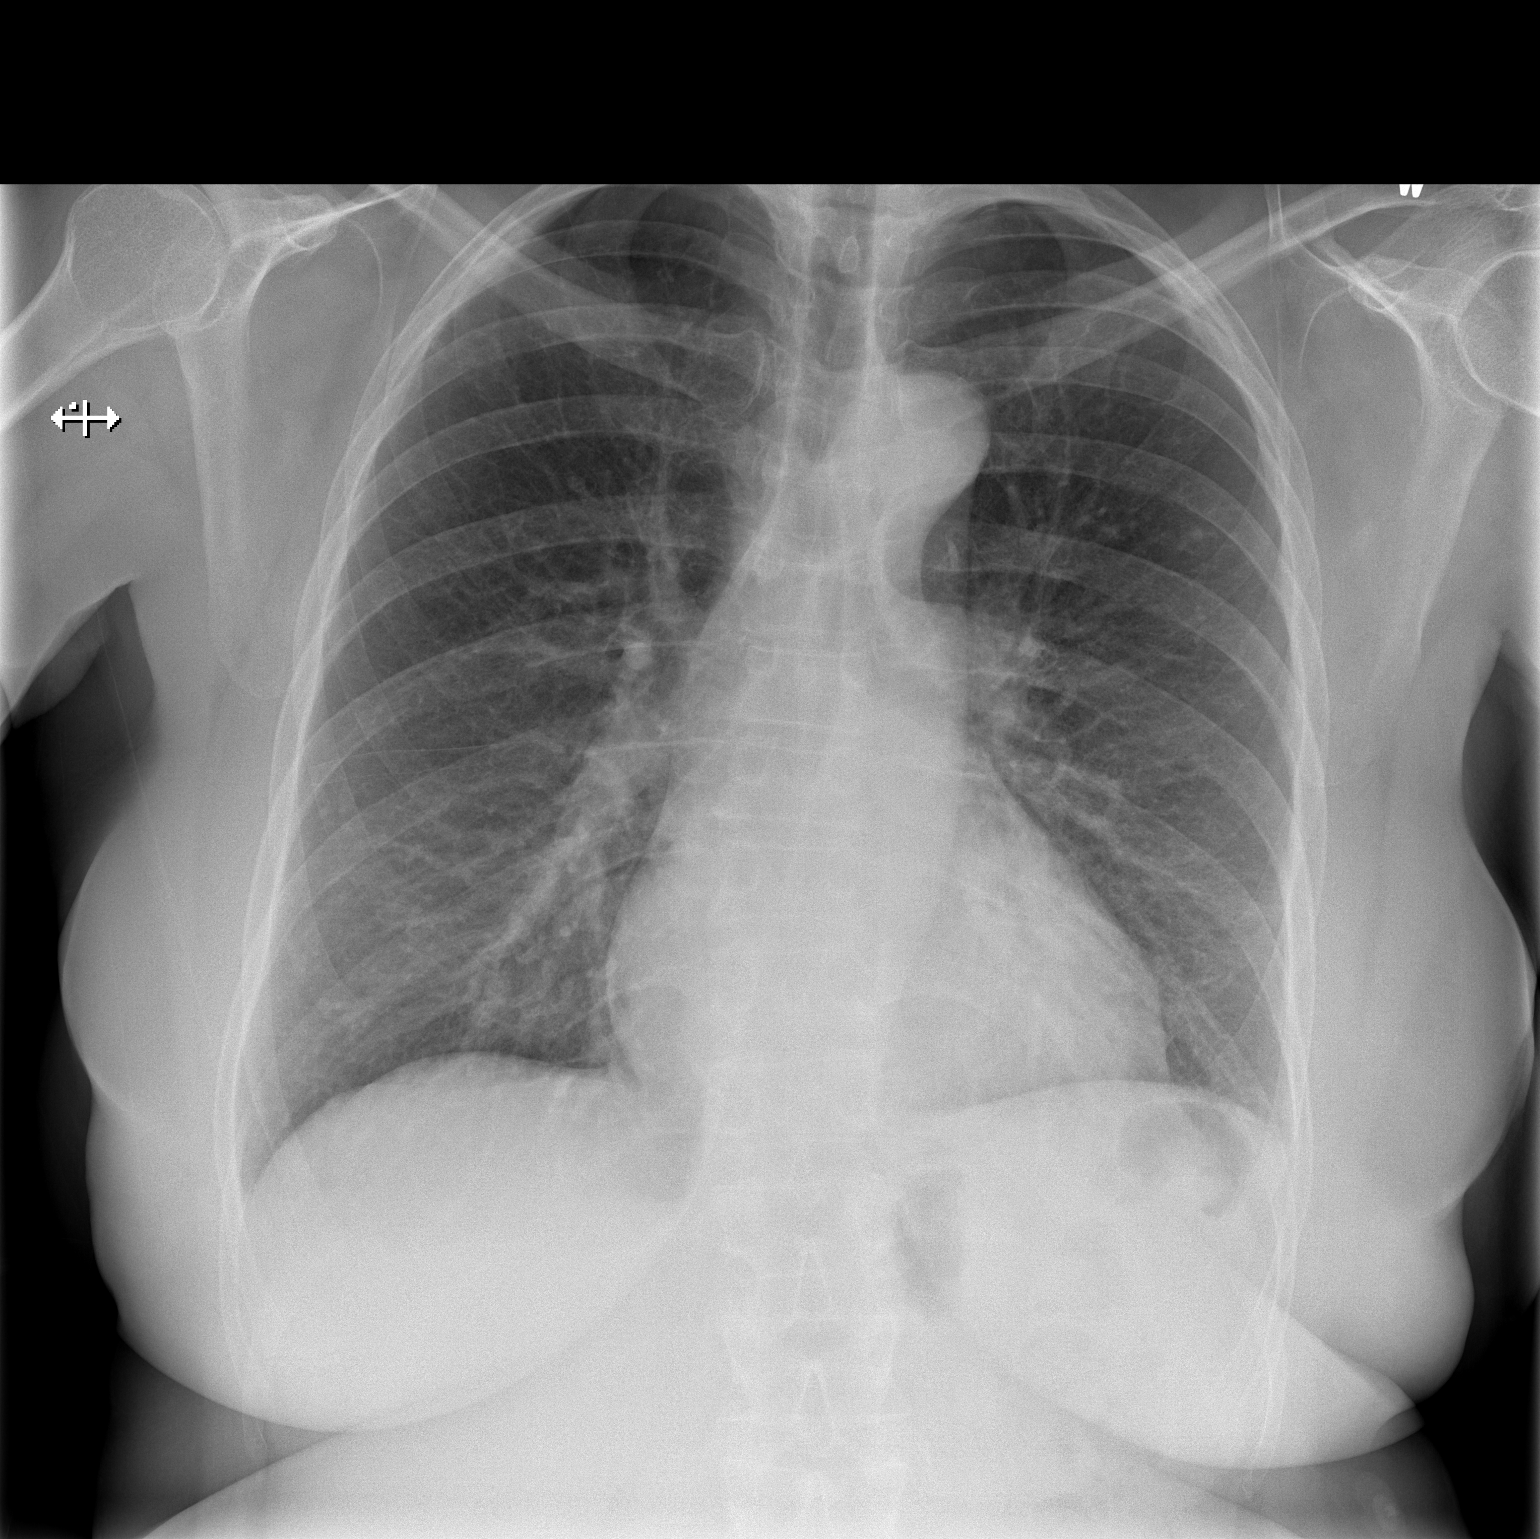

[w chest lat]
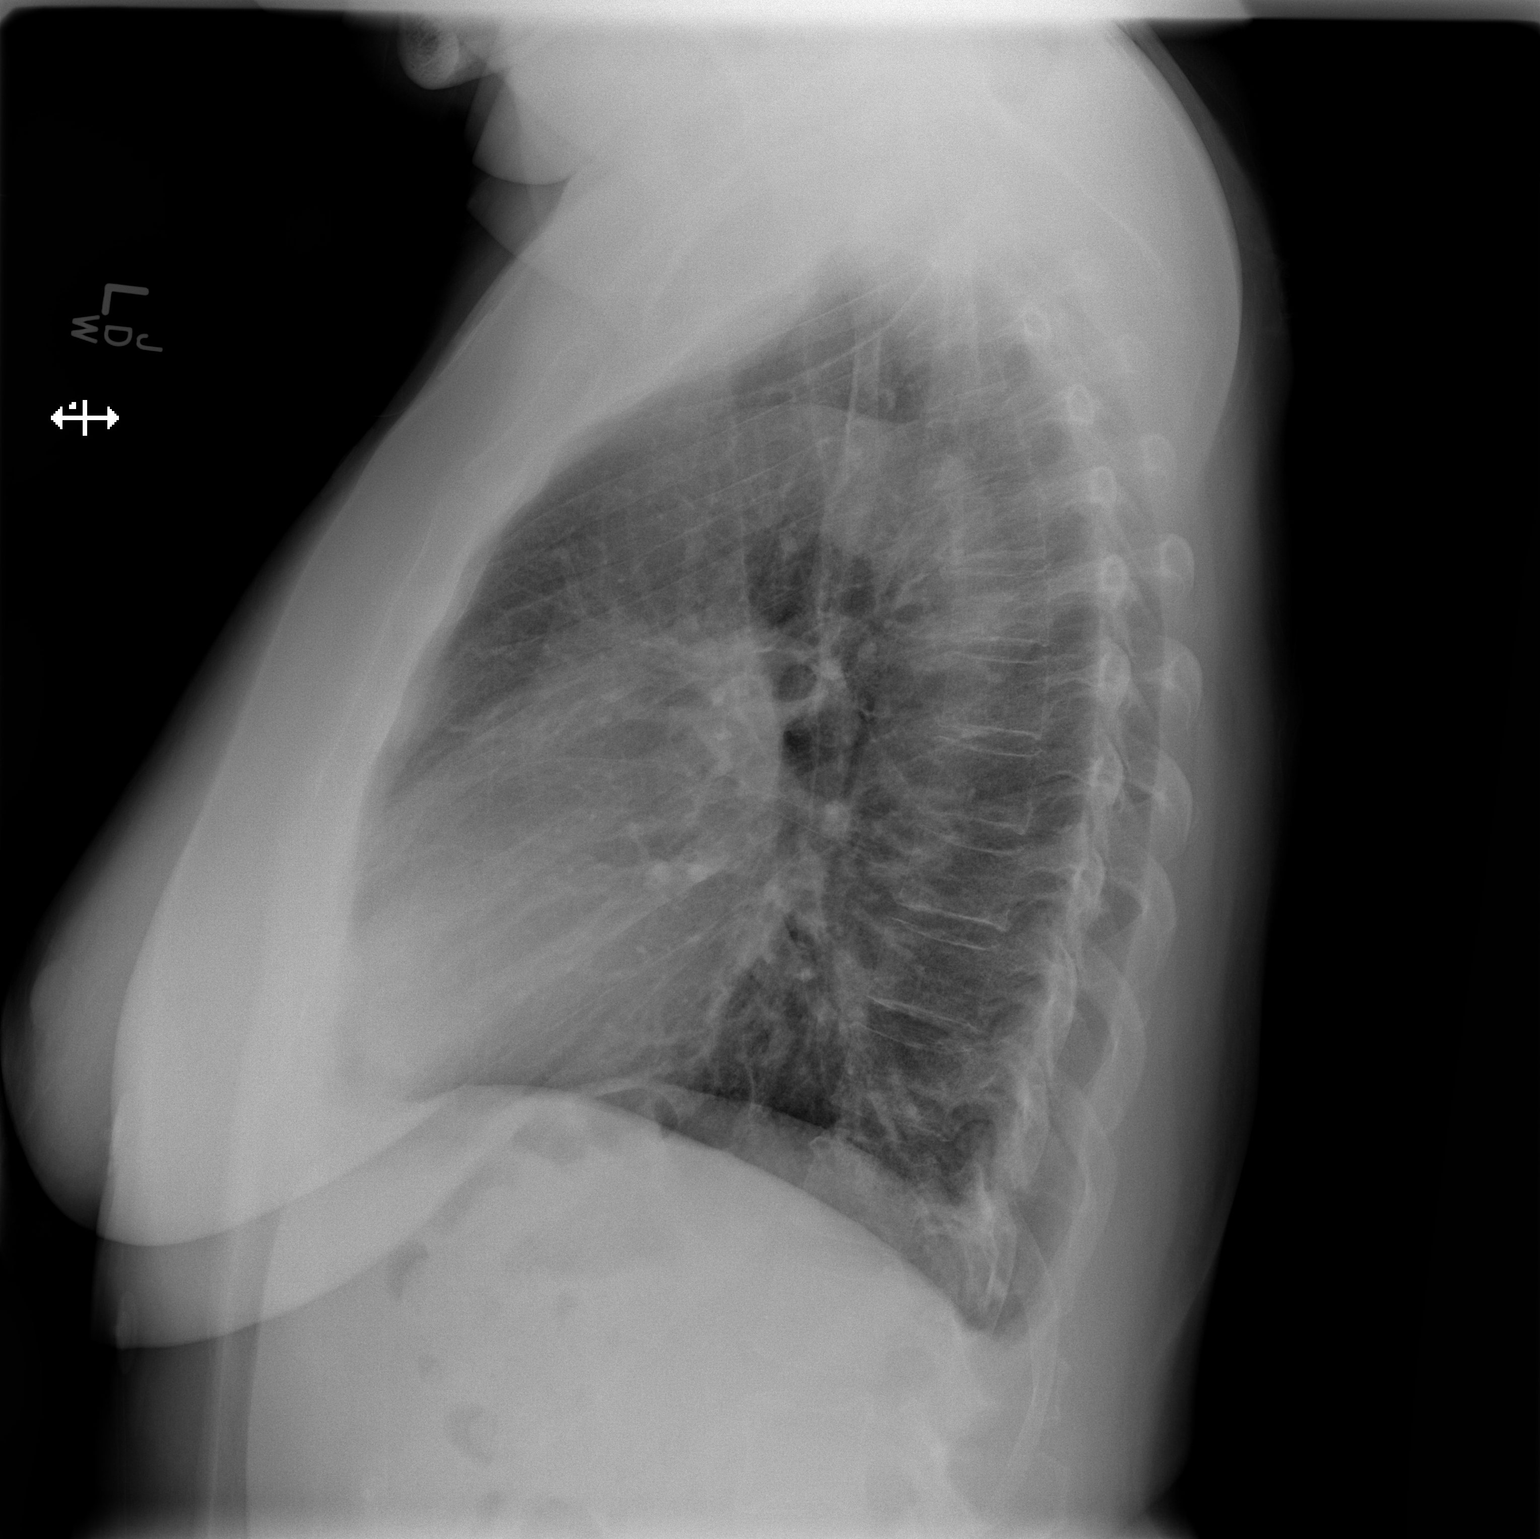

[2 of 2 positions shown; findings below may reference images not displayed]

FINDINGS: Cardiomegaly identified.

Mild peribronchial thickening is stable.

There is no evidence of focal airspace disease, pulmonary edema,
suspicious pulmonary nodule/mass, pleural effusion, or pneumothorax.
No acute bony abnormalities are identified.
IMPRESSION: Cardiomegaly without active cardiopulmonary disease.

## 2013-10-05 MED ORDER — LEVOTHYROXINE SODIUM 50 MCG PO TABS
50.0000 ug | ORAL_TABLET | Freq: Once | ORAL | Status: AC
  Administered 2013-10-05: 50 ug via ORAL
  Filled 2013-10-05: qty 1

## 2013-10-05 MED ORDER — LEVOTHYROXINE SODIUM 50 MCG PO TABS: 50.0000 ug | ORAL_TABLET | Freq: Every day | ORAL | Status: DC

## 2013-10-05 NOTE — Discharge Instructions (Signed)
Hypothyroidism The thyroid is a large gland located in the lower front of your neck. The thyroid gland helps control metabolism. Metabolism is how your body handles food. It controls metabolism with the hormone thyroxine. When this gland is underactive (hypothyroid), it produces too little hormone.  CAUSES These include:   Absence or destruction of thyroid tissue.  Goiter due to iodine deficiency.  Goiter due to medications.  Congenital defects (since birth).  Problems with the pituitary. This causes a lack of TSH (thyroid stimulating hormone). This hormone tells the thyroid to turn out more hormone. SYMPTOMS  Lethargy (feeling as though you have no energy)  Cold intolerance  Weight gain (in spite of normal food intake)  Dry skin  Coarse hair  Menstrual irregularity (if severe, may lead to infertility)  Slowing of thought processes Cardiac problems are also caused by insufficient amounts of thyroid hormone. Hypothyroidism in the newborn is cretinism, and is an extreme form. It is important that this form be treated adequately and immediately or it will lead rapidly to retarded physical and mental development. DIAGNOSIS  To prove hypothyroidism, your caregiver may do blood tests and ultrasound tests. Sometimes the signs are hidden. It may be necessary for your caregiver to watch this illness with blood tests either before or after diagnosis and treatment. TREATMENT  Low levels of thyroid hormone are increased by using synthetic thyroid hormone. This is a safe, effective treatment. It usually takes about four weeks to gain the full effects of the medication. After you have the full effect of the medication, it will generally take another four weeks for problems to leave. Your caregiver may start you on low doses. If you have had heart problems the dose may be gradually increased. It is generally not an emergency to get rapidly to normal. HOME CARE INSTRUCTIONS   Take your  medications as your caregiver suggests. Let your caregiver know of any medications you are taking or start taking. Your caregiver will help you with dosage schedules.  As your condition improves, your dosage needs may increase. It will be necessary to have continuing blood tests as suggested by your caregiver.  Report all suspected medication side effects to your caregiver. SEEK MEDICAL CARE IF: Seek medical care if you develop:  Sweating.  Tremulousness (tremors).  Anxiety.  Rapid weight loss.  Heat intolerance.  Emotional swings.  Diarrhea.  Weakness. SEEK IMMEDIATE MEDICAL CARE IF:  You develop chest pain, an irregular heart beat (palpitations), or a rapid heart beat. MAKE SURE YOU:   Understand these instructions.  Will watch your condition.  Will get help right away if you are not doing well or get worse. Document Released: 04/04/2005 Document Revised: 06/27/2011 Document Reviewed: 11/23/2007 Exit[employer] Patient Information 2015 [employer]. This information is not intended to replace advice given to you by your health care provider. Make sure you discuss any questions you have with your health care provider.  

## 2013-10-05 NOTE — ED Notes (Addendum)
Pt. Began feeling  Tired and weak since Tuesday, denies any n/v/d   Denies any chest pain or sob.  Denies any pain .  No neuro deficits noted. Pt. Stopped taking her Synthroid 6 months ago due to financial .  She thinks it may be the reason.

## 2013-10-05 NOTE — ED Provider Notes (Signed)
CSN: [id number]     Arrival date & time 10/05/13  1715 History   First MD Initiated Contact with Patient 10/05/13 1755     Chief Complaint  Patient presents with  . Fatigue     (Consider location/radiation/quality/duration/timing/severity/associated sxs/prior Treatment) HPI Comments: 6 patient reports generalized fatigue and weakness for the past 4 days. He denies any fever, chills, nausea, vomiting, chest pain or abdominal pain. She thought this may be due to taking Benadryl at home which is not taken for more than a week. She endorses feeling generally weak and unable to complete tasks at home. No focal weakness, numbness or tingling. She does admit to stopping her Synthroid medication about 6 months ago. No other medications. She is not have a Dr. She is scheduled to have an new doctor July 1. She denies any dizziness or lightheadedness. Denies any blood in stool.   The history is provided by the patient.    Past Medical History  Diagnosis Date  . Eczema   . Thyroid disease    Past Surgical History  Procedure Laterality Date  . Abdominal hysterectomy     No family history on file. History  Substance Use Topics  . Smoking status: Current Every Day Smoker  . Smokeless tobacco: Not on file  . Alcohol Use: No   OB History   Grav Para Term Preterm Abortions TAB SAB Ect Mult Living                 Review of Systems  Constitutional: Positive for activity change, appetite change and fatigue. Negative for fever.  HENT: Negative for congestion and rhinorrhea.   Eyes: Negative for visual disturbance.  Respiratory: Negative for cough, chest tightness and shortness of breath.   Cardiovascular: Negative for chest pain.  Gastrointestinal: Negative for nausea, vomiting and abdominal pain.  Genitourinary: Negative for dysuria and hematuria.  Musculoskeletal: Negative for back pain and myalgias.  Skin: Negative for rash.  Neurological: Positive for weakness. Negative for dizziness,  light-headedness and headaches.  A complete 10 system review of systems was obtained and all systems are negative except as noted in the HPI and PMH.      Allergies  Dilaudid and Tramadol  Home Medications   Prior to Admission medications   Medication Sig Start Date End Date Taking? Authorizing Provider  aspirin 325 MG tablet Take 325 mg by mouth daily as needed for moderate pain.   Yes Historical Provider, MD  diphenhydramine-acetaminophen (TYLENOL PM) 25-500 MG TABS Take 1 tablet by mouth at bedtime as needed. Pain/sleep   Yes Historical Provider, MD  ibuprofen (ADVIL,MOTRIN) 200 MG tablet Take 800 mg by mouth every 6 (six) hours as needed. pain   Yes Historical Provider, MD  levothyroxine (SYNTHROID, LEVOTHROID) 50 MCG tablet Take 1 tablet (50 mcg total) by mouth daily before breakfast. 10/05/13   Glynn Octave, MD   BP 112/62  Pulse 59  Temp(Src) 97.7 F (36.5 C) (Oral)  Resp 16  Ht 5\' 8"  (1.727 m)  Wt 215 lb (97.523 kg)  BMI 32.70 kg/m2  SpO2 97% Physical Exam  Nursing note and vitals reviewed. Constitutional: She is oriented to person, place, and time. She appears well-developed and well-nourished. No distress.  HENT:  Head: Normocephalic and atraumatic.  Mouth/Throat: Oropharynx is clear and moist. No oropharyngeal exudate.  Eyes: Conjunctivae and EOM are normal. Pupils are equal, round, and reactive to light.  Neck: Normal range of motion. Neck supple.  No meningismus.  Cardiovascular: Normal rate, regular  rhythm, normal heart sounds and intact distal pulses.   No murmur heard. Pulmonary/Chest: Effort normal and breath sounds normal. No respiratory distress.  Abdominal: Soft. There is no tenderness. There is no rebound and no guarding.  Musculoskeletal: Normal range of motion. She exhibits no edema and no tenderness.  Neurological: She is alert and oriented to person, place, and time. No cranial nerve deficit. She exhibits normal muscle tone. Coordination normal.   No ataxia on finger to nose bilaterally. No pronator drift. 5/5 strength throughout. CN 2-12 intact. Negative Romberg. Equal grip strength. Sensation intact. Gait is normal.   Skin: Skin is warm.  Psychiatric: She has a normal mood and affect. Her behavior is normal.    ED Course  Procedures (including critical care time) Labs Review Labs Reviewed  CBC WITH DIFFERENTIAL - Abnormal; Notable for the following:    Neutrophils Relative % 38 (*)    Lymphocytes Relative 55 (*)    All other components within normal limits  COMPREHENSIVE METABOLIC PANEL - Abnormal; Notable for the following:    Potassium 3.6 (*)    GFR calc non Af Amer 71 (*)    GFR calc Af Amer 82 (*)    All other components within normal limits  TSH - Abnormal; Notable for the following:    TSH 7.390 (*)    All other components within normal limits  TROPONIN I  URINALYSIS, ROUTINE W REFLEX MICROSCOPIC  T4, FREE    Imaging Review Dg Chest 2 View  10/05/2013   CLINICAL DATA:  53 year old female with weakness.  EXAM: CHEST  2 VIEW  COMPARISON:  07/18/2012 and prior chest radiographs  FINDINGS: Cardiomegaly identified.  Mild peribronchial thickening is stable.  There is no evidence of focal airspace disease, pulmonary edema, suspicious pulmonary nodule/mass, pleural effusion, or pneumothorax. No acute bony abnormalities are identified.  IMPRESSION: Cardiomegaly without active cardiopulmonary disease.   Electronically Signed   By: Laveda AbbeJeff  Hu M.D.   On: 10/05/2013 19:32   Dg Ankle Complete Left  10/05/2013   CLINICAL DATA:  Left ankle pain.  EXAM: LEFT ANKLE COMPLETE - 3+ VIEW  COMPARISON:  None.  FINDINGS: There is no evidence of fracture, subluxation or dislocation.  The joint spaces are unremarkable.  No focal bony lesions are present.  Question lateral soft tissue swelling.  IMPRESSION: Question lateral soft tissue swelling -no bony abnormalities.   Electronically Signed   By: Laveda AbbeJeff  Hu M.D.   On: 10/05/2013 19:33     EKG  Interpretation None      MDM   Final diagnoses:  Hypothyroidism, unspecified hypothyroidism type  Other fatigue   Four day history of fatigue without associated symptoms. No focal deficits on exam. Noncompliant with medication for hypothyroid  Suspect patient's fatigue due to hypothyroidism and noncompliance. Her exam is nonfocal. No chest pain  Hemoglobin is stable. Labs remarkable for TSH of 7.39 We'll restart Synthroid.  UA is negative. Stressed importance of compliance with medications with patient. Followup with newly established PCP. She'll be given Synthroid prescription. Return precautions discussed.   BP 112/62  Pulse 59  Temp(Src) 97.7 F (36.5 C) (Oral)  Resp 16  Ht 5\' 8"  (1.727 m)  Wt 215 lb (97.523 kg)  BMI 32.70 kg/m2  SpO2 97%   Date: 10/05/2013  Rate: 78  Rhythm: normal sinus rhythm  QRS Axis: normal  Intervals: normal  ST/T Wave abnormalities: nonspecific ST/T changes  Conduction Disutrbances:none  Narrative Interpretation:   Old EKG Reviewed: none available  Glynn OctaveStephen Chablis Losh, MD 10/05/13 [phone]

## 2013-10-05 NOTE — ED Notes (Signed)
Patient currently in xray ?

## 2013-10-06 LAB — T4, FREE: FREE T4: 0.89 ng/dL (ref 0.80–1.80)

## 2013-11-15 ENCOUNTER — Emergency Department (HOSPITAL_COMMUNITY)
Admission: EM | Admit: 2013-11-15 | Discharge: 2013-11-15 | Disposition: A | Discharge status: Home / Self Care | Payer: PRIVATE HEALTH INSURANCE | Attending: Emergency Medicine | Admitting: Emergency Medicine

## 2013-11-15 ENCOUNTER — Encounter (HOSPITAL_COMMUNITY): Payer: Self-pay | Admitting: Emergency Medicine

## 2013-11-15 DIAGNOSIS — M545 Low back pain, unspecified: Secondary | ICD-10-CM | POA: Diagnosis not present

## 2013-11-15 DIAGNOSIS — M549 Dorsalgia, unspecified: Secondary | ICD-10-CM | POA: Insufficient documentation

## 2013-11-15 DIAGNOSIS — Z7982 Long term (current) use of aspirin: Secondary | ICD-10-CM | POA: Insufficient documentation

## 2013-11-15 DIAGNOSIS — Z79899 Other long term (current) drug therapy: Secondary | ICD-10-CM | POA: Insufficient documentation

## 2013-11-15 DIAGNOSIS — R209 Unspecified disturbances of skin sensation: Secondary | ICD-10-CM | POA: Insufficient documentation

## 2013-11-15 DIAGNOSIS — F17200 Nicotine dependence, unspecified, uncomplicated: Secondary | ICD-10-CM | POA: Insufficient documentation

## 2013-11-15 DIAGNOSIS — Z791 Long term (current) use of non-steroidal anti-inflammatories (NSAID): Secondary | ICD-10-CM | POA: Diagnosis not present

## 2013-11-15 DIAGNOSIS — E039 Hypothyroidism, unspecified: Secondary | ICD-10-CM | POA: Insufficient documentation

## 2013-11-15 DIAGNOSIS — Z872 Personal history of diseases of the skin and subcutaneous tissue: Secondary | ICD-10-CM | POA: Insufficient documentation

## 2013-11-15 MED ORDER — OXYCODONE-ACETAMINOPHEN 5-325 MG PO TABS: 1.0000 | ORAL_TABLET | Freq: Four times a day (QID) | ORAL | Status: DC | PRN

## 2013-11-15 MED ORDER — NAPROXEN 500 MG PO TABS: 500.0000 mg | ORAL_TABLET | Freq: Two times a day (BID) | ORAL | Status: DC | PRN

## 2013-11-15 MED ORDER — METHOCARBAMOL 500 MG PO TABS: 500.0000 mg | ORAL_TABLET | Freq: Three times a day (TID) | ORAL | Status: DC | PRN

## 2013-11-15 NOTE — ED Notes (Signed)
Patient c/o lower back pain x 1 week. Patient denies any heavy lifting, dysuria, or injury. Patient states she has burning sensation in bilateral legs.

## 2013-11-15 NOTE — Discharge Instructions (Signed)
Back Pain:  Your back pain should be treated with medicines such as ibuprofen or aleve and this back pain should get better over the next 2 weeks.  However if you develop severe or worsening pain, low back pain with fever, numbness, weakness or inability to walk or urinate, you should return to the ER immediately.  Please follow up with your doctor this week for a recheck if still having symptoms.  Low back pain is discomfort in the lower back that may be due to injuries to muscles and ligaments around the spine.  Occasionally, it may be caused by a a problem to a part of the spine called a disc.  The pain may last several days or a week;  However, most patients get completely well in 4 weeks.  Self - care:  The application of heat can help soothe the pain.  Maintaining your daily activities, including walking, is encourged, as it will help you get better faster than just staying in bed. Perform gentle stretching as discussed. Drink plenty of fluids.  Medications are also useful to help with pain control.  A commonly prescribed medications includes Percocet, but do not operate heavy machinery or drive while taking this medication.  Non steroidal anti inflammatory medications including Ibuprofen and naproxen;  These medications help both pain and swelling and are very useful in treating back pain.  They should be taken with food, as they can cause stomach upset, and more seriously, stomach bleeding.    Muscle relaxants (Robaxin):  These medications can help with muscle tightness that is a cause of lower back pain.  Most of these medications can cause drowsiness, and it is not safe to drive or use dangerous machinery while taking them.  SEEK IMMEDIATE MEDICAL ATTENTION IF: New numbness, tingling, weakness, or problem with the use of your arms or legs.  Severe back pain not relieved with medications.  Difficulty with or loss of control of your bowel or bladder control.  Increasing pain in any areas of  the body (such as chest or abdominal pain).  Shortness of breath, dizziness or fainting.  Nausea (feeling sick to your stomach), vomiting, fever, or sweats.  You will need to follow up with  Your primary healthcare provider in 1-2 weeks for reassessment.   Back Pain, Adult Back pain is very common. The pain often gets better over time. The cause of back pain is usually not dangerous. Most people can learn to manage their back pain on their own.  HOME CARE   Stay active. Start with short walks on flat ground if you can. Try to walk farther each day.  Do not sit, drive, or stand in one place for more than 30 minutes. Do not stay in bed.  Do not avoid exercise or work. Activity can help your back heal faster.  Be careful when you bend or lift an object. Bend at your knees, keep the object close to you, and do not twist.  Sleep on a firm mattress. Lie on your side, and bend your knees. If you lie on your back, put a pillow under your knees.  Only take medicines as told by your doctor.  Put ice on the injured area.  Put ice in a plastic bag.  Place a towel between your skin and the bag.  Leave the ice on for 15-20 minutes, 03-04 times a day for the first 2 to 3 days. After that, you can switch between ice and heat packs.  Ask your  doctor about back exercises or massage.  Avoid feeling anxious or stressed. Find good ways to deal with stress, such as exercise. GET HELP RIGHT AWAY IF:   Your pain does not go away with rest or medicine.  Your pain does not go away in 1 week.  You have new problems.  You do not feel well.  The pain spreads into your legs.  You cannot control when you poop (bowel movement) or pee (urinate).  Your arms or legs feel weak or lose feeling (numbness).  You feel sick to your stomach (nauseous) or throw up (vomit).  You have belly (abdominal) pain.  You feel like you may pass out (faint). MAKE SURE YOU:   Understand these instructions.  Will  watch your condition.  Will get help right away if you are not doing well or get worse. Document Released: 09/21/2007 Document Revised: 06/27/2011 Document Reviewed: 08/06/2013 [facility] Patient Information 2015 [place], [place]. This information is not intended to replace advice given to you by your health care provider. Make sure you discuss any questions you have with your health care provider.  Back Injury Prevention The following tips can help you to prevent a back injury. PHYSICAL FITNESS  Exercise often. Try to develop strong stomach (abdominal) muscles.  Do aerobic exercises often. This includes walking, jogging, biking, swimming.  Do exercises that help with balance and strength often. This includes tai chi and yoga.  Stretch before and after you exercise.  Keep a healthy weight. DIET   Ask your doctor how much calcium and vitamin D you need every day.  Include calcium in your diet. Foods high in calcium include dairy products; green, leafy vegetables; and products with calcium added (fortified).  Include vitamin D in your diet. Foods high in vitamin D include milk and products with vitamin D added.  Think about taking a multivitamin or other nutritional products called " supplements."  Stop smoking if you smoke. POSTURE   Sit and stand up straight. Avoid leaning forward or hunching over.  Choose chairs that support your lower back.  If you work at a desk:  Sit close to your work so you do not lean over.  Keep your chin tucked in.  Keep your neck drawn back.  Keep your elbows bent at a right angle. Your arms should look like the letter "L."  Sit high and close to the steering wheel when you drive. Add low back support to your car seat if needed.  Avoid sitting or standing in one position for too long. Get up and move around every hour. Take breaks if you are driving for a long time.  Sleep on your side with your knees slightly bent. You can also sleep on your  back with a pillow under your knees. Do not sleep on your stomach. LIFTING, TWISTING, AND REACHING  Avoid heavy lifting, especially lifting over and over again. If you must do heavy lifting:  Stretch before lifting.  Work slowly.  Rest between lifts.  Use carts and dollies to move objects when possible.  Make several small trips instead of carrying 1 heavy load.  Ask for help when you need it.  Ask for help when moving big, awkward objects.  Follow these steps when lifting:  Stand with your feet shoulder-width apart.  Get as close to the object as you can. Do not pick up heavy objects that are far from your body.  Use handles or lifting straps when possible.  Bend at your knees.  Squat down, but keep your heels off the floor.  Keep your shoulders back, your chin tucked in, and your back straight.  Lift the object slowly. Tighten the muscles in your legs, stomach, and butt. Keep the object as close to the center of your body as possible.  Reverse these directions when you put a load down.  Do not:  Lift the object above your waist.  Twist at the waist while lifting or carrying a load. Move your feet if you need to turn, not your waist.  Bend over without bending at your knees.  Avoid reaching over your head, across a table, or for an object on a high surface. OTHER TIPS  Avoid wet floors and keep sidewalks clear of ice.  Do not sleep on a mattress that is too soft or too hard.  Keep items that you use often within easy reach.  Put heavier objects on shelves at waist level. Put lighter objects on lower or higher shelves.  Find ways to lessen your stress. You can try exercise, massage, or relaxation.  Get help for depression or anxiety if needed. GET HELP IF:  You injure your back.  You have questions about diet, exercise, or other ways to prevent back injuries. MAKE SURE YOU:  Understand these instructions.  Will watch your condition.  Will get help  right away if you are not doing well or get worse. Document Released: 09/21/2007 Document Revised: 06/27/2011 Document Reviewed: 05/16/2011 [facility] Patient Information 2015 [place], [place]. This information is not intended to replace advice given to you by your health care provider. Make sure you discuss any questions you have with your health care provider.  Heat Therapy Heat therapy can help make painful, stiff muscles and joints feel better. Do not use heat on new injuries. Wait at least 48 hours after an injury to use heat. Do not use heat when you have aches or pains right after an activity. If you still have pain 3 hours after stopping the activity, then you may use heat. HOME CARE Wet heat pack  Soak a clean towel in warm water. Squeeze out the extra water.  Put the warm, wet towel in a plastic bag.  Place a thin, dry towel between your skin and the bag.  Put the heat pack on the area for 5 minutes, and check your skin. Your skin may be pink, but it should not be red.  Leave the heat pack on the area for 15 to 30 minutes.  Repeat this every 2 to 4 hours while awake. Do not use heat while you are sleeping. Warm water bath  Fill a tub with warm water.  Place the affected body part in the tub.  Soak the area for 20 to 40 minutes.  Repeat as needed. Hot water bottle  Fill the water bottle half full with hot water.  Press out the extra air. Close the cap tightly.  Place a dry towel between your skin and the bottle.  Put the bottle on the area for 5 minutes, and check your skin. Your skin may be pink, but it should not be red.  Leave the bottle on the area for 15 to 30 minutes.  Repeat this every 2 to 4 hours while awake. Electric heating pad  Place a dry towel between your skin and the heating pad.  Set the heating pad on low heat.  Put the heating pad on the area for 10 minutes, and check your skin. Your skin may  be pink, but it should not be red.  Leave the  heating pad on the area for 20 to 40 minutes.  Repeat this every 2 to 4 hours while awake.  Do not lie on the heating pad.  Do not fall asleep while using the heating pad.  Do not use the heating pad near water. GET HELP RIGHT AWAY IF:  You get blisters or red skin.  Your skin is puffy (swollen), or you lose feeling (numbness) in the affected area.  You have any new problems.  Your problems are getting worse.  You have any questions or concerns. If you have any problems, stop using heat therapy until you see your doctor. MAKE SURE YOU:  Understand these instructions.  Will watch your condition.  Will get help right away if you are not doing well or get worse. Document Released: 06/27/2011 Document Reviewed: 05/28/2013 [facility] [place] Patient Information 2015 [place]. This information is not intended to replace advice given to you by your health care provider. Make sure you discuss any questions you have with your health care provider.

## 2013-11-15 NOTE — ED Provider Notes (Signed)
CSN: [id number]     Arrival date & time 11/15/13  1234 History   First MD Initiated Contact with Patient 11/15/13 1412   This chart was scribed for non-physician practitioner Allen Derry, PA-C,  working with Audree Camel, MD by Gwenevere Abbot, ED scribe. This patient was seen in room WTR9/WTR9 and the patient's care was started at 2:13 PM.    Chief Complaint  Patient presents with  . Back Pain   Patient is a 53 y.o. female presenting with back pain. The history is provided by the patient. No language interpreter was used.  Back Pain Location:  Lumbar spine Quality:  Aching Radiates to:  L posterior upper leg, R posterior upper leg, L thigh, R thigh, R knee, L knee, L foot and R foot Pain severity:  Moderate Pain is:  Same all the time Onset quality:  Gradual Duration:  1 week Timing:  Intermittent Progression:  Waxing and waning Chronicity:  New Context: lifting heavy objects (does not recall specific event, but states her job demands her to do heavy lifting)   Context: not falling, not occupational injury, not recent injury and not twisting   Relieved by:  Ibuprofen, NSAIDs and heating pad (with mild improvement) Worsened by:  Standing and movement Ineffective treatments:  Ibuprofen, heating pad and NSAIDs Associated symptoms: leg pain and tingling (b/l feet, which is positional and intermittent)   Associated symptoms: no abdominal pain, no bladder incontinence, no bowel incontinence, no chest pain, no dysuria, no fever, no headaches, no numbness, no paresthesias, no pelvic pain, no perianal numbness, no weakness and no weight loss   Risk factors: menopause    HPI Comments:  Jessica Cooke is a 53 y.o. female with a PMHx of hypothyroidism and PSHx of hysterectomy, who presents to the Emergency Department complaining of intermittent, aching, 6/10 lower back pain that radiates across the lower back and down to the legs and feet. Pt reports that the pain reached a 9/10 on  last night, with associated symptoms of tingling in the feet when laying down but otherwise these symptoms resolve with change in position. Pt states that sitting for long periods of time, and most movement exacerbates pain, however pt states that she is ambulatory without difficulty. Pt reports that she has tried tylenol, motrin 800 mg, and heat compress, with mild relief. 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 Pt denies dysuria, hematuria, or malodorous urine, vaginal bleeding or discharge. Pt denies fevers, chills, wt loss, CP, SOB, abd pain nausea, vomiting, or diarrhea. Pt denies numbness or tingling in the groin, or weakness in one leg versus the other. Denies prior back injury that she knows of.     Past Medical History  Diagnosis Date  . Eczema   . Thyroid disease    Past Surgical History  Procedure Laterality Date  . Abdominal hysterectomy     Family History  Problem Relation Age of Onset  . Hypertension Mother   . Hypertension Sister   . Hypertension Brother    History  Substance Use Topics  . Smoking status: Current Every Day Smoker -- 1.00 packs/day    Types: Cigarettes  . Smokeless tobacco: Never Used  . Alcohol Use: No   OB History   Grav Para Term Preterm Abortions TAB SAB Ect Mult Living                 Review of Systems  Constitutional:  Negative for fever, chills and weight loss.  Respiratory: Negative for shortness of breath.   Cardiovascular: Negative for chest pain.  Gastrointestinal: Negative for nausea, vomiting, abdominal pain, diarrhea, constipation and bowel incontinence.  Genitourinary: Negative for bladder incontinence, dysuria, urgency, hematuria, decreased urine volume, vaginal bleeding, vaginal discharge, difficulty urinating and pelvic pain.  Musculoskeletal: Positive for back pain. Negative for arthralgias, joint swelling, myalgias, neck pain and neck stiffness.  Skin: Negative for color  change.  Neurological: Positive for tingling (b/l feet, which is positional and intermittent). Negative for weakness, numbness, headaches and paresthesias.  10 Systems reviewed and are negative for acute change except as noted in the HPI.     Allergies  Dilaudid and Tramadol  Home Medications   Prior to Admission medications   Medication Sig Start Date End Date Taking? Authorizing Provider  aspirin 325 MG tablet Take 325 mg by mouth daily as needed for moderate pain.   Yes Historical Provider, MD  diphenhydramine-acetaminophen (TYLENOL PM) 25-500 MG TABS Take 1 tablet by mouth at bedtime as needed. Pain/sleep   Yes Historical Provider, MD  ibuprofen (ADVIL,MOTRIN) 200 MG tablet Take 800 mg by mouth every 6 (six) hours as needed. pain   Yes Historical Provider, MD  levothyroxine (SYNTHROID, LEVOTHROID) 50 MCG tablet Take 1 tablet (50 mcg total) by mouth daily before breakfast. 10/05/13  Yes Glynn Octave, MD  methocarbamol (ROBAXIN) 500 MG tablet Take 1 tablet (500 mg total) by mouth every 8 (eight) hours as needed for muscle spasms. 11/15/13   Tija Biss Strupp Camprubi-Soms, PA-C  naproxen (NAPROSYN) 500 MG tablet Take 1 tablet (500 mg total) by mouth 2 (two) times daily as needed for mild pain, moderate pain or headache (TAKE WITH MEALS.). 11/15/13   Afsana Liera Strupp Camprubi-Soms, PA-C  oxyCODONE-acetaminophen (PERCOCET) 5-325 MG per tablet Take 1-2 tablets by mouth every 6 (six) hours as needed for severe pain. 11/15/13   Sampson Self Strupp Camprubi-Soms, PA-C   BP 117/81  Pulse 68  Temp(Src) 98.1 F (36.7 C) (Oral)  Resp 20  Ht 5\' 9"  (1.753 m)  Wt 216 lb (97.977 kg)  BMI 31.88 kg/m2  SpO2 95% Physical Exam  Nursing note and vitals reviewed. Constitutional: She is oriented to person, place, and time. Vital signs are normal. She appears well-developed and well-nourished. No distress.  HENT:  Head: Normocephalic and atraumatic.  Mouth/Throat: Mucous membranes are normal.  Eyes:  Conjunctivae and EOM are normal.  Neck: Normal range of motion. Neck supple. No spinous process tenderness and no muscular tenderness present. No rigidity. Normal range of motion present.  FROM intact with no paraspinous muscle or midline TTP or deformity, no bony step offs, no rigidity or meningeal signs  Cardiovascular: Normal rate.   Pulmonary/Chest: Effort normal. No respiratory distress.  Abdominal: Soft. Normal appearance and bowel sounds are normal. She exhibits no distension. There is no tenderness. There is no rigidity, no rebound, no guarding and no CVA tenderness.  Soft, NT, ND, +BS throughout, no r/g/r, no CVA TTP  Musculoskeletal:       Right hip: Normal.       Left hip: Normal.       Right knee: Normal.       Left knee: Normal.       Right ankle: Normal.       Left ankle: Normal.       Cervical back: Normal.       Thoracic back: Normal.       Lumbar back: She exhibits decreased range  of motion (secondary to pain), pain and spasm. She exhibits no bony tenderness and no deformity.       Back:  Lumbar spine with Limited range of motion secondary to pain, no bony tenderness to palpation or midline deformity, with paraspinous muscle tenderness bilaterally and mild spasm noted. No hip pain with squeezing or TTP over greater trochanter. Negative straight leg raise bilaterally. Bilateral knees and ankles with full range of motion intact, no swelling or deformity. Strength 5/5 in all extremities. Sensation grossly intact all extremities. Gait not antalgic.  Neurological: She is alert and oriented to person, place, and time. She has normal strength and normal reflexes. No sensory deficit. Coordination and gait normal.  Sensation grossly intact in all extremities, strength 5/5 in all extremities, gait nonantalgic. No abnormal coordination with gait.  Skin: Skin is warm, dry and intact. No erythema.  Psychiatric: She has a normal mood and affect. Her behavior is normal.    ED Course   Procedures  DIAGNOSTIC STUDIES: Oxygen Saturation is 95% on RA, adequate by my interpretation.  COORDINATION OF CARE: 2:22 PM-Discussed treatment plan which includes pain medication and naproxen and robaxin with pt at bedside and pt agreed to plan.   EKG Interpretation None      MDM   Final diagnoses:  Bilateral low back pain without sciatica    No red flag s/s of low back pain. No s/s of central cord compression or cauda equina. Lower extremities are neurovascularly intact and patient is ambulating without difficulty. No need for imaging at this time. No urinary or vaginal symptoms, will not obtain urine specimen at this time. Pt with hx of hysterectomy, no need for urine preg.  Patient was counseled on back pain precautions and told to do activity as tolerated but do not lift, push, or pull heavy objects more than 10 pounds for the next week. Patient counseled to use ice or heat on back for no longer than 15 minutes every hour.   Rx given for muscle relaxer and counseled on proper use of muscle relaxant medication. Rx given for narcotic pain medicine and counseled on proper use of narcotic pain medications. Told she can increase to every 4 hrs if needed while pain is worse. Counseled not to combine this medication with others containing tylenol. Urged patient not to drink alcohol, drive, or perform any other activities that requires focus while taking either of these medications.   Patient urged to follow-up with PCP if pain does not improve with treatment and rest or if pain becomes recurrent. Urged to return with worsening severe pain, loss of bowel or bladder control, trouble walking.   The patient verbalizes understanding and agrees with the plan. Stable at time of discharge.  I personally performed the services described in this documentation, which was scribed in my presence. The recorded information has been reviewed and is accurate.  BP 125/77  Pulse 73  Temp(Src) 98.1 F  (36.7 C) (Oral)  Resp 16  Ht 5\' 9"  (1.753 m)  Wt 216 lb (97.977 kg)  BMI 31.88 kg/m2  SpO2 97%  Meds ordered this encounter  Medications  . oxyCODONE-acetaminophen (PERCOCET) 5-325 MG per tablet    Sig: Take 1-2 tablets by mouth every 6 (six) hours as needed for severe pain.    Dispense:  6 tablet    Refill:  0    Order Specific Question:  Supervising Provider    Answer:  Eber HongMILLER, BRIAN D [[id number]]  . naproxen (NAPROSYN) 500 MG tablet  Sig: Take 1 tablet (500 mg total) by mouth 2 (two) times daily as needed for mild pain, moderate pain or headache (TAKE WITH MEALS.).    Dispense:  20 tablet    Refill:  0    Order Specific Question:  Supervising Provider    Answer:  Eber Hong D [[id number]]  . methocarbamol (ROBAXIN) 500 MG tablet    Sig: Take 1 tablet (500 mg total) by mouth every 8 (eight) hours as needed for muscle spasms.    Dispense:  15 tablet    Refill:  0    Order Specific Question:  Supervising Provider    Answer:  Vida Roller [address] Camprubi-Soms, PA-C 11/15/13 1635

## 2013-11-18 NOTE — ED Provider Notes (Signed)
Medical screening examination/treatment/procedure(s) were performed by non-physician practitioner and as supervising physician I was immediately available for consultation/collaboration.   EKG Interpretation None        Audree Camel, MD 11/18/13 [id number]

## 2014-03-30 ENCOUNTER — Emergency Department (HOSPITAL_COMMUNITY)
Admission: EM | Admit: 2014-03-30 | Discharge: 2014-03-30 | Disposition: A | Discharge status: Home / Self Care | Payer: BC Managed Care – PPO | Attending: Emergency Medicine | Admitting: Emergency Medicine

## 2014-03-30 ENCOUNTER — Encounter (HOSPITAL_COMMUNITY): Payer: Self-pay | Admitting: Family Medicine

## 2014-03-30 ENCOUNTER — Emergency Department (HOSPITAL_COMMUNITY): Payer: BC Managed Care – PPO

## 2014-03-30 DIAGNOSIS — R42 Dizziness and giddiness: Secondary | ICD-10-CM

## 2014-03-30 DIAGNOSIS — R52 Pain, unspecified: Secondary | ICD-10-CM

## 2014-03-30 DIAGNOSIS — Z720 Tobacco use: Secondary | ICD-10-CM | POA: Insufficient documentation

## 2014-03-30 DIAGNOSIS — E039 Hypothyroidism, unspecified: Secondary | ICD-10-CM | POA: Insufficient documentation

## 2014-03-30 DIAGNOSIS — Z872 Personal history of diseases of the skin and subcutaneous tissue: Secondary | ICD-10-CM | POA: Insufficient documentation

## 2014-03-30 DIAGNOSIS — Z79899 Other long term (current) drug therapy: Secondary | ICD-10-CM | POA: Insufficient documentation

## 2014-03-30 HISTORY — DX: Hypothyroidism, unspecified: E03.9

## 2014-03-30 LAB — COMPREHENSIVE METABOLIC PANEL
ALBUMIN: 4.4 g/dL (ref 3.5–5.2)
ALK PHOS: 55 U/L (ref 39–117)
ALT: 18 U/L (ref 0–35)
ANION GAP: 13 (ref 5–15)
AST: 21 U/L (ref 0–37)
BILIRUBIN TOTAL: 0.4 mg/dL (ref 0.3–1.2)
BUN: 11 mg/dL (ref 6–23)
CHLORIDE: 105 meq/L (ref 96–112)
CO2: 24 mEq/L (ref 19–32)
Calcium: 10 mg/dL (ref 8.4–10.5)
Creatinine, Ser: 0.82 mg/dL (ref 0.50–1.10)
GFR calc Af Amer: >90 mL/min (ref >90)
GFR calc non Af Amer: 79 mL/min — ABNORMAL LOW (ref >90)
Glucose, Bld: 110 mg/dL — ABNORMAL HIGH (ref 70–99)
POTASSIUM: 3.7 meq/L (ref 3.7–5.3)
SODIUM: 142 meq/L (ref 137–147)
TOTAL PROTEIN: 7.2 g/dL (ref 6.0–8.3)

## 2014-03-30 LAB — TROPONIN I: Troponin I: <0.3 ng/mL (ref <0.30)

## 2014-03-30 LAB — CBC
HCT: 43.8 % (ref 36.0–46.0)
Hemoglobin: 14.7 g/dL (ref 12.0–15.0)
MCH: 30.8 pg (ref 26.0–34.0)
MCHC: 33.6 g/dL (ref 30.0–36.0)
MCV: 91.6 fL (ref 78.0–100.0)
PLATELETS: 319 10*3/uL (ref 150–400)
RBC: 4.78 MIL/uL (ref 3.87–5.11)
RDW: 13.8 % (ref 11.5–15.5)
WBC: 6 10*3/uL (ref 4.0–10.5)

## 2014-03-30 IMAGING — CT CT HEAD W/O CM
2 series · 16 of 30 positions shown, 20 images · non-contrast
Comparison: None.

CLINICAL DATA: Dizziness.

EXAM:
CT HEAD WITHOUT CONTRAST
TECHNIQUE: Contiguous axial images were obtained from the base of the skull
through the vertex without intravenous contrast.

[Series 2: head w/o · axial · non-contrast · 0.45mm/px · z∈[+257,+377]mm · 13 of 30 slices shown, 17 images]
[im 3/30  brain]
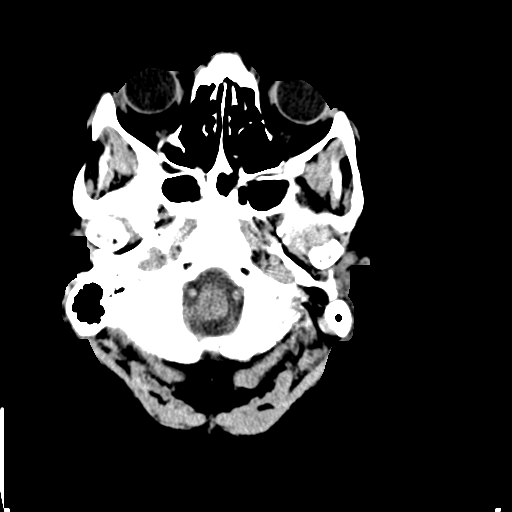
[im 3/30  bone]
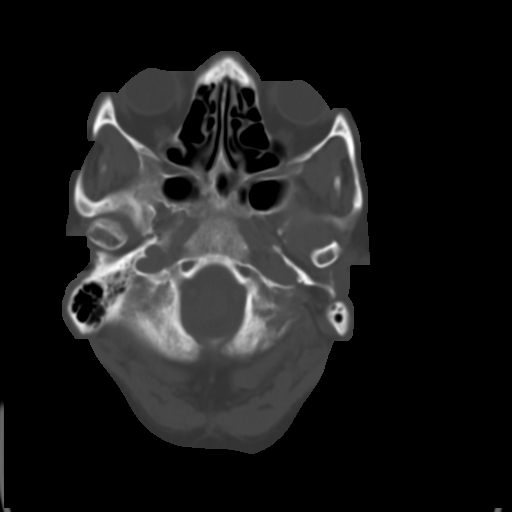
[im 5/30  brain]
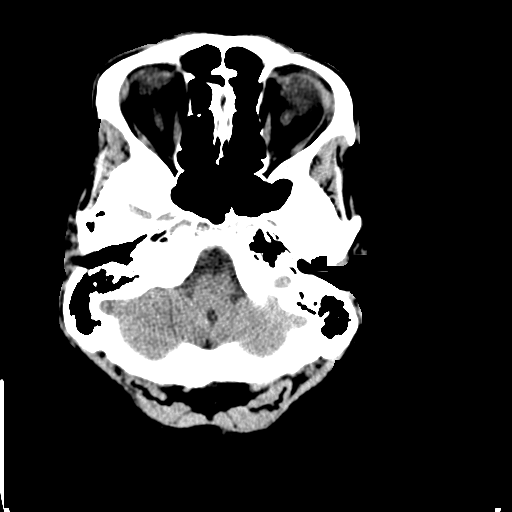
[im 7/30  brain]
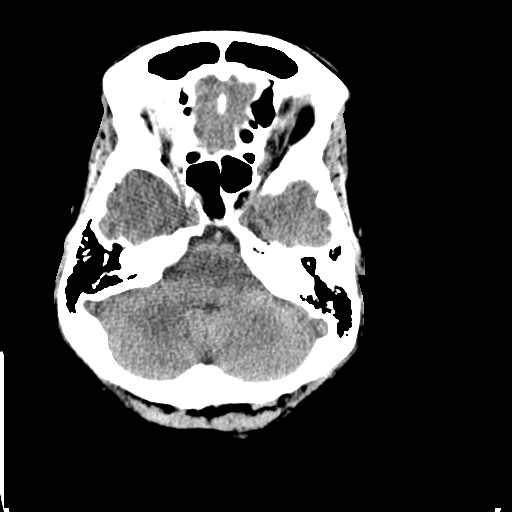
[im 9/30  brain]
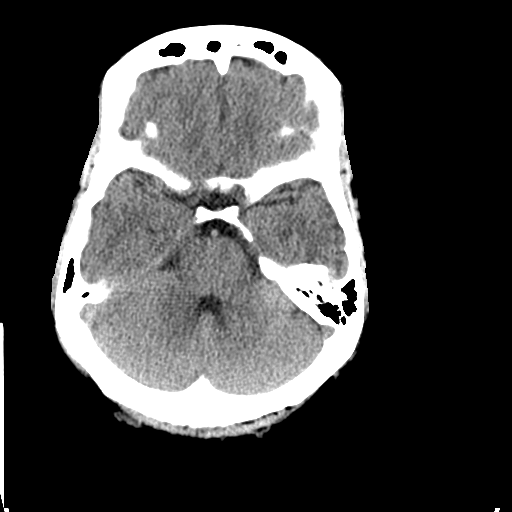
[im 11/30  brain]
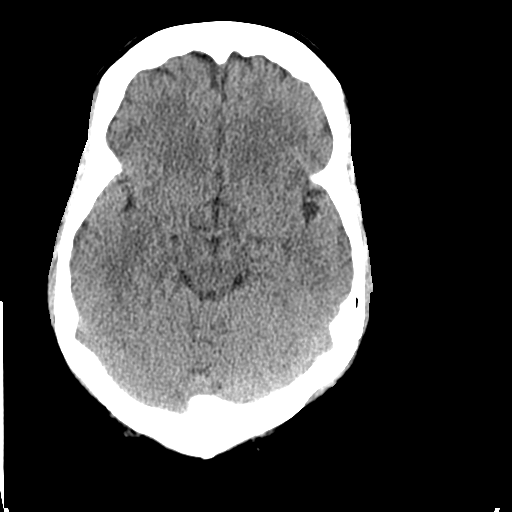
[im 11/30  bone]
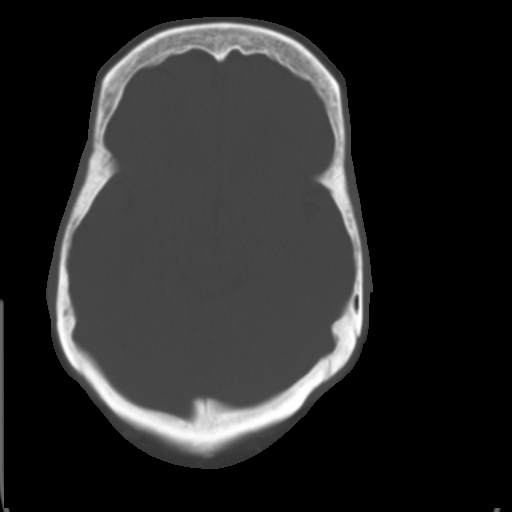
[im 13/30  brain]
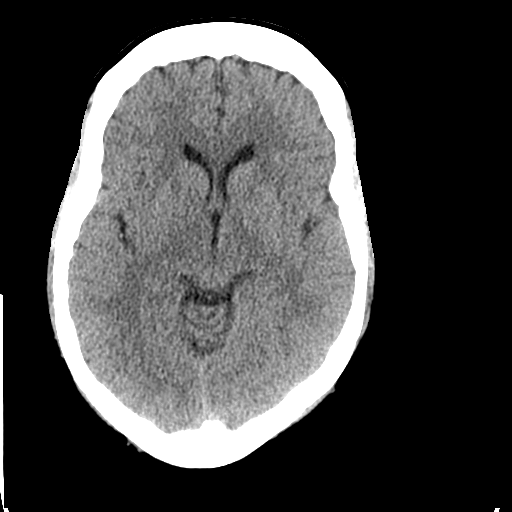
[im 15/30  brain]
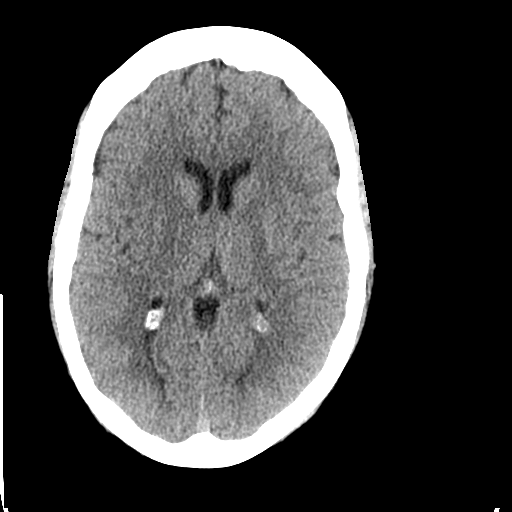
[im 17/30  brain]
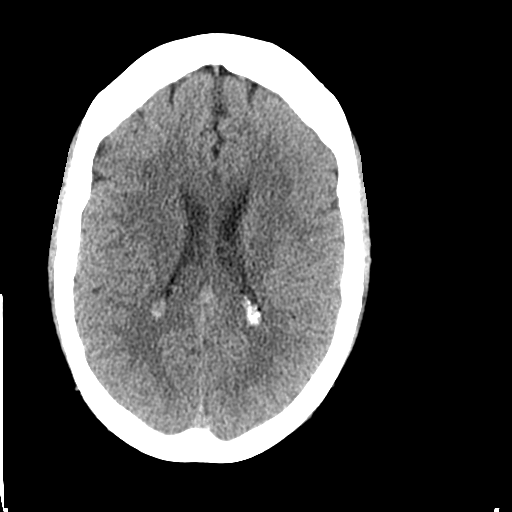
[im 19/30  brain]
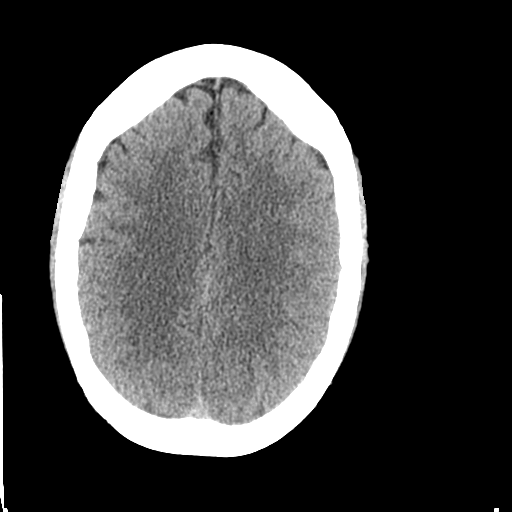
[im 19/30  bone]
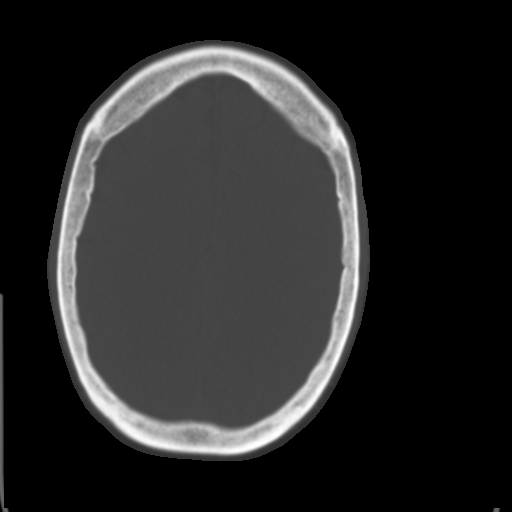
[im 21/30  brain]
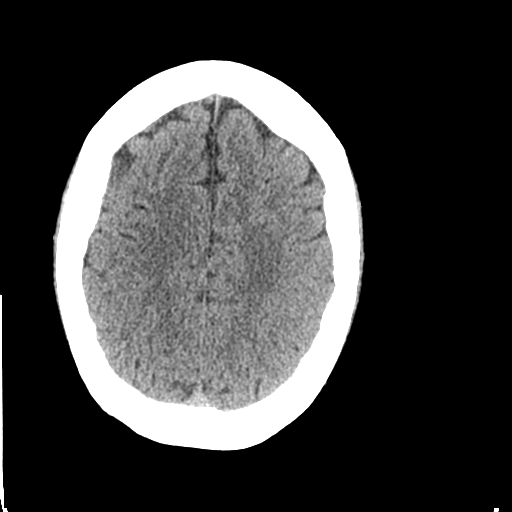
[im 23/30  brain]
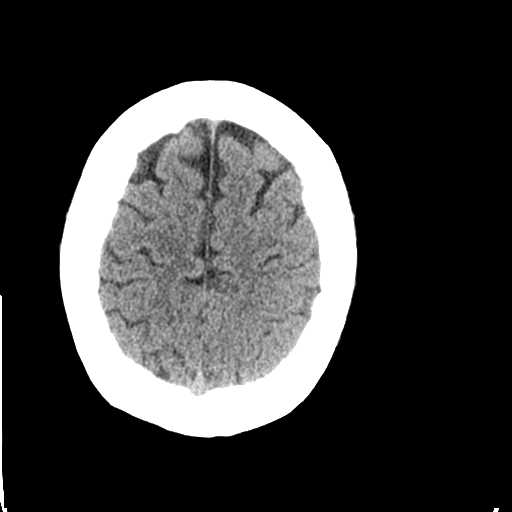
[im 25/30  brain]
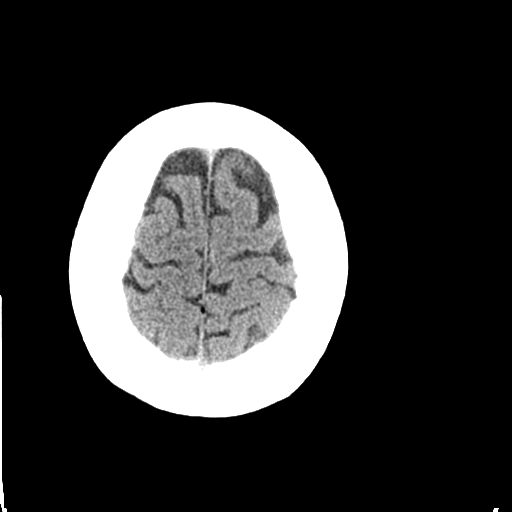
[im 27/30  brain]
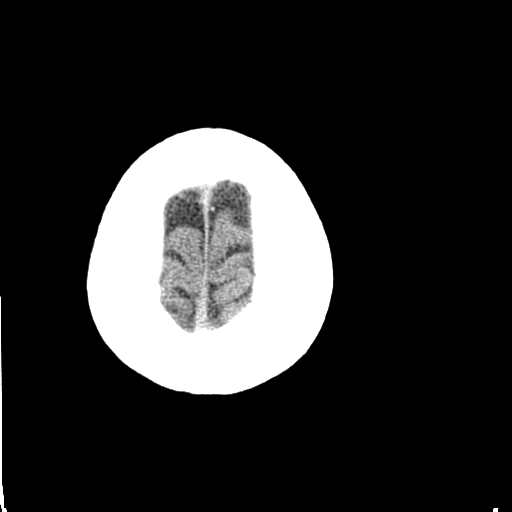
[im 27/30  bone]
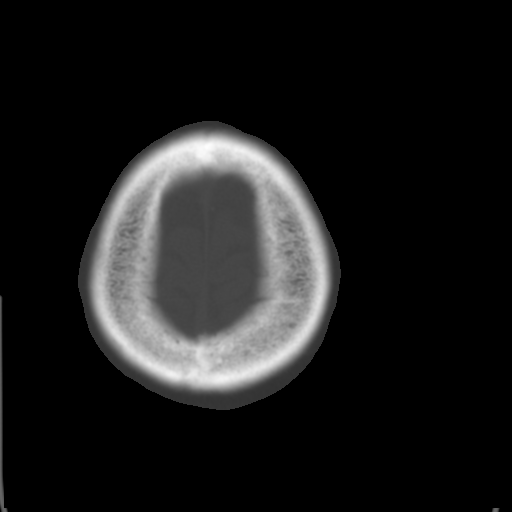

[Series 3: bone windows · axial · 0.45mm/px · z∈[+257,+297]mm · 3 of 30 slices shown]
[im 3/30  bone]
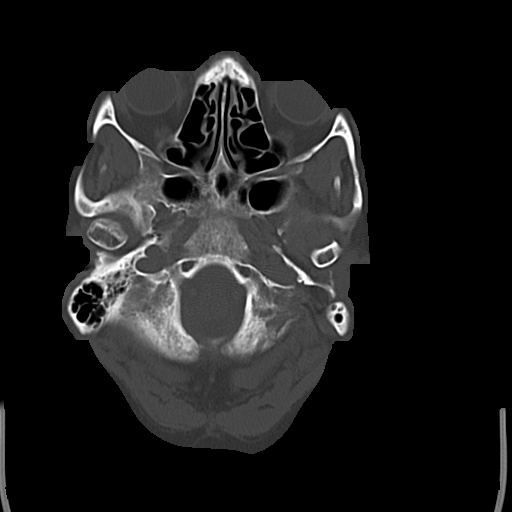
[im 7/30  bone]
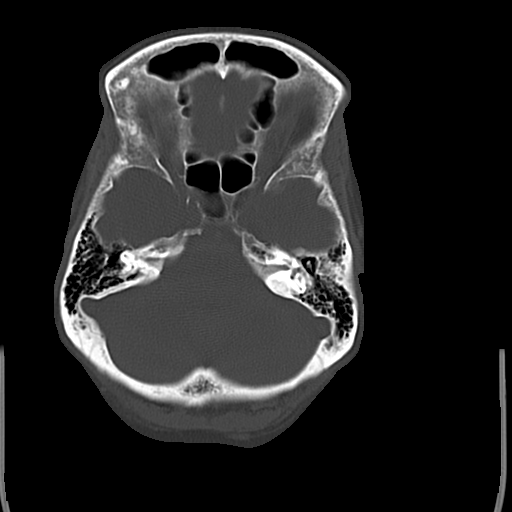
[im 11/30  bone]
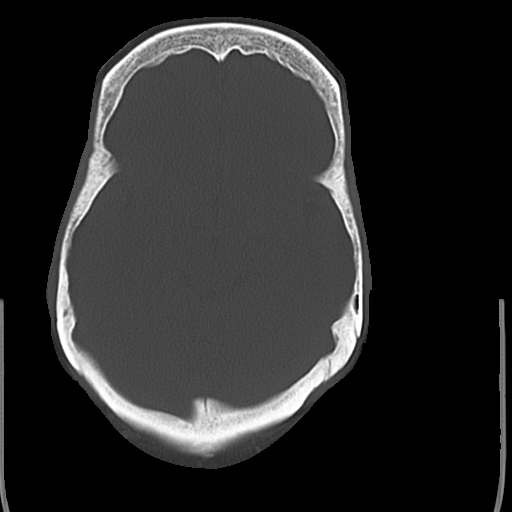

[16 of 30 positions shown; findings below may reference images not displayed]

FINDINGS: Bony calvarium appears intact. No mass effect or midline shift is
noted. Ventricular size is within normal limits. There is no
evidence of mass lesion, hemorrhage or acute infarction.
IMPRESSION: Normal head CT.

## 2014-03-30 MED ORDER — LORAZEPAM 1 MG PO TABS: 1.0000 mg | ORAL_TABLET | Freq: Three times a day (TID) | ORAL | Status: DC | PRN

## 2014-03-30 NOTE — ED Notes (Signed)
[id number] Expected date:  Expected time:  Means of arrival:  Comments: EMS dizziness

## 2014-03-30 NOTE — ED Notes (Signed)
Patient resting quietly, with eyes closed intermittently. Appears in no distress.

## 2014-03-30 NOTE — ED Notes (Signed)
At 17:00, patient reports she experienicing nausea with no vomiting. As she was getting ready for work, patient developed dizziness with her head feeling heavy. Denied any pain. When she got to work, she reports being photosensitive with the bright lights.

## 2014-03-30 NOTE — Discharge Instructions (Signed)
It was our pleasure to provide your ER care today - we hope that you feel better.  Rest. Drink plenty of fluids.  Follow up with primary care doctor tomorrow if symptoms fail to improve/resolve.  Return to ER if worse, new symptoms, severe dizziness, persistent vomiting, chest pain, trouble breathing, other concern.    Dizziness Dizziness is a common problem. It is a feeling of unsteadiness or light-headedness. You may feel like you are about to faint. Dizziness can lead to injury if you stumble or fall. A person of any age group can suffer from dizziness, but dizziness is more common in older adults. CAUSES  Dizziness can be caused by many different things, including:  Middle ear problems.  Standing for too long.  Infections.  An allergic reaction.  Aging.  An emotional response to something, such as the sight of blood.  Side effects of medicines.  Tiredness.  Problems with circulation or blood pressure.  Excessive use of alcohol or medicines, or illegal drug use.  Breathing too fast (hyperventilation).  An irregular heart rhythm (arrhythmia).  A low red blood cell count (anemia).  Pregnancy.  Vomiting, diarrhea, fever, or other illnesses that cause body fluid loss (dehydration).  Diseases or conditions such as Parkinson's disease, high blood pressure (hypertension), diabetes, and thyroid problems.  Exposure to extreme heat. DIAGNOSIS  Your health care provider will ask about your symptoms, perform a physical exam, and perform an electrocardiogram (ECG) to record the electrical activity of your heart. Your health care provider may also perform other heart or blood tests to determine the cause of your dizziness. These may include:  Transthoracic echocardiogram (TTE). During echocardiography, sound waves are used to evaluate how blood flows through your heart.  Transesophageal echocardiogram (TEE).  Cardiac monitoring. This allows your health care provider to  monitor your heart rate and rhythm in real time.  Holter monitor. This is a portable device that records your heartbeat and can help diagnose heart arrhythmias. It allows your health care provider to track your heart activity for several days if needed.  Stress tests by exercise or by giving medicine that makes the heart beat faster. TREATMENT  Treatment of dizziness depends on the cause of your symptoms and can vary greatly. HOME CARE INSTRUCTIONS   Drink enough fluids to keep your urine clear or pale yellow. This is especially important in very hot weather. In older adults, it is also important in cold weather.  Take your medicine exactly as directed if your dizziness is caused by medicines. When taking blood pressure medicines, it is especially important to get up slowly.  Rise slowly from chairs and steady yourself until you feel okay.  In the morning, first sit up on the side of the bed. When you feel okay, stand slowly while holding onto something until you know your balance is fine.  Move your legs often if you need to stand in one place for a long time. Tighten and relax your muscles in your legs while standing.  Have someone stay with you for 1-2 days if dizziness continues to be a problem. Do this until you feel you are well enough to stay alone. Have the person call your health care provider if he or she notices changes in you that are concerning.  Do not drive or use heavy machinery if you feel dizzy.  Do not drink alcohol. SEEK IMMEDIATE MEDICAL CARE IF:   Your dizziness or light-headedness gets worse.  You feel nauseous or vomit.  You  have problems talking, walking, or using your arms, hands, or legs.  You feel weak.  You are not thinking clearly or you have trouble forming sentences. It may take a friend or family member to notice this.  You have chest pain, abdominal pain, shortness of breath, or sweating.  Your vision changes.  You notice any bleeding.  You  have side effects from medicine that seems to be getting worse rather than better. MAKE SURE YOU:   Understand these instructions.  Will watch your condition.  Will get help right away if you are not doing well or get worse. Document Released: 09/28/2000 Document Revised: 04/09/2013 Document Reviewed: 10/22/2010 [facility] Patient Information 2015 [place], [place]. This information is not intended to replace advice given to you by your health care provider. Make sure you discuss any questions you have with your health care provider.

## 2014-03-30 NOTE — ED Provider Notes (Addendum)
CSN: [id number]     Arrival date & time 03/30/14  2027 History   First MD Initiated Contact with Patient 03/30/14 2123     Chief Complaint  Patient presents with  . Dizziness  . Nausea     (Consider location/radiation/quality/duration/timing/severity/associated sxs/prior Treatment) Patient is a 53 y.o. female presenting with dizziness. The history is provided by the patient.  Dizziness Associated symptoms: no blood in stool, no chest pain, no diarrhea, no headaches, no hearing loss, no shortness of breath, no tinnitus and no vomiting   pt c/o dizziness this evening when getting ready for work. 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 States wasn't feeling great, felt mildly dizzy, mildly nauseated. States had not eaten in several hours, and felt she thought symptoms would just pass. Pt was able to get ready, and drive self to work.  At work states felt sensitivity to lights, had gradual onset mild frontal headache, and continued to not feel well so came to ED.  Pt denies same symptoms in past. States dizziness is a gen weak feeling, it is not room spinning, or imbalance. Also denies faintness or feeling as if about to faint. No ear pain, hearing loss or tinnitus. No change in balance, gait or coordination. No loss of normal functional ability. No change in speech or vision. Denies any severe headaches. No neck pain or stiffness. Denies sinus congestion or uri c/o. No fever or chills. No chest pain or discomfort. No unusual doe or fatigue. Denies abd pain. No vomiting or diarrhea. No recent blood loss, no melena or rectal bleeding. No dysuria or gu c/o. No recent change in meds.       Past Medical History  Diagnosis Date  . Eczema   . Thyroid disease   . Hypothyroid    Past Surgical History  Procedure Laterality Date  . Abdominal hysterectomy     Family History  Problem Relation Age of Onset  . Hypertension Mother   . Hypertension Sister    . Hypertension Brother    History  Substance Use Topics  . Smoking status: Current Every Day Smoker -- 1.00 packs/day    Types: Cigarettes  . Smokeless tobacco: Never Used  . Alcohol Use: No   OB History    No data available     Review of Systems  Constitutional: Negative for fever and chills.  HENT: Negative for hearing loss, sinus pressure, sore throat and tinnitus.   Eyes: Negative for pain and visual disturbance.  Respiratory: Negative for shortness of breath.   Cardiovascular: Negative for chest pain.  Gastrointestinal: Negative for vomiting, abdominal pain, diarrhea and blood in stool.  Endocrine: Negative for polyuria.  Genitourinary: Negative for dysuria, flank pain and vaginal bleeding.  Musculoskeletal: Negative for back pain and neck pain.  Skin: Negative for rash.  Neurological: Positive for dizziness. Negative for syncope, speech difficulty, light-headedness, numbness and headaches.  Hematological: Does not bruise/bleed easily.  Psychiatric/Behavioral: Negative for confusion.      Allergies  Dilaudid and Tramadol  Home Medications   Prior to Admission medications   Medication Sig Start Date End Date Taking? Authorizing Provider  Ascorbic Acid (VITAMIN C PO) Take 1 tablet by mouth daily.   Yes Historical Provider, MD  aspirin-acetaminophen-caffeine (EXCEDRIN MIGRAINE) [phone] MG per tablet Take 1 tablet by mouth every 6 (six) hours as needed for headache.   Yes Historical Provider, MD  diphenhydramine-acetaminophen (TYLENOL PM) 25-500 MG TABS Take 0.5-1 tablets  by mouth at bedtime as needed. Pain/sleep   Yes Historical Provider, MD  ibuprofen (ADVIL,MOTRIN) 200 MG tablet Take 800 mg by mouth every 6 (six) hours as needed. pain   Yes Historical Provider, MD  levothyroxine (SYNTHROID, LEVOTHROID) 50 MCG tablet Take 1 tablet (50 mcg total) by mouth daily before breakfast. 10/05/13  Yes Glynn OctaveStephen Rancour, MD  Multiple Vitamins-Minerals (MULTIVITAMIN ADULTS 50+ PO)  Take 1 tablet by mouth daily.   Yes Historical Provider, MD  methocarbamol (ROBAXIN) 500 MG tablet Take 1 tablet (500 mg total) by mouth every 8 (eight) hours as needed for muscle spasms. Patient not taking: Reported on 03/30/2014 11/15/13   Donnita FallsMercedes Strupp Camprubi-Soms, PA-C  naproxen (NAPROSYN) 500 MG tablet Take 1 tablet (500 mg total) by mouth 2 (two) times daily as needed for mild pain, moderate pain or headache (TAKE WITH MEALS.). Patient not taking: Reported on 03/30/2014 11/15/13   Donnita FallsMercedes Strupp Camprubi-Soms, PA-C  oxyCODONE-acetaminophen (PERCOCET) 5-325 MG per tablet Take 1-2 tablets by mouth every 6 (six) hours as needed for severe pain. Patient not taking: Reported on 03/30/2014 11/15/13   Donnita FallsMercedes Strupp Camprubi-Soms, PA-C   BP 124/62 mmHg  Pulse 89  Temp(Src) 97.4 F (36.3 C) (Oral)  Resp 19  Ht 5\' 9"  (1.753 m)  Wt 213 lb (96.616 kg)  BMI 31.44 kg/m2  SpO2 98% Physical Exam  Constitutional: She is oriented to person, place, and time. She appears well-developed and well-nourished. No distress.  HENT:  Head: Atraumatic.  Nose: Nose normal.  Mouth/Throat: Oropharynx is clear and moist.  No sinus or temporal tenderness.  Eyes: Conjunctivae and EOM are normal. Pupils are equal, round, and reactive to light. No scleral icterus.  Neck: Normal range of motion. Neck supple. No tracheal deviation present. No thyromegaly present.  No stiffness or rigidity. No bruit.  Cardiovascular: Normal rate, regular rhythm, normal heart sounds and intact distal pulses.  Exam reveals no gallop and no friction rub.   No murmur heard. Pulmonary/Chest: Effort normal and breath sounds normal. No respiratory distress.  Abdominal: Soft. Normal appearance and bowel sounds are normal. She exhibits no distension and no mass. There is no tenderness. There is no rebound and no guarding.  Genitourinary:  No cva tenderness.  Musculoskeletal: Normal range of motion. She exhibits no edema or tenderness.   Neurological: She is alert and oriented to person, place, and time. No cranial nerve deficit.  Speech fluent. No aphasia or dysarthria. No pronator drift. Finger to nose normal bil. Motor intact bilaterally. stre 5/5. sens intact.  Steady gait.   Skin: Skin is warm and dry. No rash noted. She is not diaphoretic.  Psychiatric: She has a normal mood and affect.  Nursing note and vitals reviewed.   ED Course  Procedures (including critical care time) Labs Review  Results for orders placed or performed during the hospital encounter of 03/30/14  CBC  Result Value Ref Range   WBC 6.0 4.0 - 10.5 K/uL   RBC 4.78 3.87 - 5.11 MIL/uL   Hemoglobin 14.7 12.0 - 15.0 g/dL   HCT 60.443.8 54.036.0 - 98.146.0 %   MCV 91.6 78.0 - 100.0 fL   MCH 30.8 26.0 - 34.0 pg   MCHC 33.6 30.0 - 36.0 g/dL   RDW 19.113.8 47.811.5 - 29.515.5 %   Platelets 319 150 - 400 K/uL  Comprehensive metabolic panel  Result Value Ref Range   Sodium 142 137 - 147 mEq/L   Potassium 3.7 3.7 - 5.3 mEq/L   Chloride 105  96 - 112 mEq/L   CO2 24 19 - 32 mEq/L   Glucose, Bld 110 (H) 70 - 99 mg/dL   BUN 11 6 - 23 mg/dL   Creatinine, Ser 8.290.82 0.50 - 1.10 mg/dL   Calcium 56.210.0 8.4 - 13.010.5 mg/dL   Total Protein 7.2 6.0 - 8.3 g/dL   Albumin 4.4 3.5 - 5.2 g/dL   AST 21 0 - 37 U/L   ALT 18 0 - 35 U/L   Alkaline Phosphatase 55 39 - 117 U/L   Total Bilirubin 0.4 0.3 - 1.2 mg/dL   GFR calc non Af Amer 79 (L) >90 mL/min   GFR calc Af Amer >90 >90 mL/min   Anion gap 13 5 - 15  Troponin I  Result Value Ref Range   Troponin I <0.30 <0.30 ng/mL   Ct Head Wo Contrast  03/30/2014   CLINICAL DATA:  Dizziness.  EXAM: CT HEAD WITHOUT CONTRAST  TECHNIQUE: Contiguous axial images were obtained from the base of the skull through the vertex without intravenous contrast.  COMPARISON:  None.  FINDINGS: Bony calvarium appears intact. No mass effect or midline shift is noted. Ventricular size is within normal limits. There is no evidence of mass lesion, hemorrhage or acute  infarction.  IMPRESSION: Normal head CT.   Electronically Signed   By: Roque LiasJames  Green M.D.   On: 03/30/2014 22:56     ECG - ecg not crossing over into muse/epic link, so recorded below.   Date: 03/30/2014  Rate: 68  Rhythm: normal sinus rhythm  QRS Axis: normal  Intervals: normal  ST/T Wave abnormalities: normal  Conduction Disutrbances:none  Narrative Interpretation:   Old EKG Reviewed: unchanged    MDM   Labs. Imaging.  Reviewed nursing notes and prior charts for additional history.   Pt eating and drinking.  Recheck pt, no faintness or dizziness. Ambulates w steady gait.  Pt appears stable for d/c.  At d/c, patient requests for rx ativan for home.     Suzi RootsKevin E Rabia Argote, MD 03/30/14 [phone]

## 2014-03-30 NOTE — ED Notes (Signed)
Dizziness is constant. Denies headache but feels her head is heavy. Photosensitive.

## 2015-04-12 ENCOUNTER — Emergency Department (HOSPITAL_COMMUNITY)
Admission: EM | Admit: 2015-04-12 | Discharge: 2015-04-12 | Disposition: A | Discharge status: Home / Self Care | Payer: PRIVATE HEALTH INSURANCE | Attending: Emergency Medicine | Admitting: Emergency Medicine

## 2015-04-12 ENCOUNTER — Emergency Department (HOSPITAL_COMMUNITY): Payer: PRIVATE HEALTH INSURANCE

## 2015-04-12 ENCOUNTER — Encounter (HOSPITAL_COMMUNITY): Payer: Self-pay

## 2015-04-12 DIAGNOSIS — S6992XA Unspecified injury of left wrist, hand and finger(s), initial encounter: Secondary | ICD-10-CM | POA: Insufficient documentation

## 2015-04-12 DIAGNOSIS — S8002XA Contusion of left knee, initial encounter: Secondary | ICD-10-CM

## 2015-04-12 DIAGNOSIS — S6991XA Unspecified injury of right wrist, hand and finger(s), initial encounter: Secondary | ICD-10-CM | POA: Insufficient documentation

## 2015-04-12 DIAGNOSIS — S3992XA Unspecified injury of lower back, initial encounter: Secondary | ICD-10-CM | POA: Insufficient documentation

## 2015-04-12 DIAGNOSIS — Z79899 Other long term (current) drug therapy: Secondary | ICD-10-CM | POA: Insufficient documentation

## 2015-04-12 DIAGNOSIS — W19XXXA Unspecified fall, initial encounter: Secondary | ICD-10-CM

## 2015-04-12 DIAGNOSIS — E039 Hypothyroidism, unspecified: Secondary | ICD-10-CM | POA: Insufficient documentation

## 2015-04-12 DIAGNOSIS — M25532 Pain in left wrist: Secondary | ICD-10-CM

## 2015-04-12 DIAGNOSIS — M25531 Pain in right wrist: Secondary | ICD-10-CM

## 2015-04-12 DIAGNOSIS — Y9389 Activity, other specified: Secondary | ICD-10-CM | POA: Insufficient documentation

## 2015-04-12 DIAGNOSIS — Y92512 Supermarket, store or market as the place of occurrence of the external cause: Secondary | ICD-10-CM | POA: Insufficient documentation

## 2015-04-12 DIAGNOSIS — F17210 Nicotine dependence, cigarettes, uncomplicated: Secondary | ICD-10-CM | POA: Insufficient documentation

## 2015-04-12 DIAGNOSIS — M79641 Pain in right hand: Secondary | ICD-10-CM

## 2015-04-12 DIAGNOSIS — W010XXA Fall on same level from slipping, tripping and stumbling without subsequent striking against object, initial encounter: Secondary | ICD-10-CM | POA: Insufficient documentation

## 2015-04-12 DIAGNOSIS — Z872 Personal history of diseases of the skin and subcutaneous tissue: Secondary | ICD-10-CM | POA: Insufficient documentation

## 2015-04-12 DIAGNOSIS — Y998 Other external cause status: Secondary | ICD-10-CM | POA: Insufficient documentation

## 2015-04-12 DIAGNOSIS — S8001XA Contusion of right knee, initial encounter: Secondary | ICD-10-CM | POA: Insufficient documentation

## 2015-04-12 IMAGING — CR DG KNEE COMPLETE 4+V*L*
4 series · 4 of 4 positions shown · non-contrast
Comparison: None.

CLINICAL DATA: Status post fall last night with left knee pain

EXAM:
LEFT KNEE - COMPLETE 4+ VIEW

[t knee ap left]
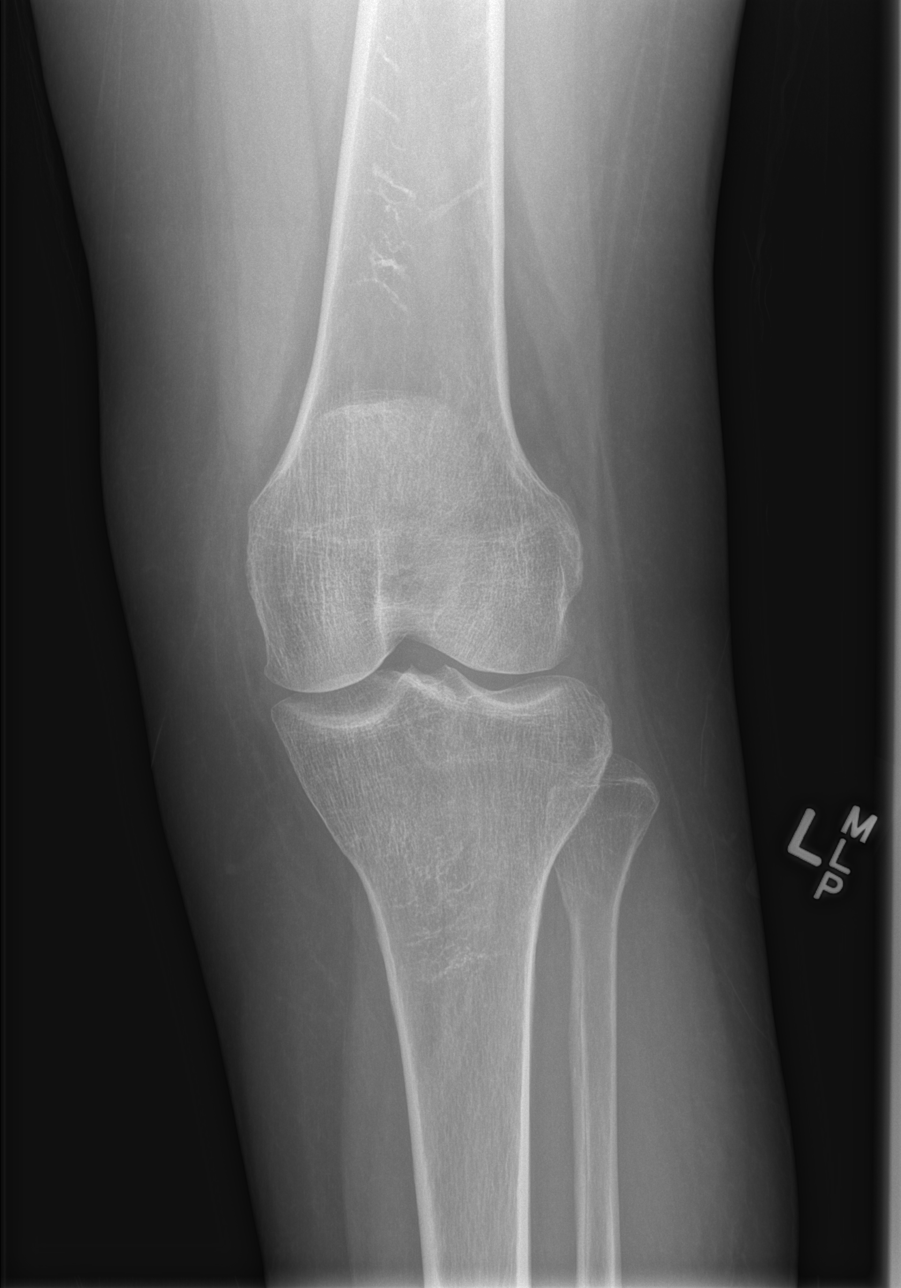

[t knee obl left (1 of 2)]
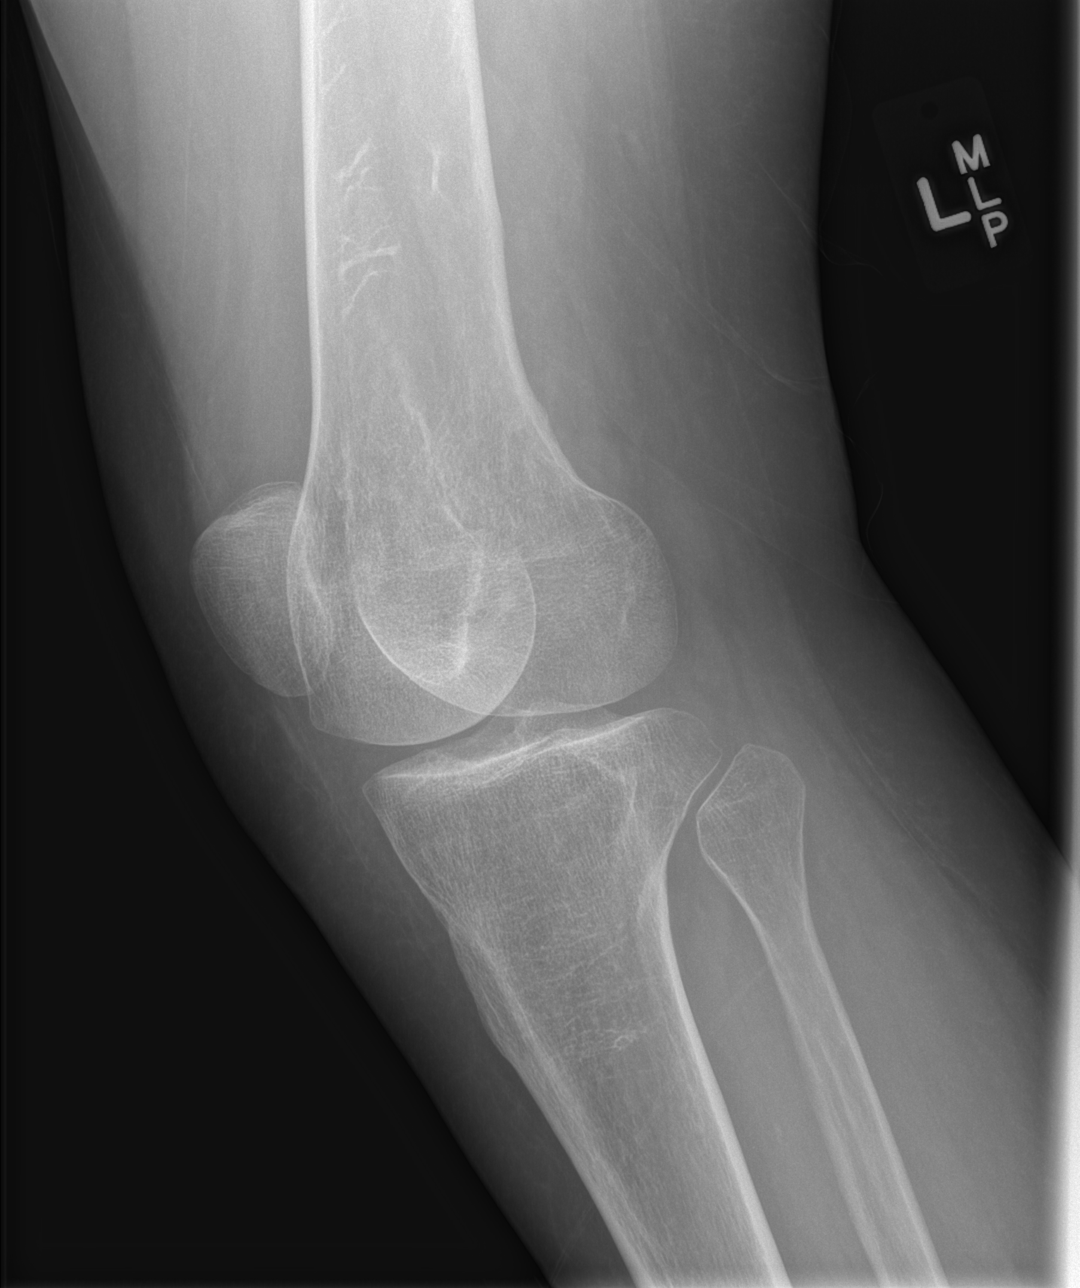

[t knee obl left (2 of 2)]
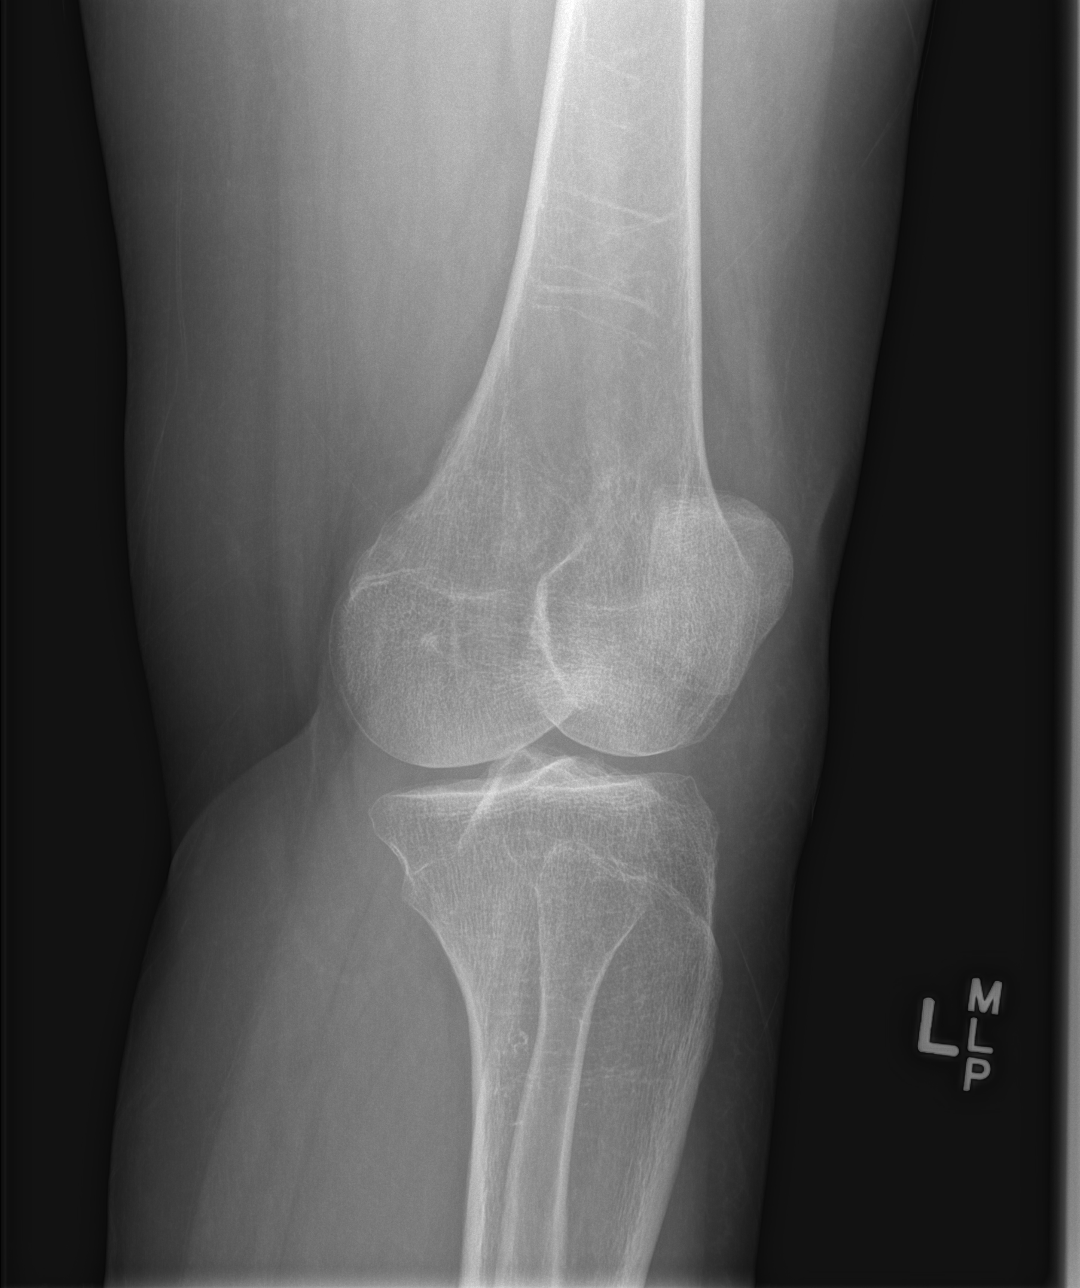

[t knee lat left]
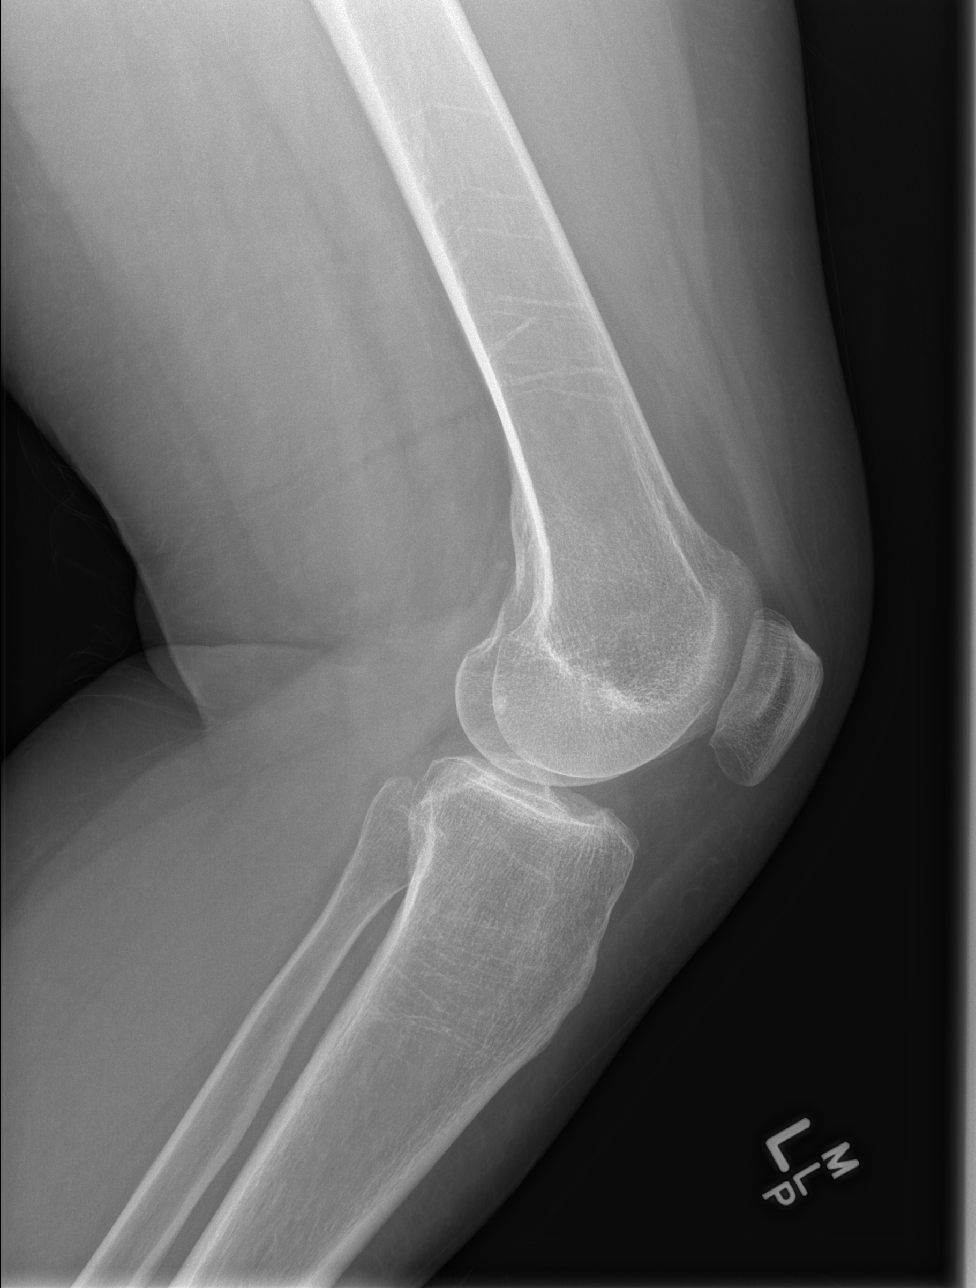

[4 of 4 positions shown; findings below may reference images not displayed]

FINDINGS: There is no evidence of fracture, dislocation, or joint effusion.
There is no evidence of arthropathy or other focal bone abnormality.
Soft tissues are unremarkable.
IMPRESSION: Negative.

## 2015-04-12 IMAGING — CR DG WRIST COMPLETE 3+V*L*
4 series · 4 of 4 positions shown · non-contrast
Comparison: None.

CLINICAL DATA: Status post fall last night with left wrist pain

EXAM:
LEFT WRIST - COMPLETE 3+ VIEW

[x wrist pa left]
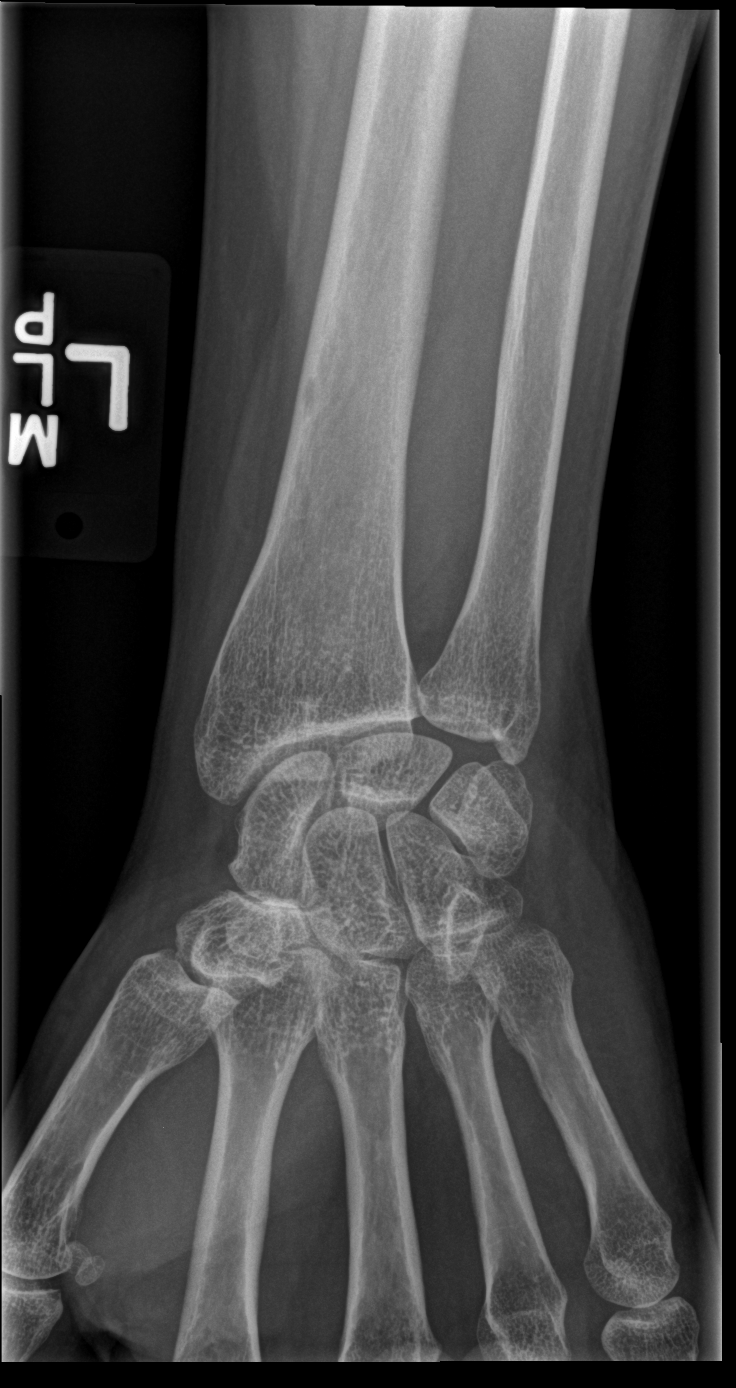

[x wrist obl left (1 of 2)]
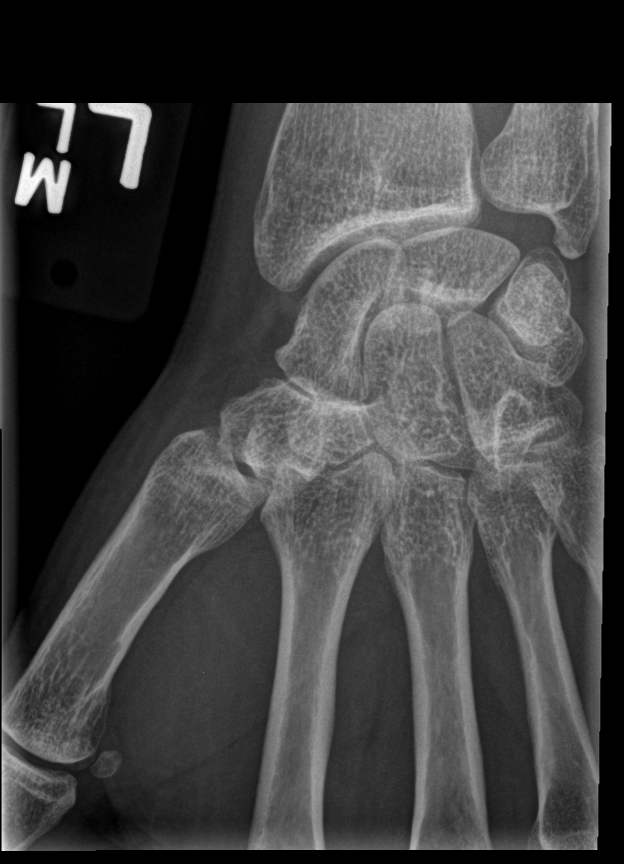

[x wrist obl left (2 of 2)]
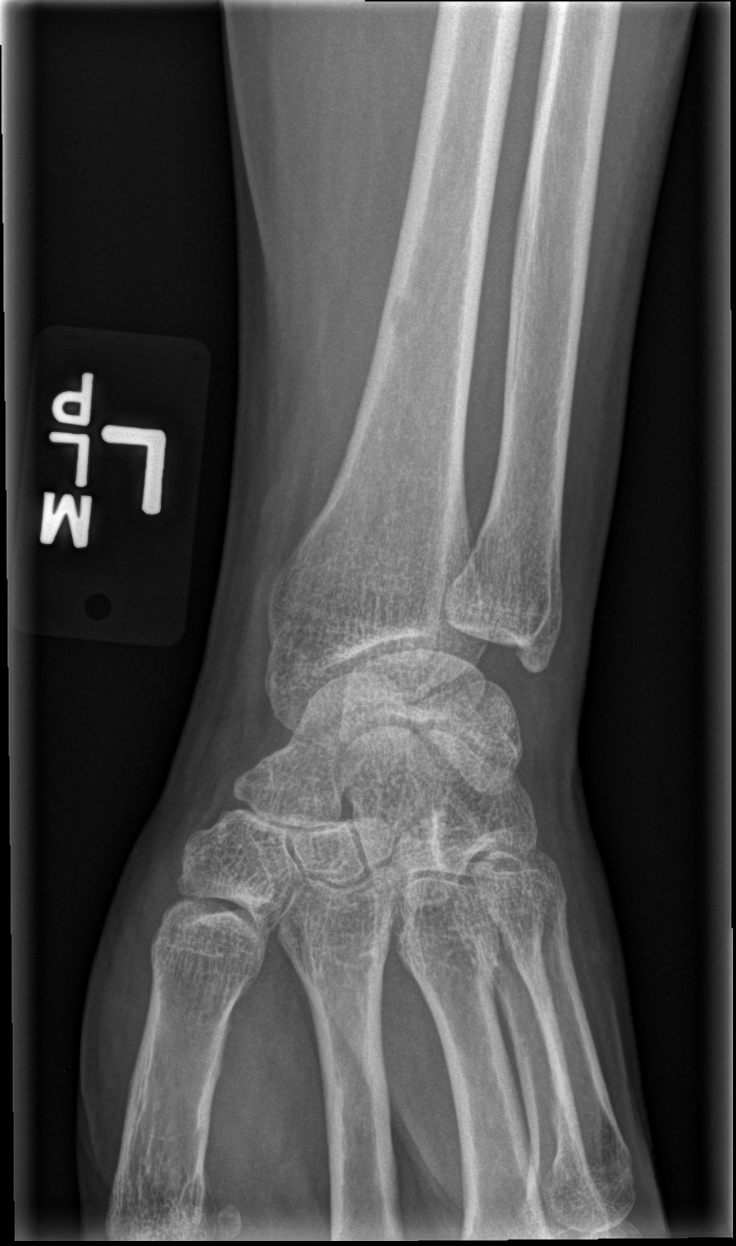

[x wrist lat left]
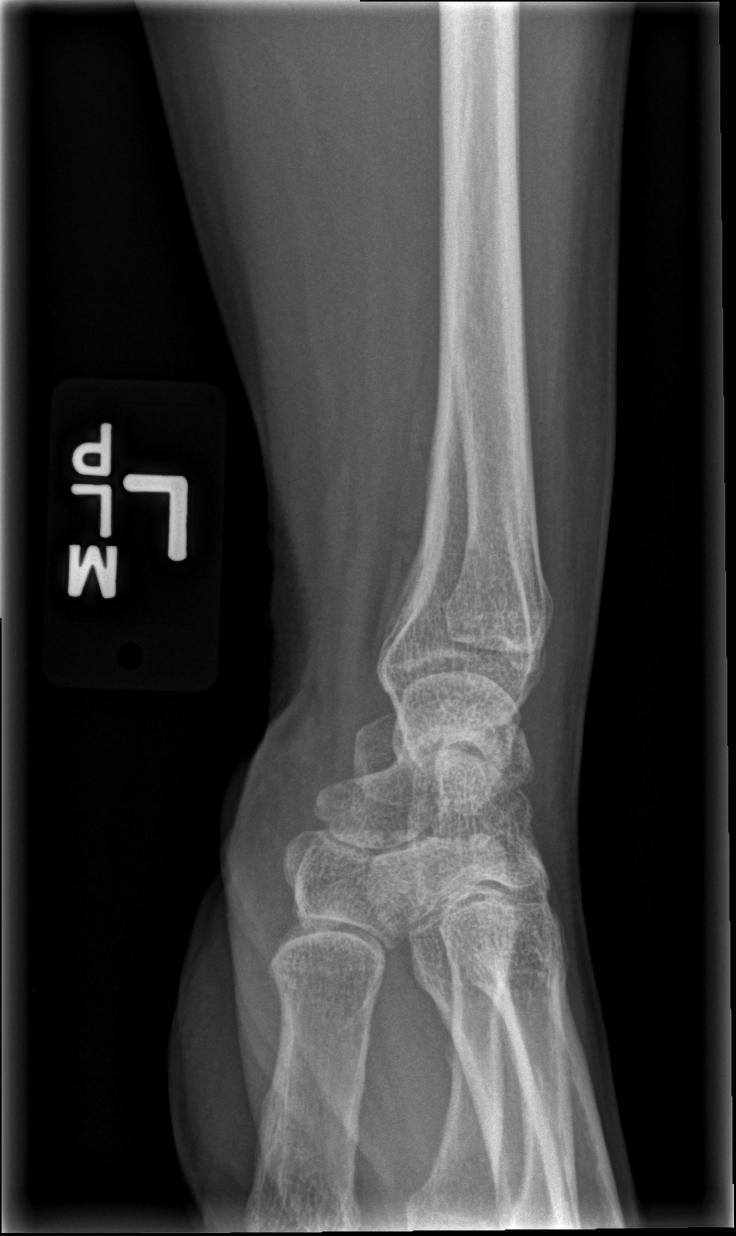

[4 of 4 positions shown; findings below may reference images not displayed]

FINDINGS: There is no evidence of fracture or dislocation. There is no
evidence of arthropathy or other focal bone abnormality. Soft
tissues are unremarkable.
IMPRESSION: Negative.

## 2015-04-12 IMAGING — CR DG HAND COMPLETE 3+V*R*
3 series · 3 of 3 positions shown · non-contrast
Comparison: None.

CLINICAL DATA: Status post fall last night with right hand pain.

EXAM:
RIGHT HAND - COMPLETE 3+ VIEW

[x hand pa right]
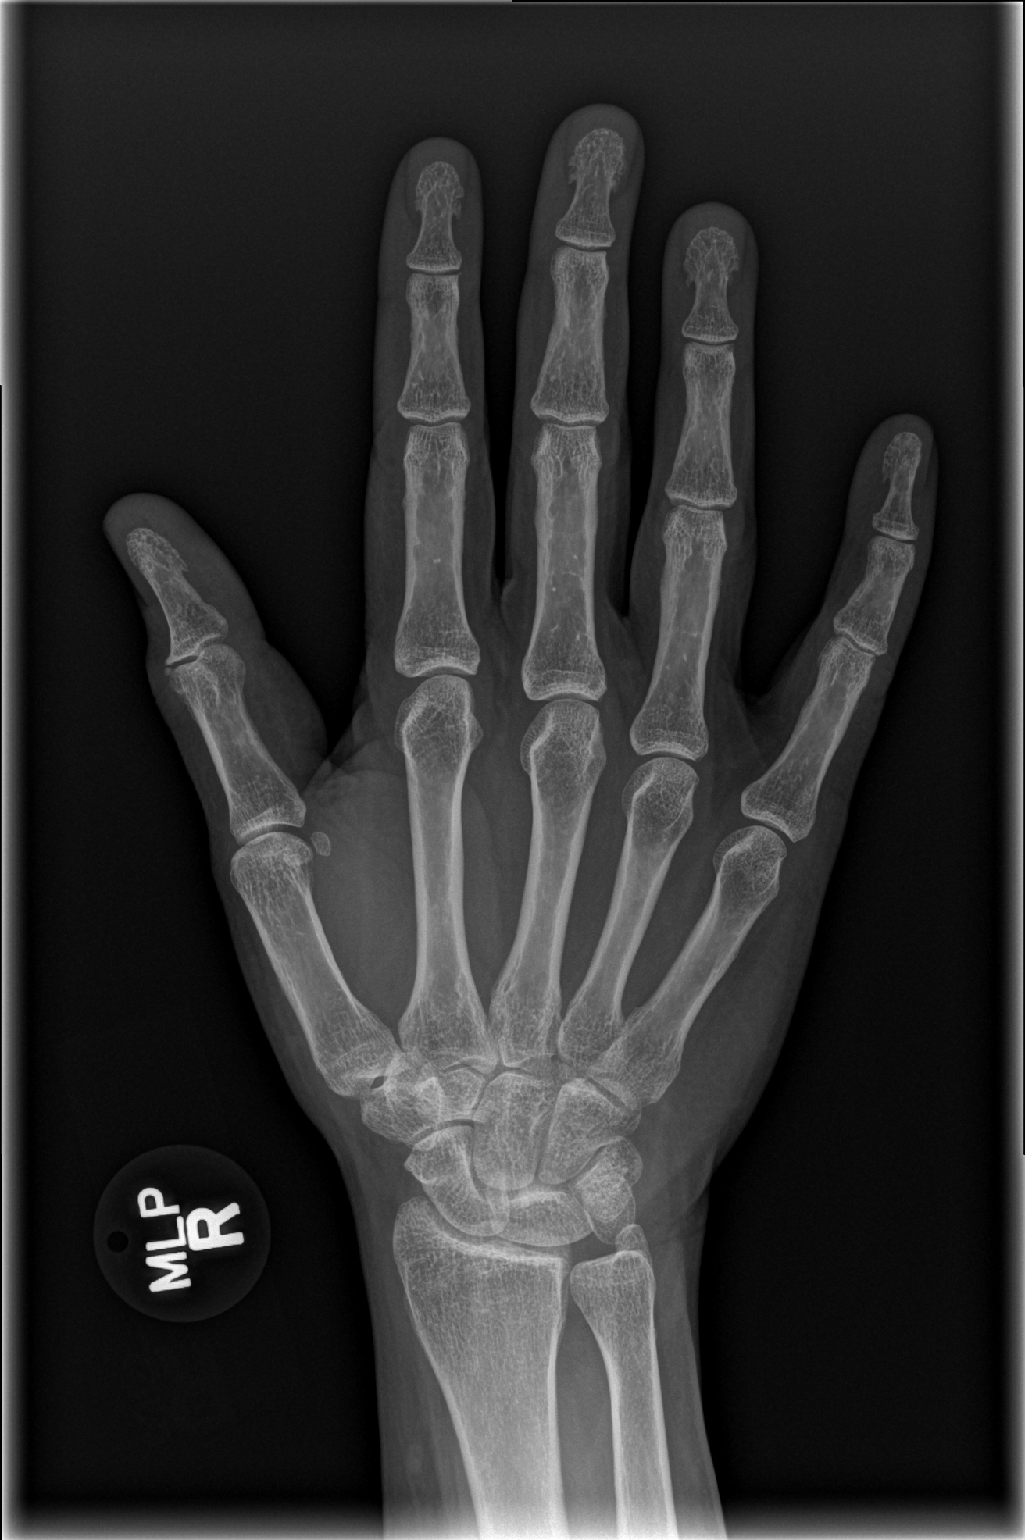

[x hand obl right]
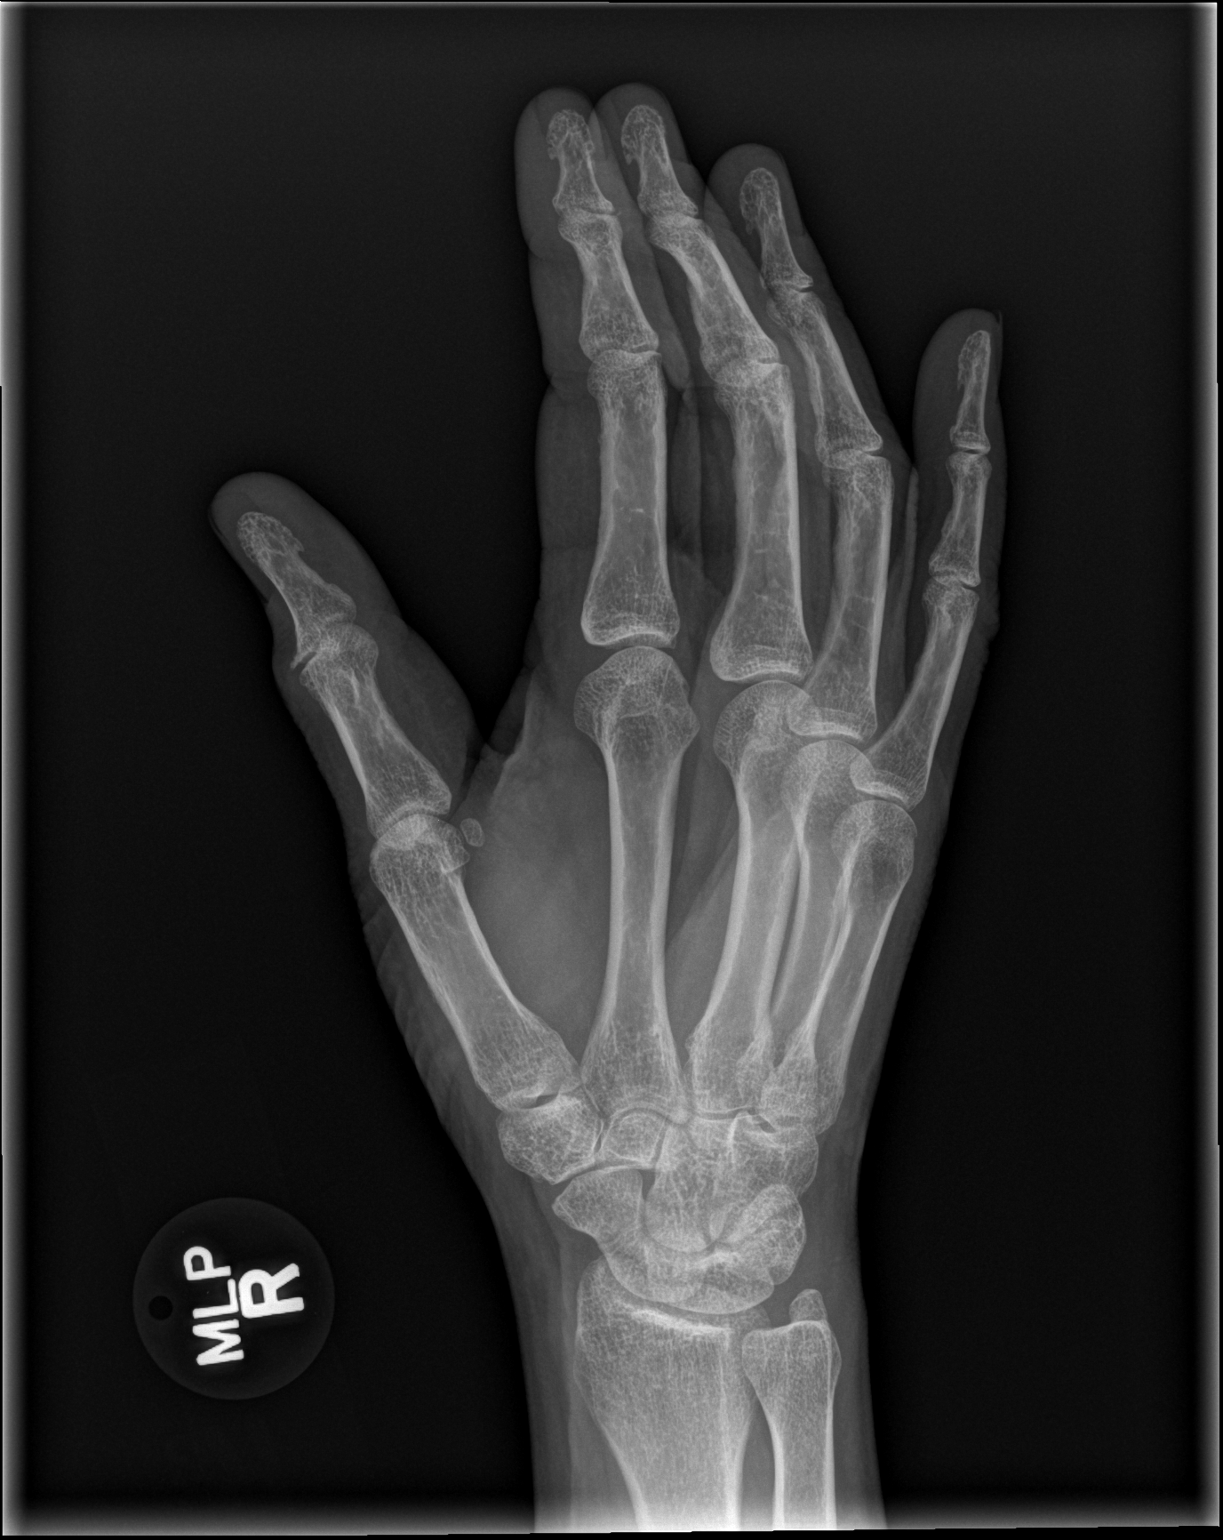

[x hand lat right]
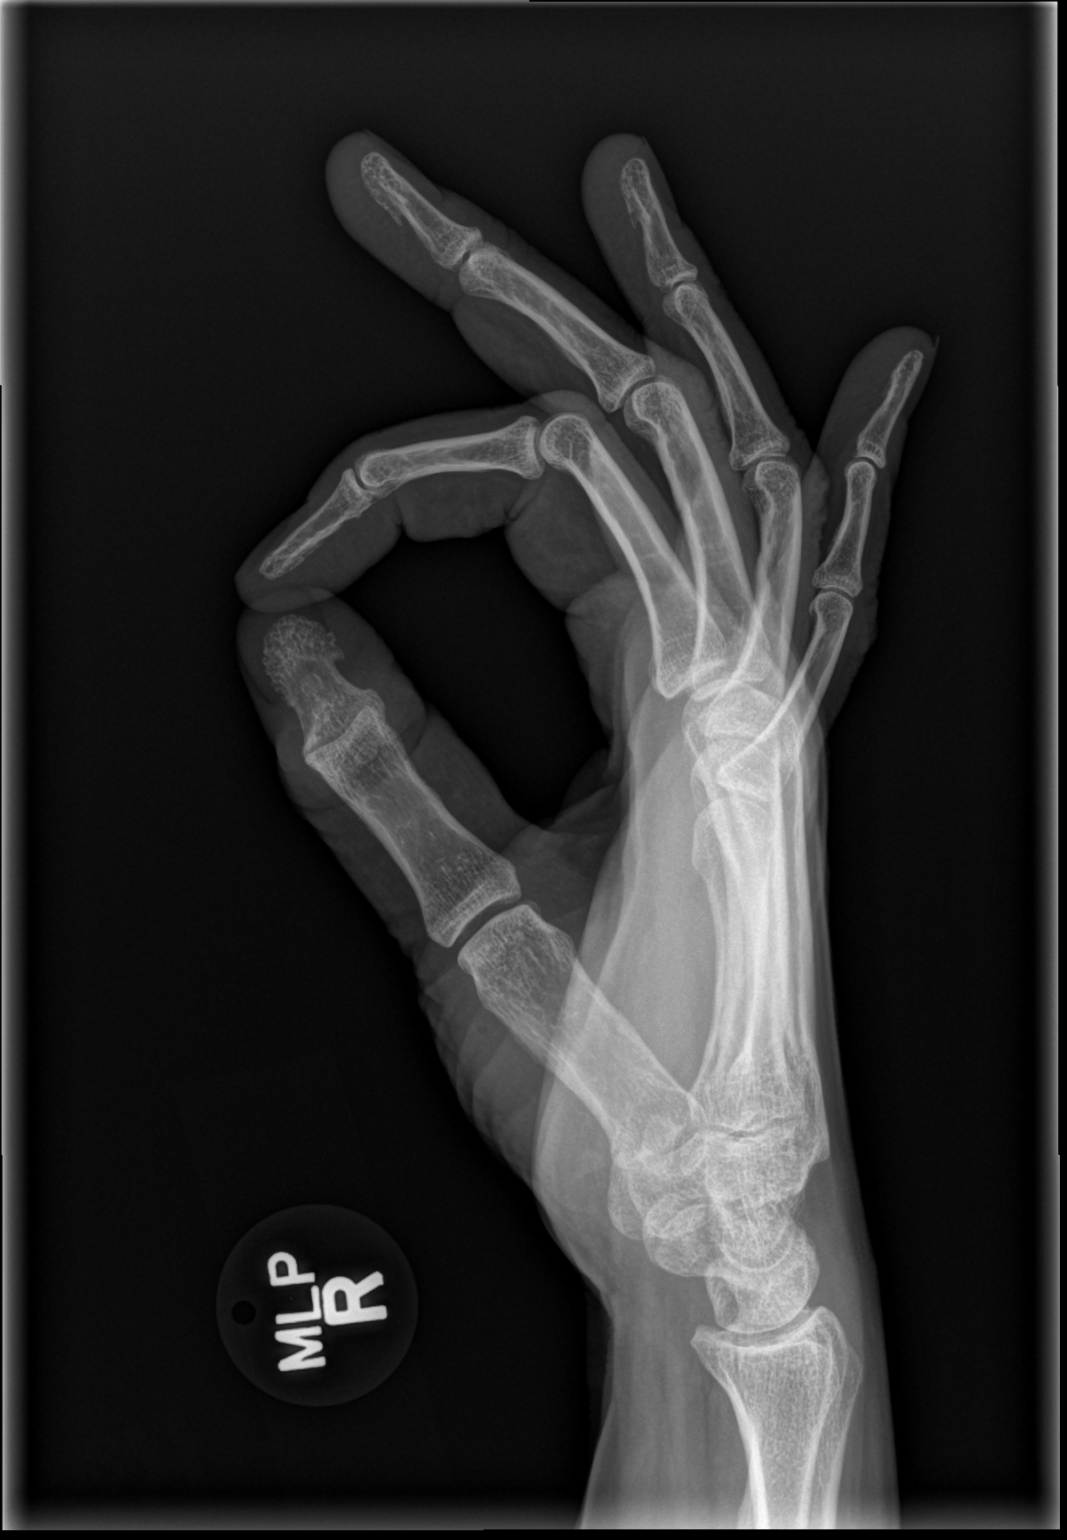

[3 of 3 positions shown; findings below may reference images not displayed]

FINDINGS: There is no evidence of fracture or dislocation. There is no
evidence of arthropathy or other focal bone abnormality. Soft
tissues are unremarkable.
IMPRESSION: Negative.

## 2015-04-12 IMAGING — CR DG ELBOW COMPLETE 3+V*L*
4 series · 4 of 4 positions shown · non-contrast
Comparison: None.

CLINICAL DATA: Status post fall last night with left elbow pain

EXAM:
LEFT ELBOW - COMPLETE 3+ VIEW

[x elbow ap left]
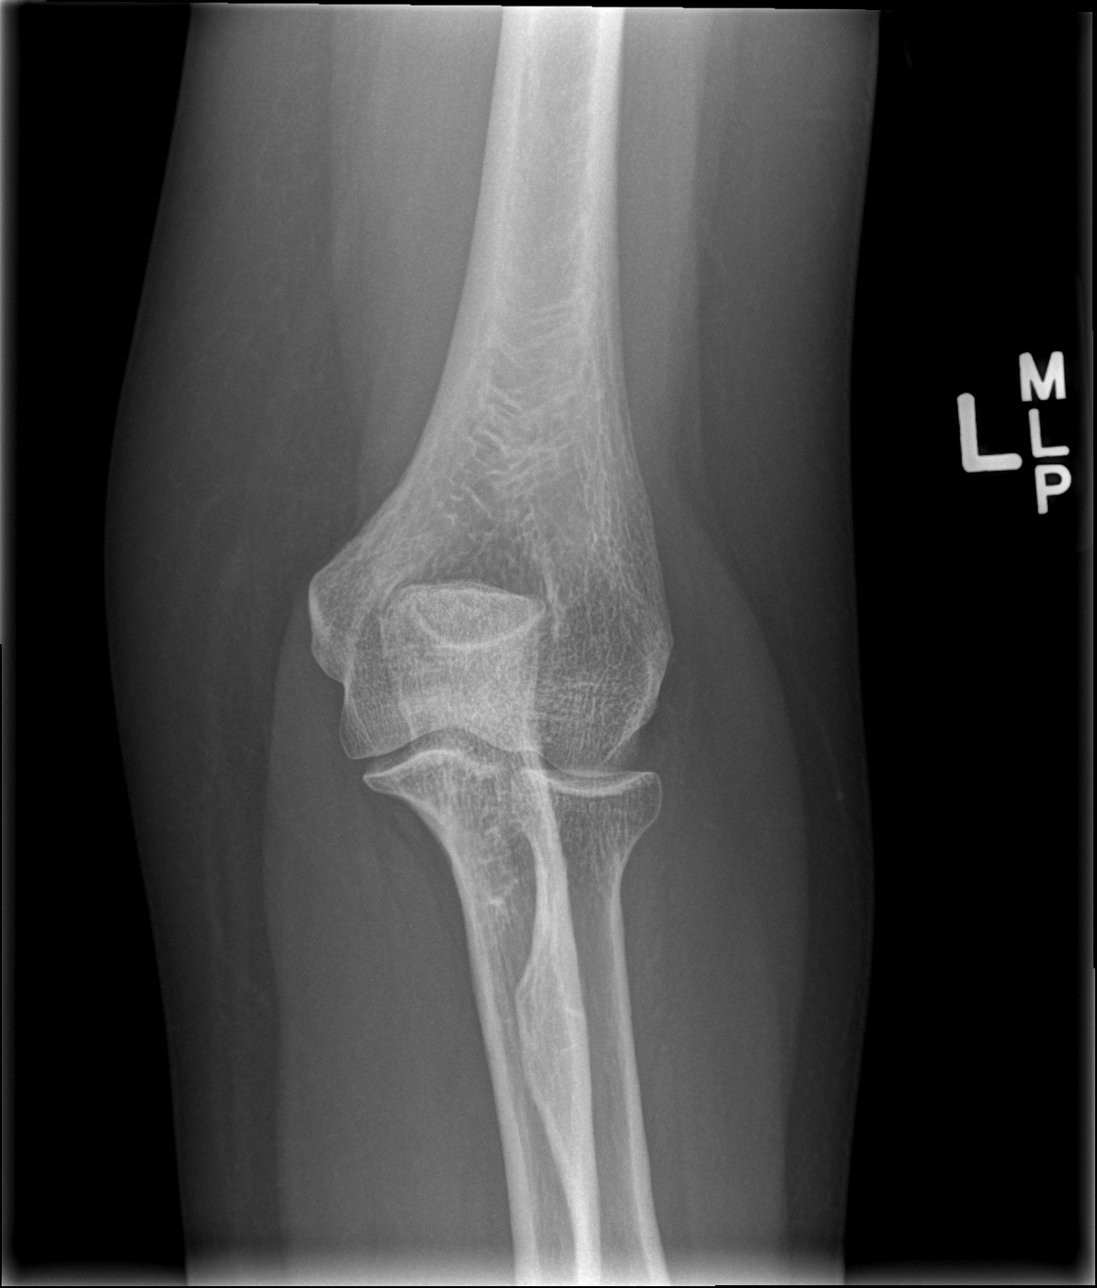

[x elbow obl left (1 of 2)]
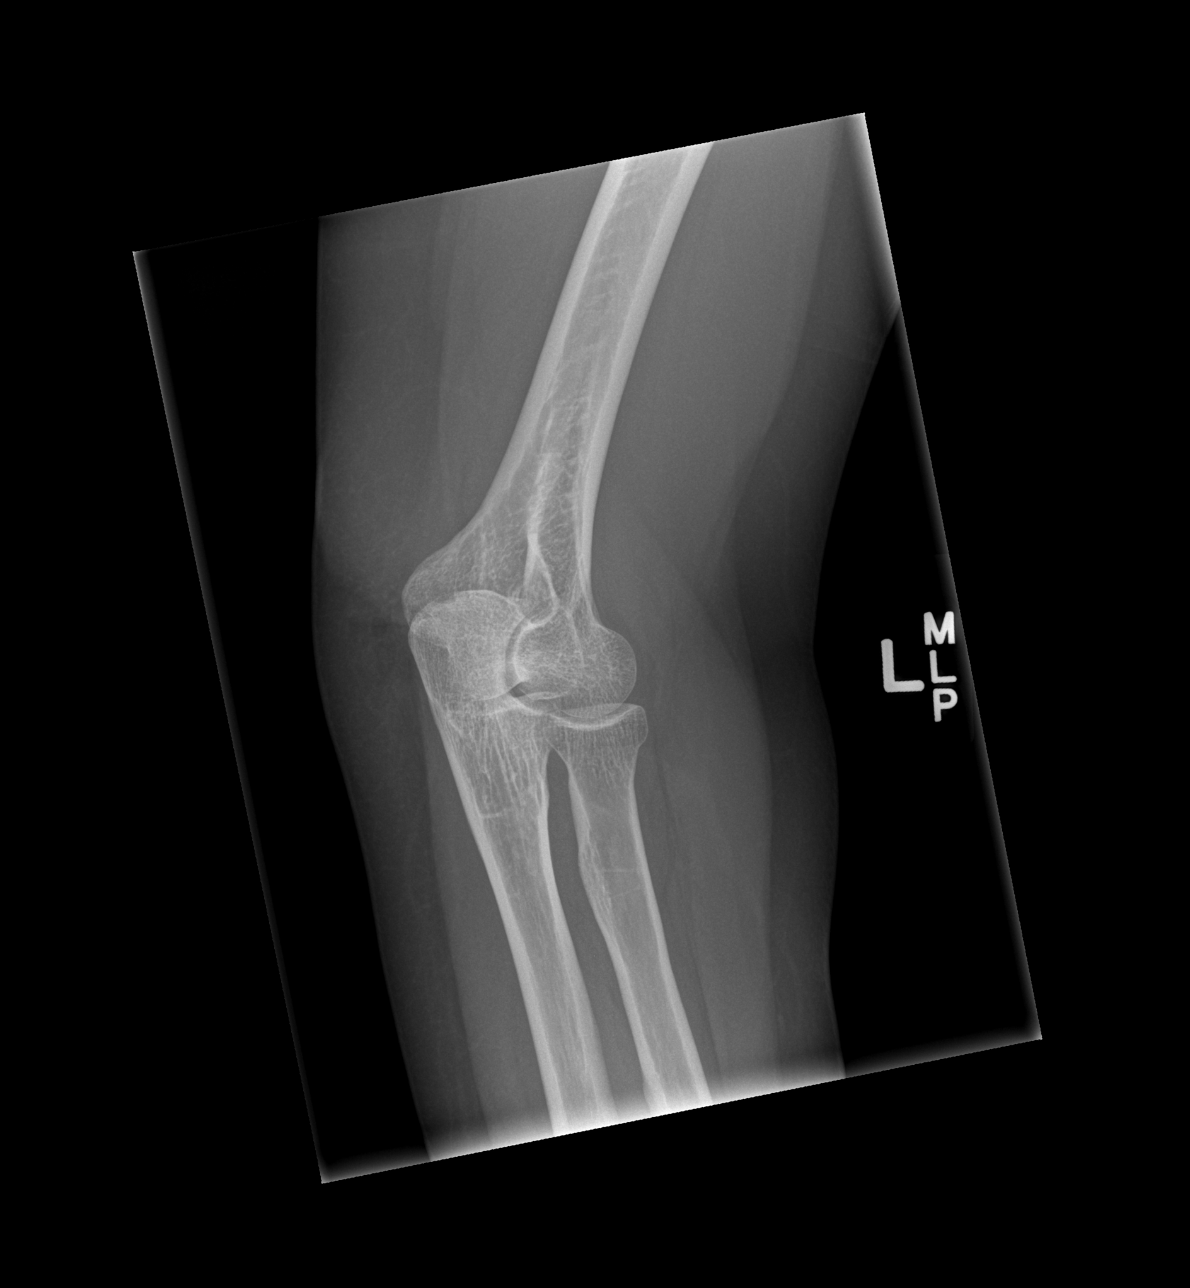

[x elbow obl left (2 of 2)]
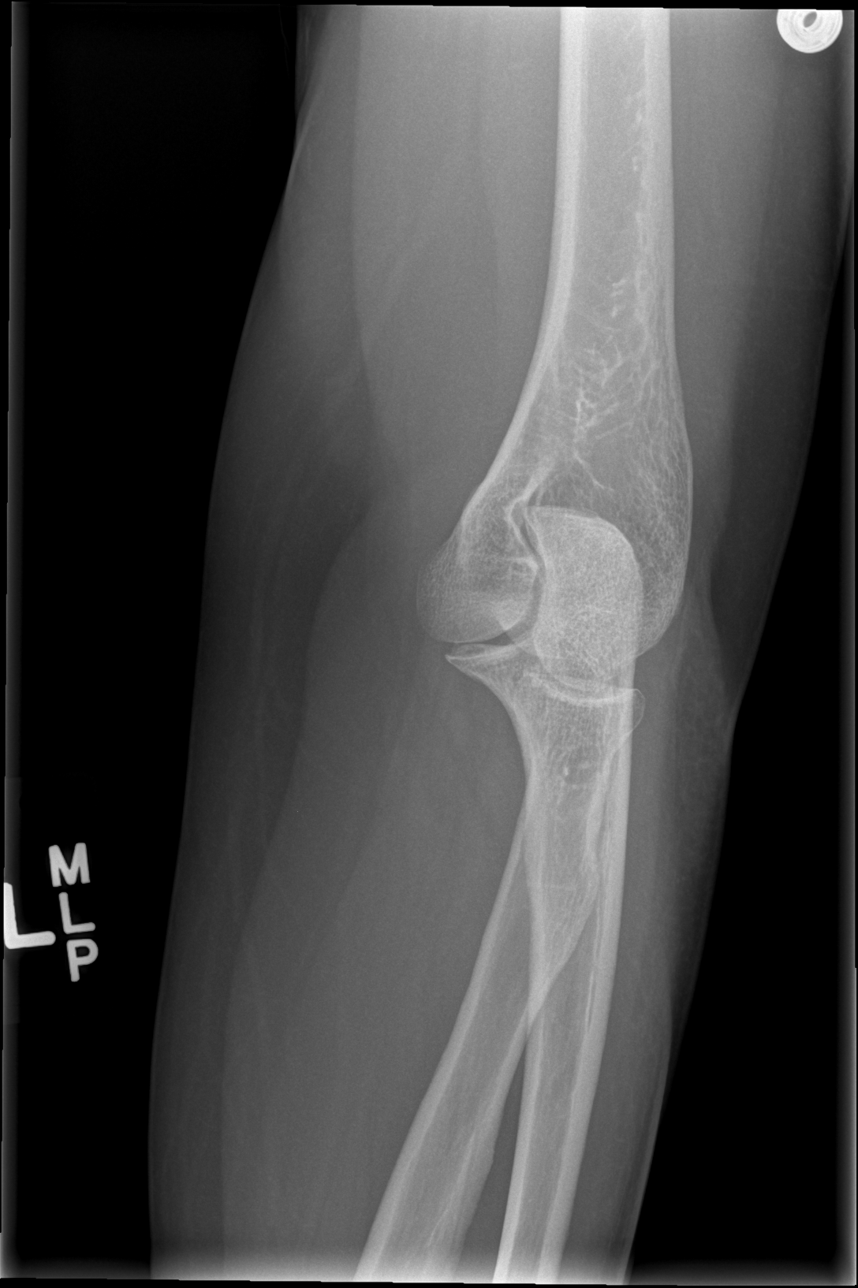

[x elbow lat left]
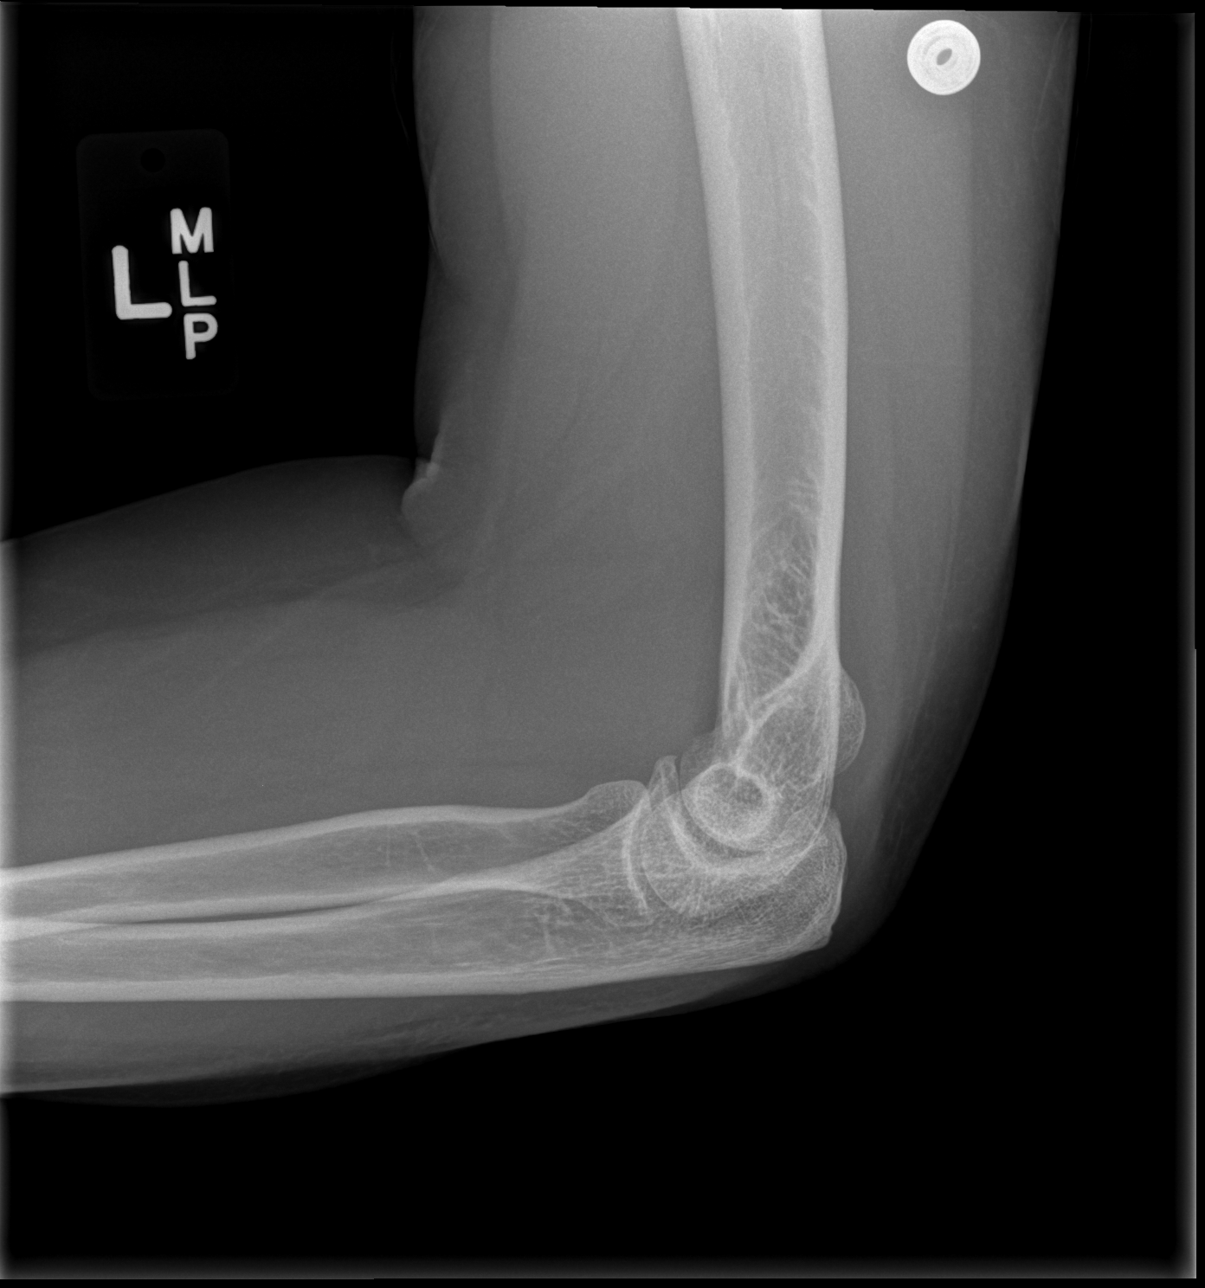

[4 of 4 positions shown; findings below may reference images not displayed]

FINDINGS: There is no evidence of fracture, dislocation, or joint effusion.
There is no evidence of arthropathy or other focal bone abnormality.
Soft tissues are unremarkable.
IMPRESSION: Negative.

## 2015-04-12 IMAGING — CR DG WRIST COMPLETE 3+V*R*
4 series · 4 of 4 positions shown · non-contrast
Comparison: None.

CLINICAL DATA: Status post fall last night with right wrist pain

EXAM:
RIGHT WRIST - COMPLETE 3+ VIEW

[x wrist pa right]
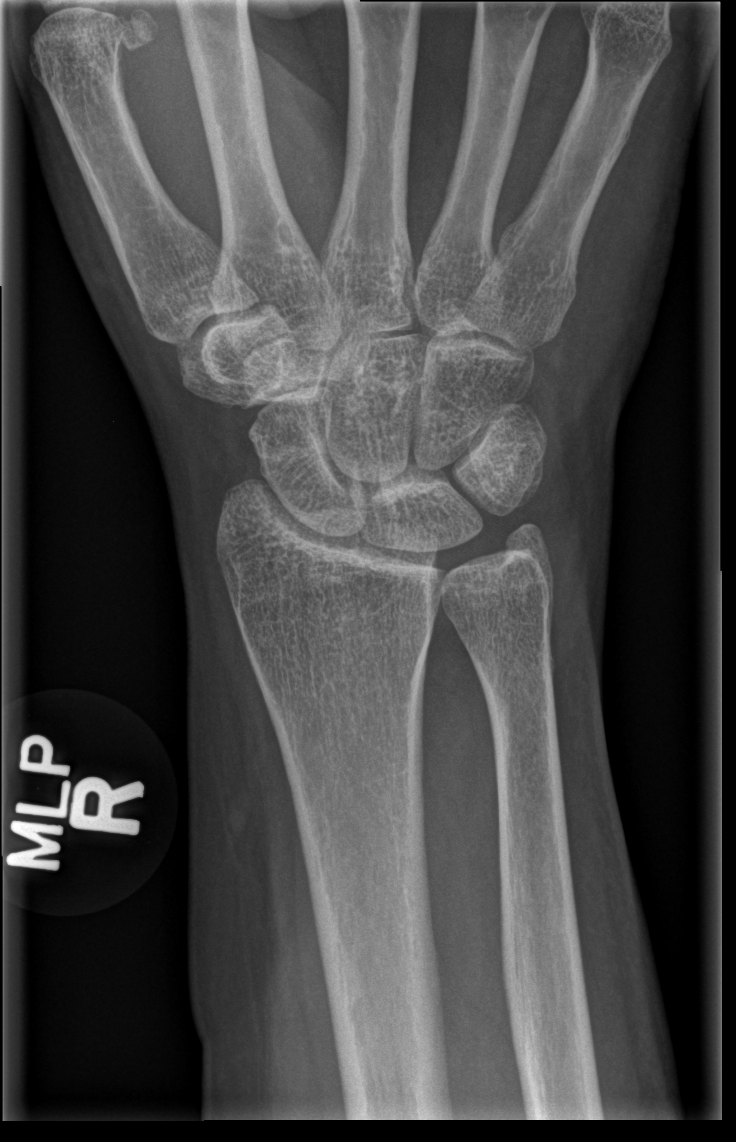

[x wrist navicular view right]
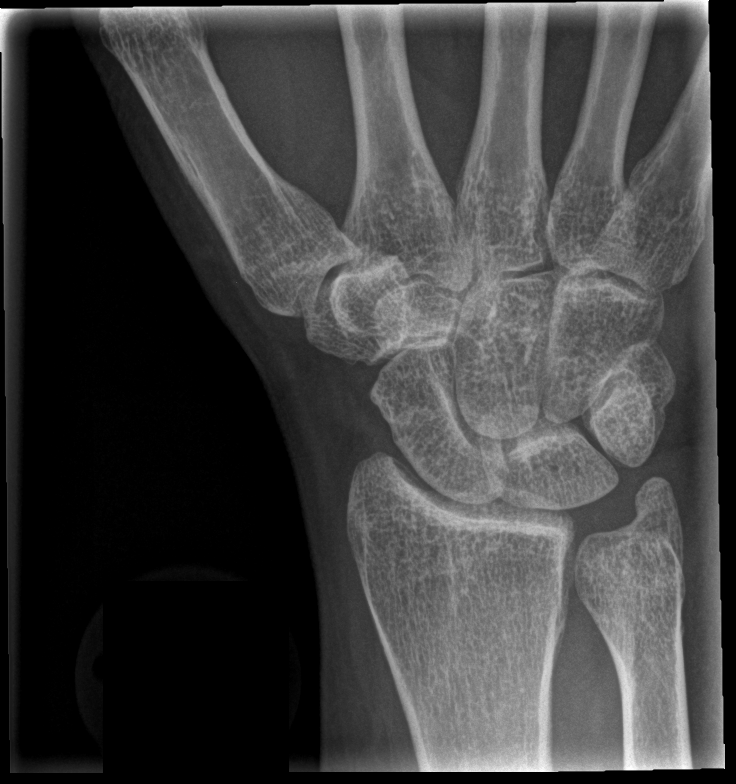

[x wrist obl right]
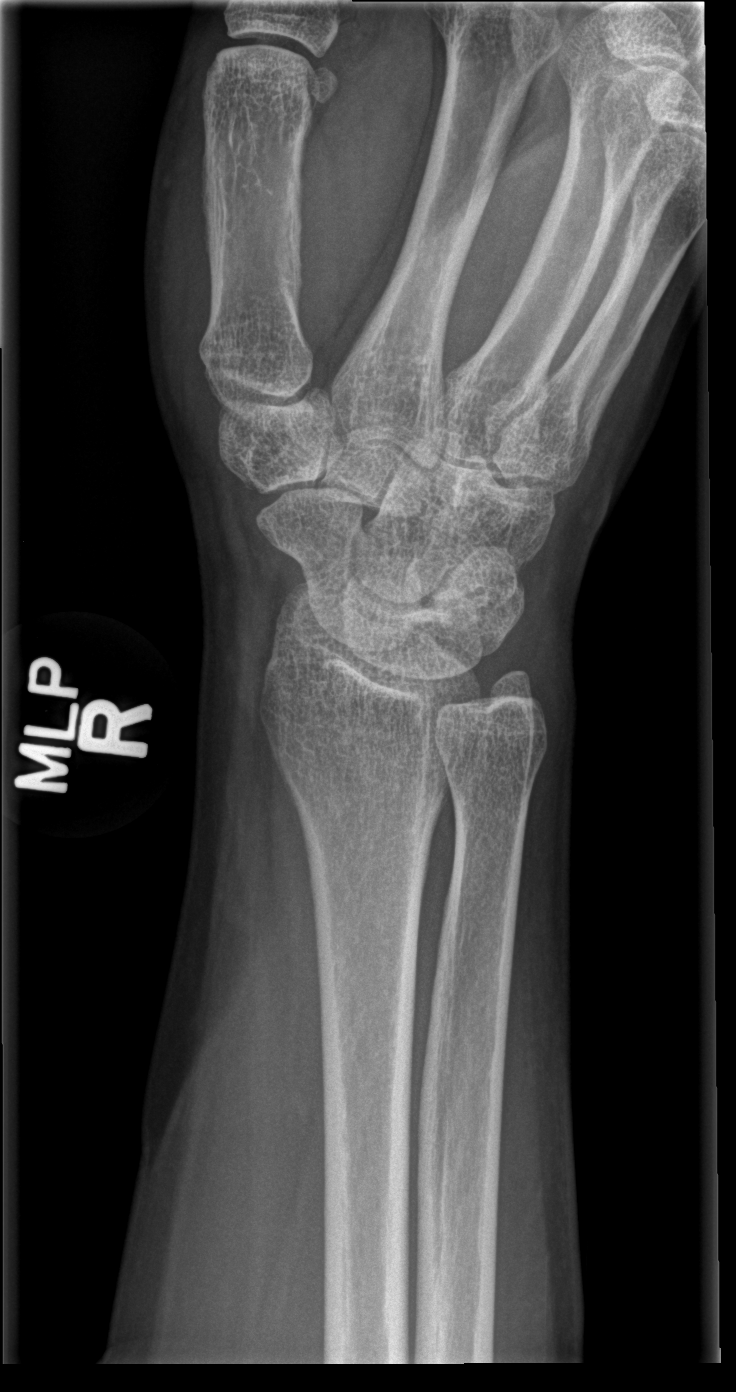

[x wrist lat right]
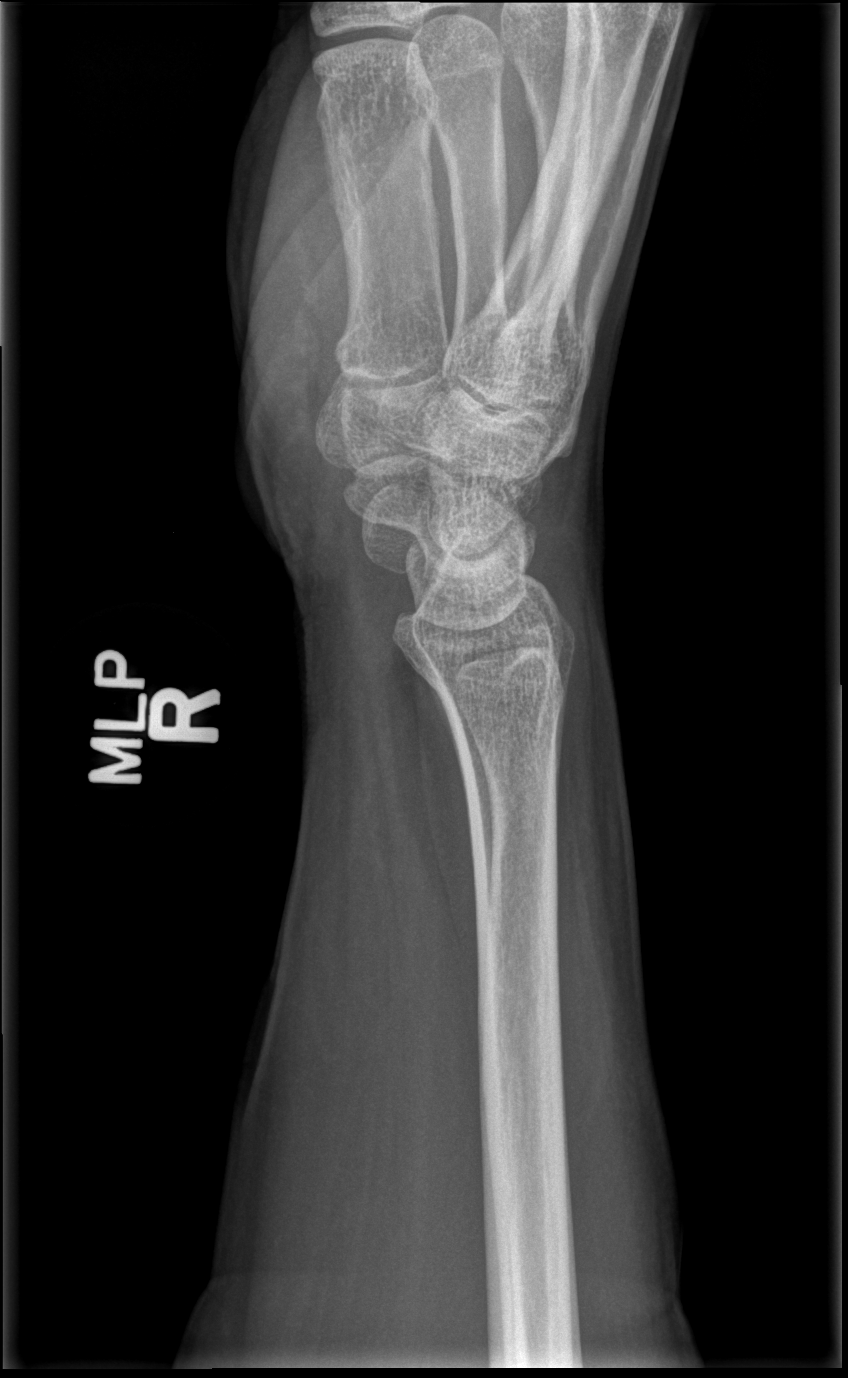

[4 of 4 positions shown; findings below may reference images not displayed]

FINDINGS: There is no evidence of fracture or dislocation. There is no
evidence of arthropathy or other focal bone abnormality. Soft
tissues are unremarkable.
IMPRESSION: Negative.

## 2015-04-12 IMAGING — CR DG KNEE COMPLETE 4+V*R*
4 series · 4 of 4 positions shown · non-contrast
Comparison: None.

CLINICAL DATA: Status post fall last night with right knee pain

EXAM:
RIGHT KNEE - COMPLETE 4+ VIEW

[t knee ap right]
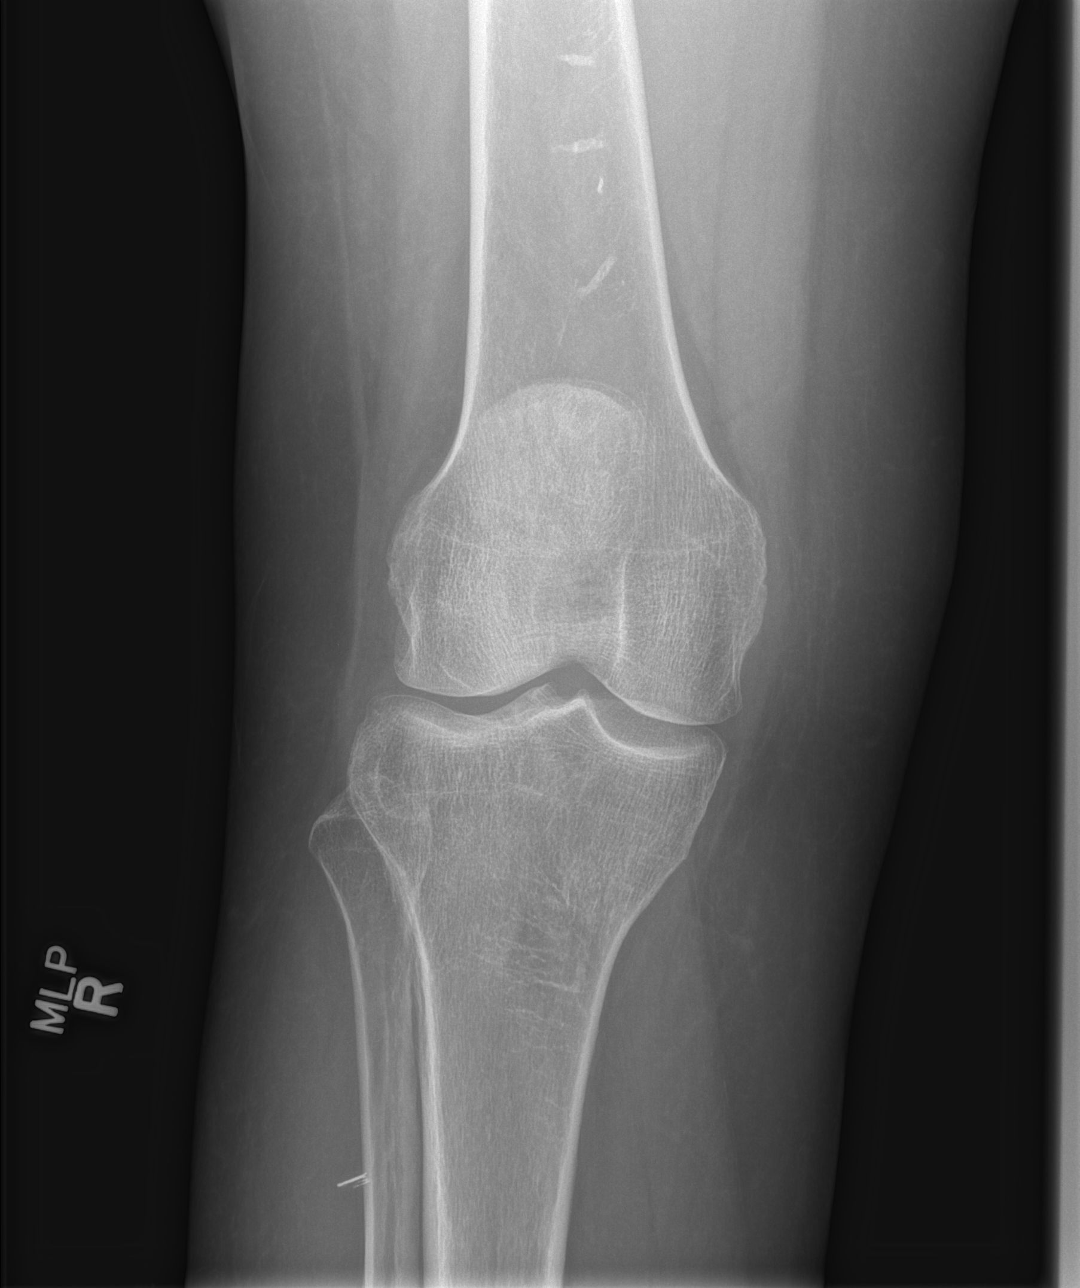

[t knee obl right (1 of 2)]
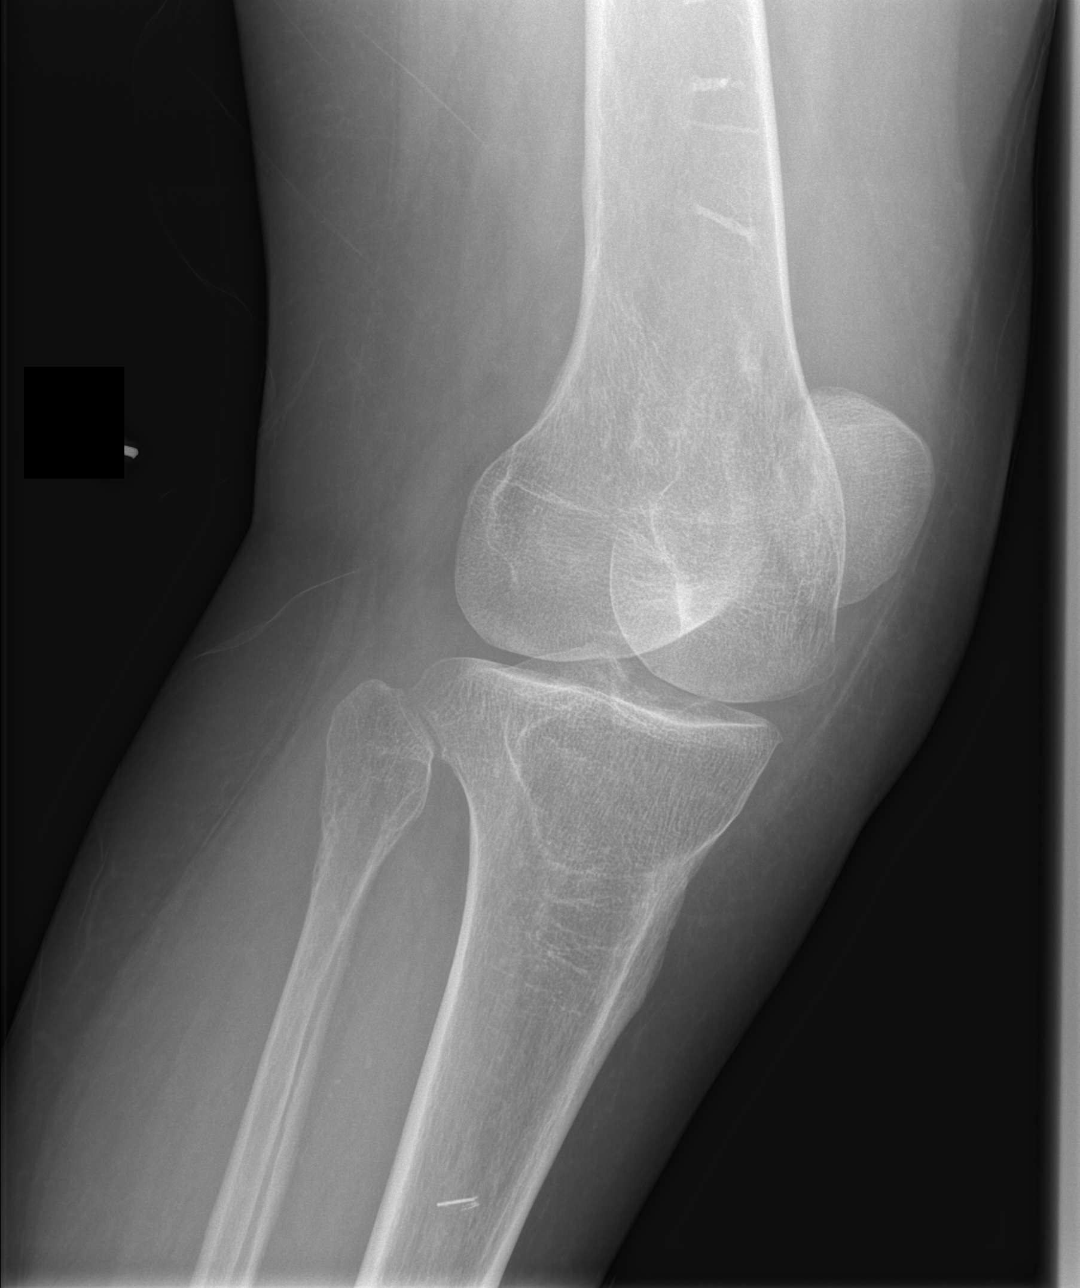

[t knee obl right (2 of 2)]
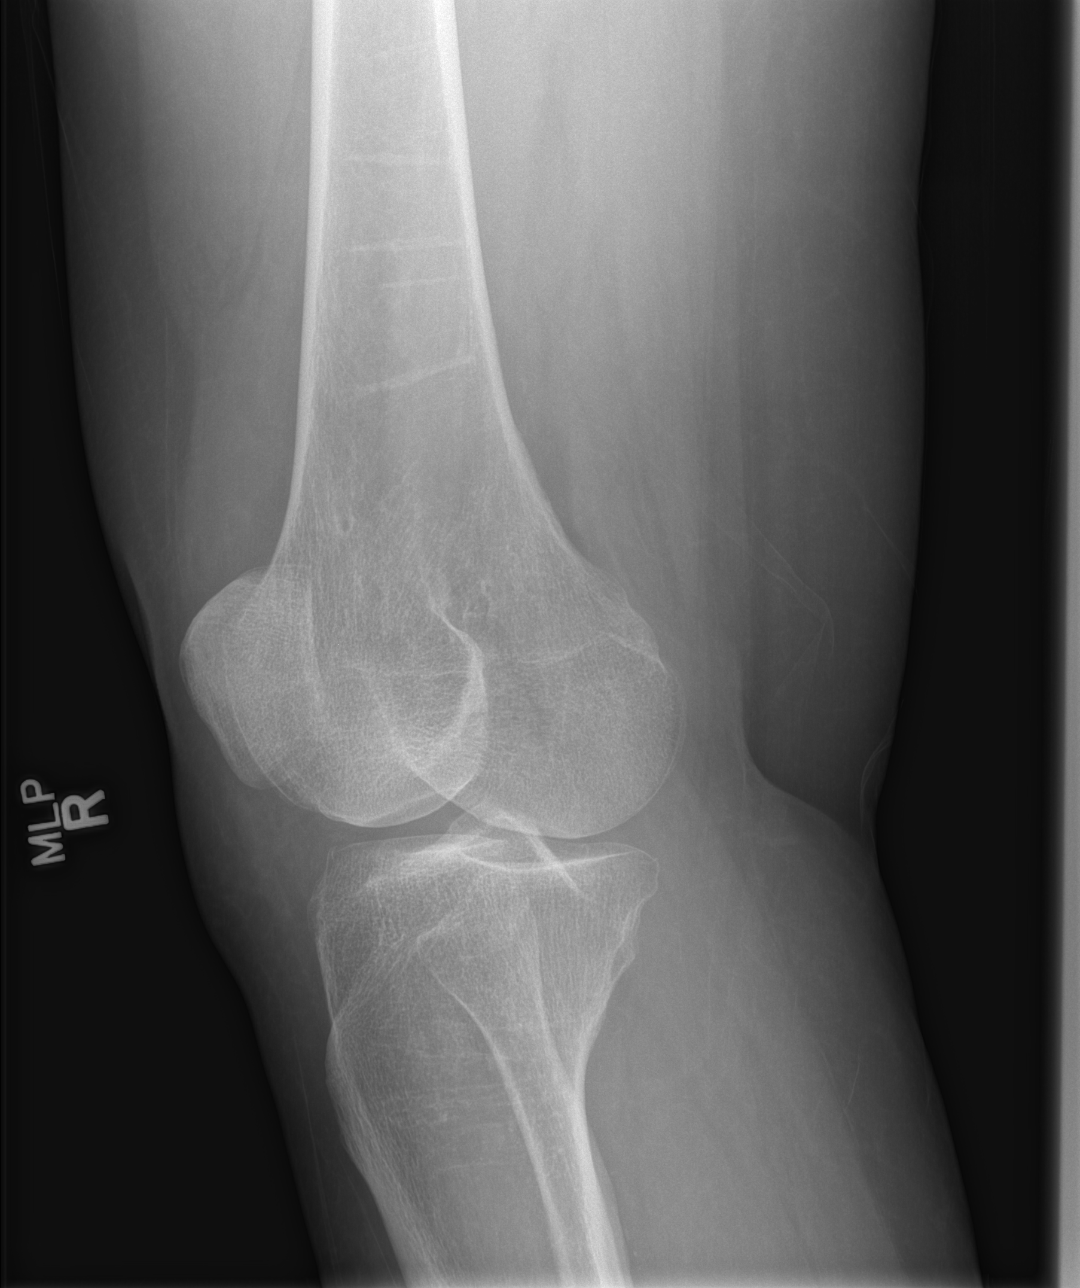

[t knee lat right]
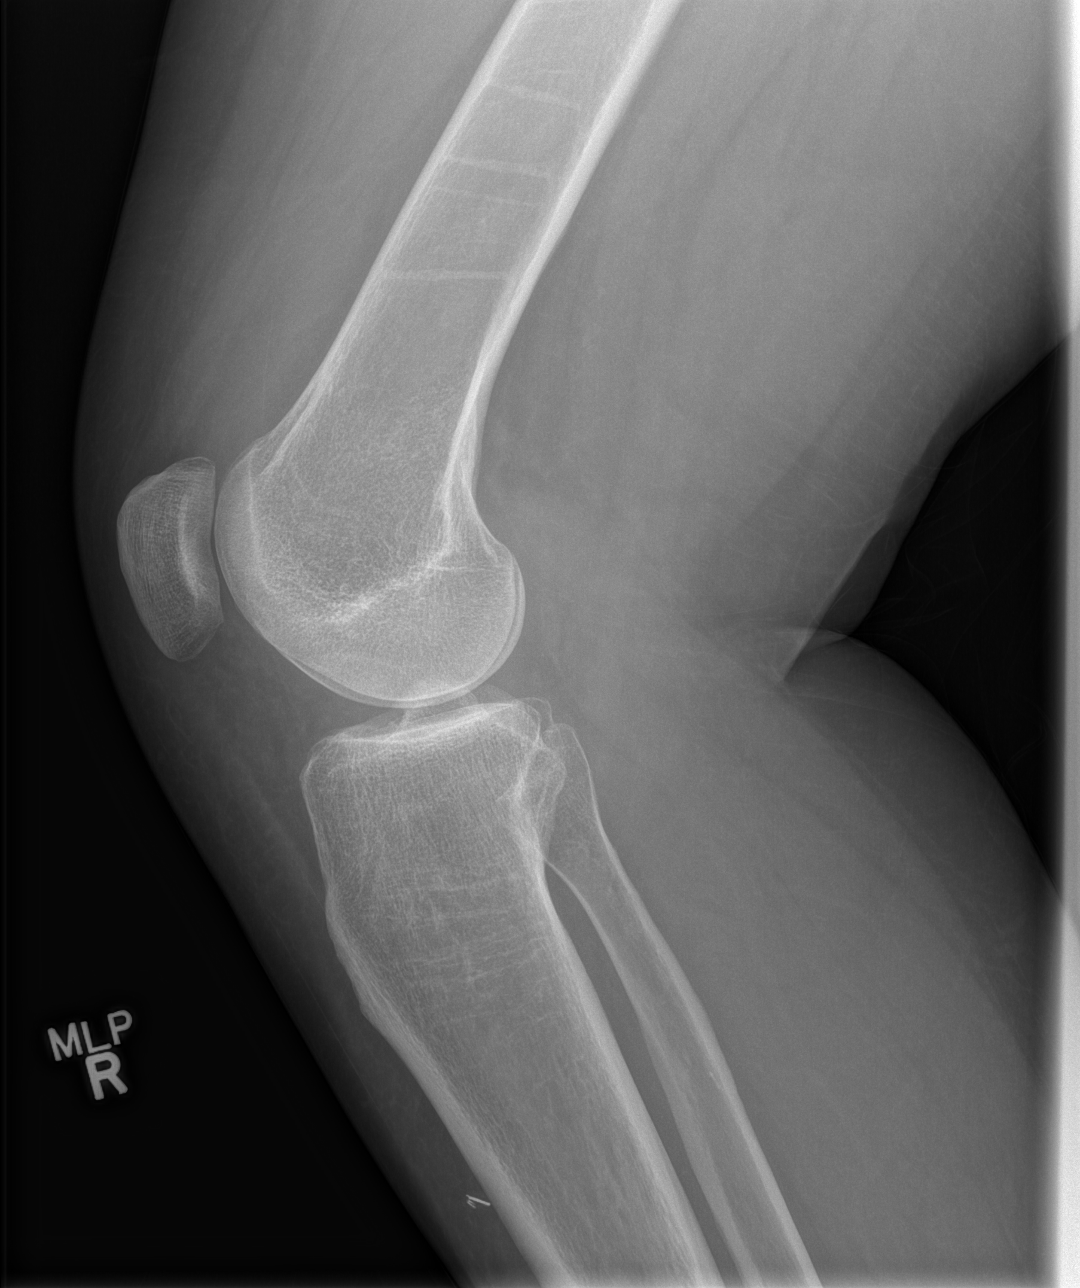

[4 of 4 positions shown; findings below may reference images not displayed]

FINDINGS: There is no evidence of fracture, dislocation. There is a small
suprapatellar effusion. There is no evidence of arthropathy or other
focal bone abnormality. Soft tissues are unremarkable.
IMPRESSION: No acute fracture or dislocation.

## 2015-04-12 MED ORDER — NAPROXEN 500 MG PO TABS: 500.0000 mg | ORAL_TABLET | Freq: Two times a day (BID) | ORAL | Status: DC

## 2015-04-12 NOTE — Discharge Instructions (Signed)
X-rays from the fall without evidence of any bony injuries. Take the Naprosyn as needed for discomfort. Would expect you to be sore and stiff for 2 days. Return for any new or worse symptoms. Work note provided.

## 2015-04-12 NOTE — ED Notes (Signed)
She states she tripped and fell on the sidewalk at a local convenience store yesterday.  She c/o right hand/wrist and bilat. Knee discomfort.  She is in no distress.

## 2015-04-12 NOTE — ED Provider Notes (Signed)
CSN: [id number]     Arrival date & time 04/12/15  0708 History   First MD Initiated Contact with Patient 04/12/15 0719     Chief Complaint  Patient presents with  . Fall     (Consider location/radiation/quality/duration/timing/severity/associated sxs/prior Treatment) Patient is a 54 y.o. female presenting with fall. The history is provided by the patient.  Fall Pertinent negatives include no chest pain, no abdominal pain, no headaches and no shortness of breath.   patient status post fall last evening around 7 PM tripped and fell on the sidewalk at a local convenience store. Did not hit her head no loss of consciousness. No syncope. Patient today with complaint of bilateral wrist pain right hand pain left elbow pain and bilateral knee pain. No hip pain some mild low back pain. No abdominal pain no feeling of shortness of breath. No abrasions or wounds.  Past Medical History  Diagnosis Date  . Eczema   . Thyroid disease   . Hypothyroid    Past Surgical History  Procedure Laterality Date  . Abdominal hysterectomy     Family History  Problem Relation Age of Onset  . Hypertension Mother   . Hypertension Sister   . Hypertension Brother    Social History  Substance Use Topics  . Smoking status: Current Every Day Smoker -- 1.00 packs/day    Types: Cigarettes  . Smokeless tobacco: Never Used  . Alcohol Use: No   OB History    No data available     Review of Systems  Constitutional: Negative for fever.  HENT: Negative for congestion.   Eyes: Negative for redness and visual disturbance.  Respiratory: Negative for shortness of breath.   Cardiovascular: Negative for chest pain.  Gastrointestinal: Negative for abdominal pain.  Genitourinary: Negative for dysuria.  Musculoskeletal: Positive for back pain. Negative for neck pain.  Skin: Negative for wound.  Neurological: Negative for headaches.  Psychiatric/Behavioral: Negative for confusion.      Allergies  Dilaudid and  Tramadol  Home Medications   Prior to Admission medications   Medication Sig Start Date End Date Taking? Authorizing Provider  diphenhydramine-acetaminophen (TYLENOL PM) 25-500 MG TABS Take 0.5-1 tablets by mouth at bedtime as needed. Pain/sleep   Yes Historical Provider, MD  ibuprofen (ADVIL,MOTRIN) 200 MG tablet Take 800 mg by mouth every 6 (six) hours as needed. pain   Yes Historical Provider, MD  levothyroxine (SYNTHROID, LEVOTHROID) 75 MCG tablet Take 75 mcg by mouth daily before breakfast.   Yes Historical Provider, MD  LORazepam (ATIVAN) 1 MG tablet Take 1 tablet (1 mg total) by mouth every 8 (eight) hours as needed for anxiety. No driving when taking. 03/30/14  Yes Cathren Laine, MD  Multiple Vitamins-Minerals (MULTIVITAMIN ADULTS 50+ PO) Take 1 tablet by mouth daily.   Yes Historical Provider, MD  methocarbamol (ROBAXIN) 500 MG tablet Take 1 tablet (500 mg total) by mouth every 8 (eight) hours as needed for muscle spasms. Patient not taking: Reported on 03/30/2014 11/15/13   Mercedes Camprubi-Soms, PA-C  naproxen (NAPROSYN) 500 MG tablet Take 1 tablet (500 mg total) by mouth 2 (two) times daily. 04/12/15   Vanetta Mulders, MD   BP 119/83 mmHg  Pulse 79  Temp(Src) 97.7 F (36.5 C) (Oral)  Resp 18  SpO2 96% Physical Exam  Constitutional: She is oriented to person, place, and time. She appears well-developed and well-nourished. No distress.  HENT:  Head: Normocephalic and atraumatic.  Mouth/Throat: Oropharynx is clear and moist.  Eyes: Conjunctivae and EOM  are normal. Pupils are equal, round, and reactive to light.  Neck: Normal range of motion. Neck supple.  Cardiovascular: Normal rate, regular rhythm and normal heart sounds.   No murmur heard. Pulmonary/Chest: Effort normal and breath sounds normal. No respiratory distress.  Abdominal: Soft. Bowel sounds are normal. There is no tenderness.  Musculoskeletal: Normal range of motion. She exhibits tenderness.  No abrasions. No  tenderness to palpation of the lumbar spine area. Mild tenderness to palpation of both wrists no snuffbox tenderness. Mild tenderness to palpation to the right hand laterally. Mild tenderness to palpation to both knees. No effusion good range of motion of all joints. No hip tenderness. No neck tenderness. Distally sensation is intact radial pulses are 2+ dorsalis pedis pulses are 2+. Good cap refill.  Neurological: She is alert and oriented to person, place, and time. No cranial nerve deficit. She exhibits normal muscle tone. Coordination normal.  Skin: Skin is warm.  Nursing note and vitals reviewed.   ED Course  Procedures (including critical care time) Labs Review Labs Reviewed - No data to display  Imaging Review Dg Elbow Complete Left  04/12/2015  CLINICAL DATA:  Status post fall last night with left elbow pain EXAM: LEFT ELBOW - COMPLETE 3+ VIEW COMPARISON:  None. FINDINGS: There is no evidence of fracture, dislocation, or joint effusion. There is no evidence of arthropathy or other focal bone abnormality. Soft tissues are unremarkable. IMPRESSION: Negative. Electronically Signed   By: Sherian ReinWei-Chen  Lin M.D.   On: 04/12/2015 08:29   Dg Wrist Complete Left  04/12/2015  CLINICAL DATA:  Status post fall last night with left wrist pain EXAM: LEFT WRIST - COMPLETE 3+ VIEW COMPARISON:  None. FINDINGS: There is no evidence of fracture or dislocation. There is no evidence of arthropathy or other focal bone abnormality. Soft tissues are unremarkable. IMPRESSION: Negative. Electronically Signed   By: Sherian ReinWei-Chen  Lin M.D.   On: 04/12/2015 08:27   Dg Wrist Complete Right  04/12/2015  CLINICAL DATA:  Status post fall last night with right wrist pain EXAM: RIGHT WRIST - COMPLETE 3+ VIEW COMPARISON:  None. FINDINGS: There is no evidence of fracture or dislocation. There is no evidence of arthropathy or other focal bone abnormality. Soft tissues are unremarkable. IMPRESSION: Negative. Electronically Signed    By: Sherian ReinWei-Chen  Lin M.D.   On: 04/12/2015 08:28   Dg Knee Complete 4 Views Left  04/12/2015  CLINICAL DATA:  Status post fall last night with left knee pain EXAM: LEFT KNEE - COMPLETE 4+ VIEW COMPARISON:  None. FINDINGS: There is no evidence of fracture, dislocation, or joint effusion. There is no evidence of arthropathy or other focal bone abnormality. Soft tissues are unremarkable. IMPRESSION: Negative. Electronically Signed   By: Sherian ReinWei-Chen  Lin M.D.   On: 04/12/2015 08:30   Dg Knee Complete 4 Views Right  04/12/2015  CLINICAL DATA:  Status post fall last night with right knee pain EXAM: RIGHT KNEE - COMPLETE 4+ VIEW COMPARISON:  None. FINDINGS: There is no evidence of fracture, dislocation. There is a small suprapatellar effusion. There is no evidence of arthropathy or other focal bone abnormality. Soft tissues are unremarkable. IMPRESSION: No acute fracture or dislocation. Electronically Signed   By: Sherian ReinWei-Chen  Lin M.D.   On: 04/12/2015 08:30   Dg Hand Complete Right  04/12/2015  CLINICAL DATA:  Status post fall last night with right hand pain. EXAM: RIGHT HAND - COMPLETE 3+ VIEW COMPARISON:  None. FINDINGS: There is no evidence of fracture or dislocation.  There is no evidence of arthropathy or other focal bone abnormality. Soft tissues are unremarkable. IMPRESSION: Negative. Electronically Signed   By: Sherian Rein M.D.   On: 04/12/2015 08:28   I have personally reviewed and evaluated these images and lab results as part of my medical decision-making.   EKG Interpretation None      MDM   Final diagnoses:  Fall, initial encounter  Knee contusion, left, initial encounter  Knee contusion, right, initial encounter  Pain of right hand  Pain in both wrists    Patient with a fall yesterday at a convenience store. Complaint of bilateral wrist pain no snuffbox tenderness. Right hand pain left elbow pain and bilateral knee pain. Denies any hip pain. Did not hit her head no headache no neck  pain. Does mention some mild low back pain but no significant tenderness on exam. X-rays without any acute findings for any bony injuries. Patient will be treated with anti-inflammatory medicine and a work note.    Vanetta Mulders, MD 04/12/15 [phone]

## 2015-04-16 ENCOUNTER — Encounter (HOSPITAL_COMMUNITY): Payer: Self-pay | Admitting: *Deleted

## 2015-04-16 ENCOUNTER — Emergency Department (HOSPITAL_COMMUNITY)
Admission: EM | Admit: 2015-04-16 | Discharge: 2015-04-17 | Disposition: A | Discharge status: Home / Self Care | Payer: PRIVATE HEALTH INSURANCE | Attending: Emergency Medicine | Admitting: Emergency Medicine

## 2015-04-16 DIAGNOSIS — E039 Hypothyroidism, unspecified: Secondary | ICD-10-CM | POA: Insufficient documentation

## 2015-04-16 DIAGNOSIS — Z79899 Other long term (current) drug therapy: Secondary | ICD-10-CM | POA: Insufficient documentation

## 2015-04-16 DIAGNOSIS — J069 Acute upper respiratory infection, unspecified: Secondary | ICD-10-CM

## 2015-04-16 DIAGNOSIS — F17210 Nicotine dependence, cigarettes, uncomplicated: Secondary | ICD-10-CM | POA: Insufficient documentation

## 2015-04-16 DIAGNOSIS — Z872 Personal history of diseases of the skin and subcutaneous tissue: Secondary | ICD-10-CM | POA: Insufficient documentation

## 2015-04-16 NOTE — ED Notes (Signed)
Pt states that she has had cough and congestion for a couple of days; pt states that she began to feel dizzy today; pt states that she feels dizzy when she moves around or leans over; pt denies LOC; pt states that she has just not felt well all day; pt c/o lower back pain as well; pt c/o diarrhea x 1 after eating

## 2015-04-17 ENCOUNTER — Emergency Department (HOSPITAL_COMMUNITY): Payer: PRIVATE HEALTH INSURANCE

## 2015-04-17 LAB — I-STAT CHEM 8, ED
BUN: 11 mg/dL (ref 6–20)
CALCIUM ION: 1.13 mmol/L (ref 1.12–1.23)
Chloride: 107 mmol/L (ref 101–111)
Creatinine, Ser: 0.8 mg/dL (ref 0.44–1.00)
Glucose, Bld: 92 mg/dL (ref 65–99)
HEMATOCRIT: 49 % — AB (ref 36.0–46.0)
HEMOGLOBIN: 16.7 g/dL — AB (ref 12.0–15.0)
Potassium: 3.6 mmol/L (ref 3.5–5.1)
SODIUM: 142 mmol/L (ref 135–145)
TCO2: 25 mmol/L (ref 0–100)

## 2015-04-17 LAB — I-STAT TROPONIN, ED: TROPONIN I, POC: 0 ng/mL (ref 0.00–0.08)

## 2015-04-17 IMAGING — CR DG CHEST 2V
2 series · 2 of 2 positions shown · non-contrast
Comparison: Chest radiograph dated [DATE]

CLINICAL DATA: 54-year-old female with cough

EXAM:
CHEST  2 VIEW

[w chest pa]
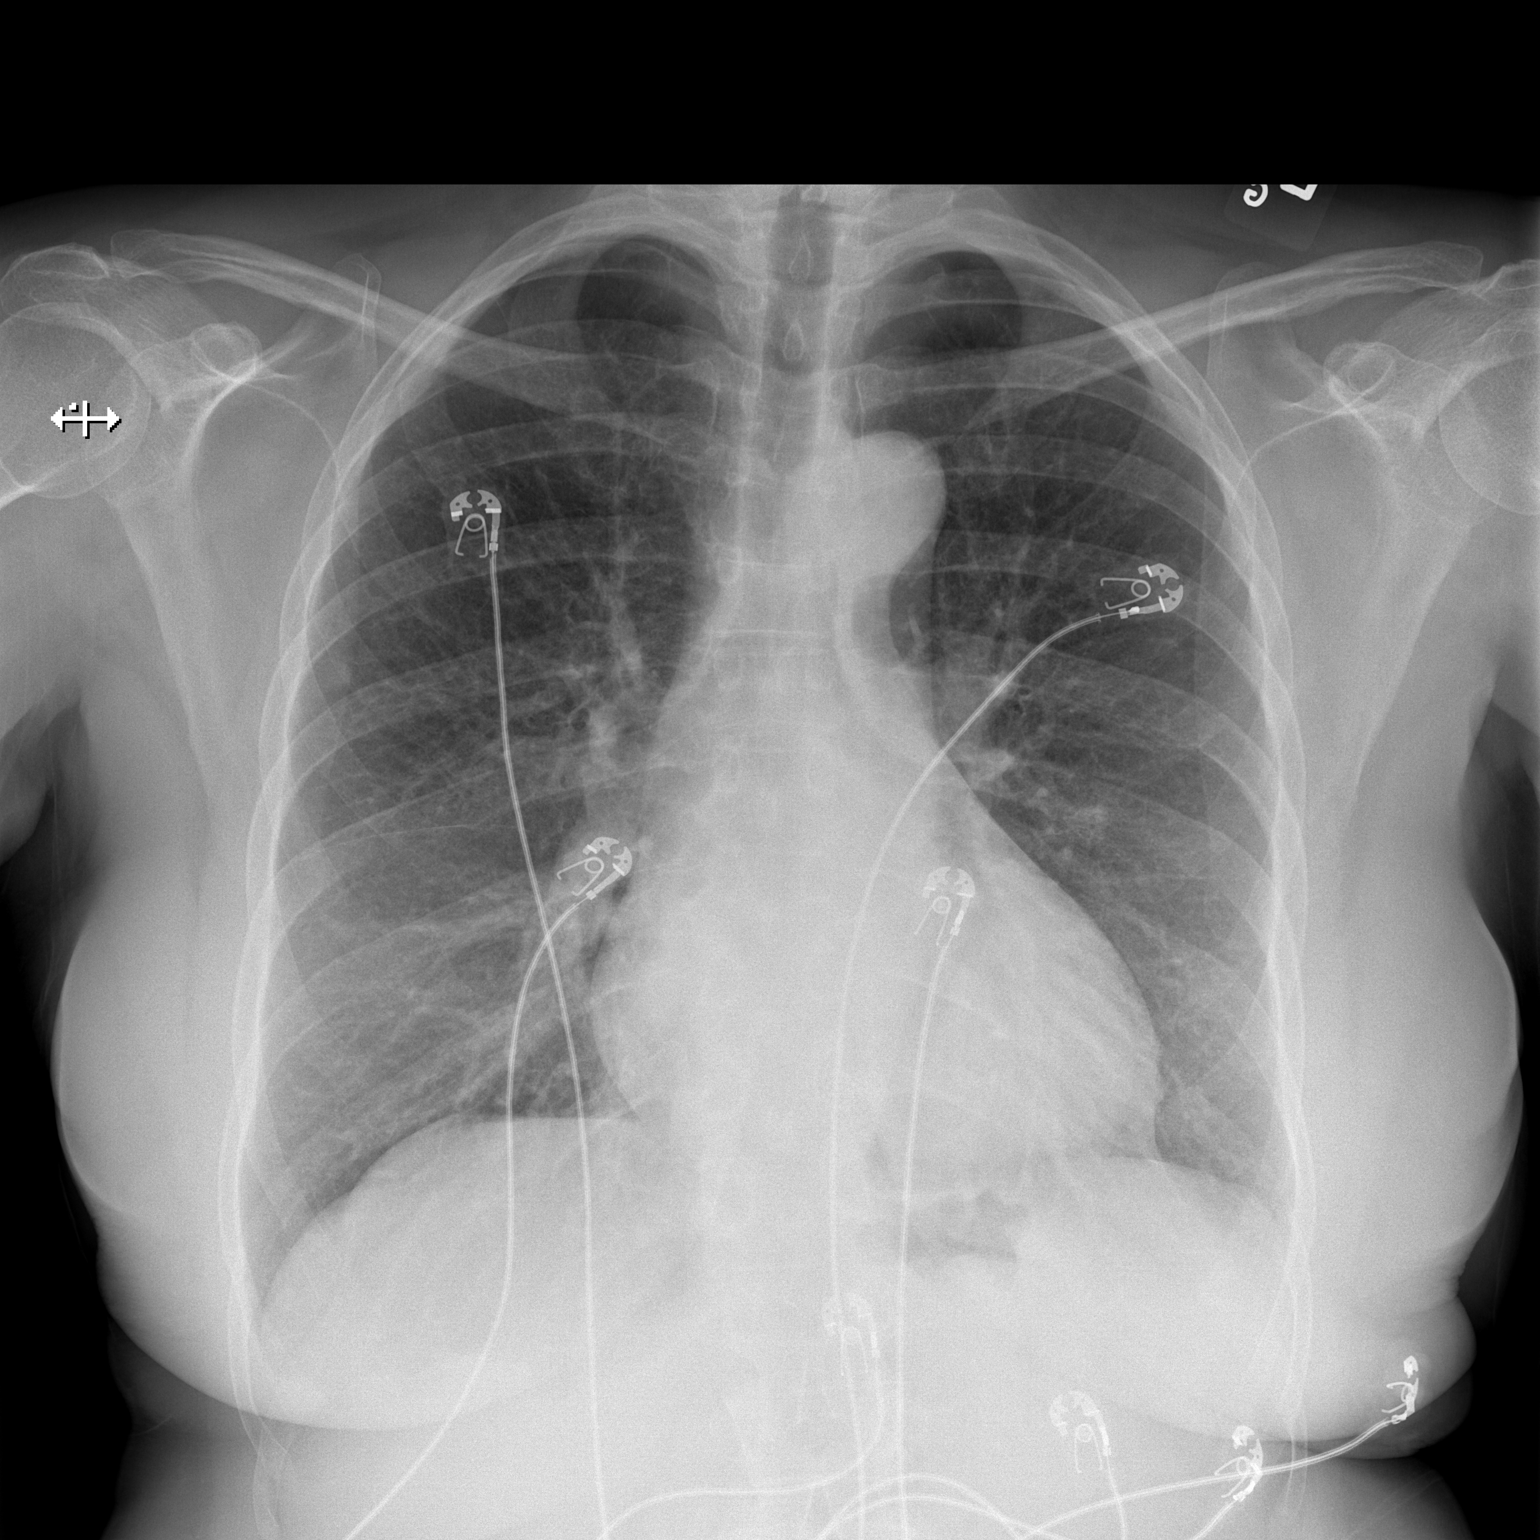

[w chest lat]
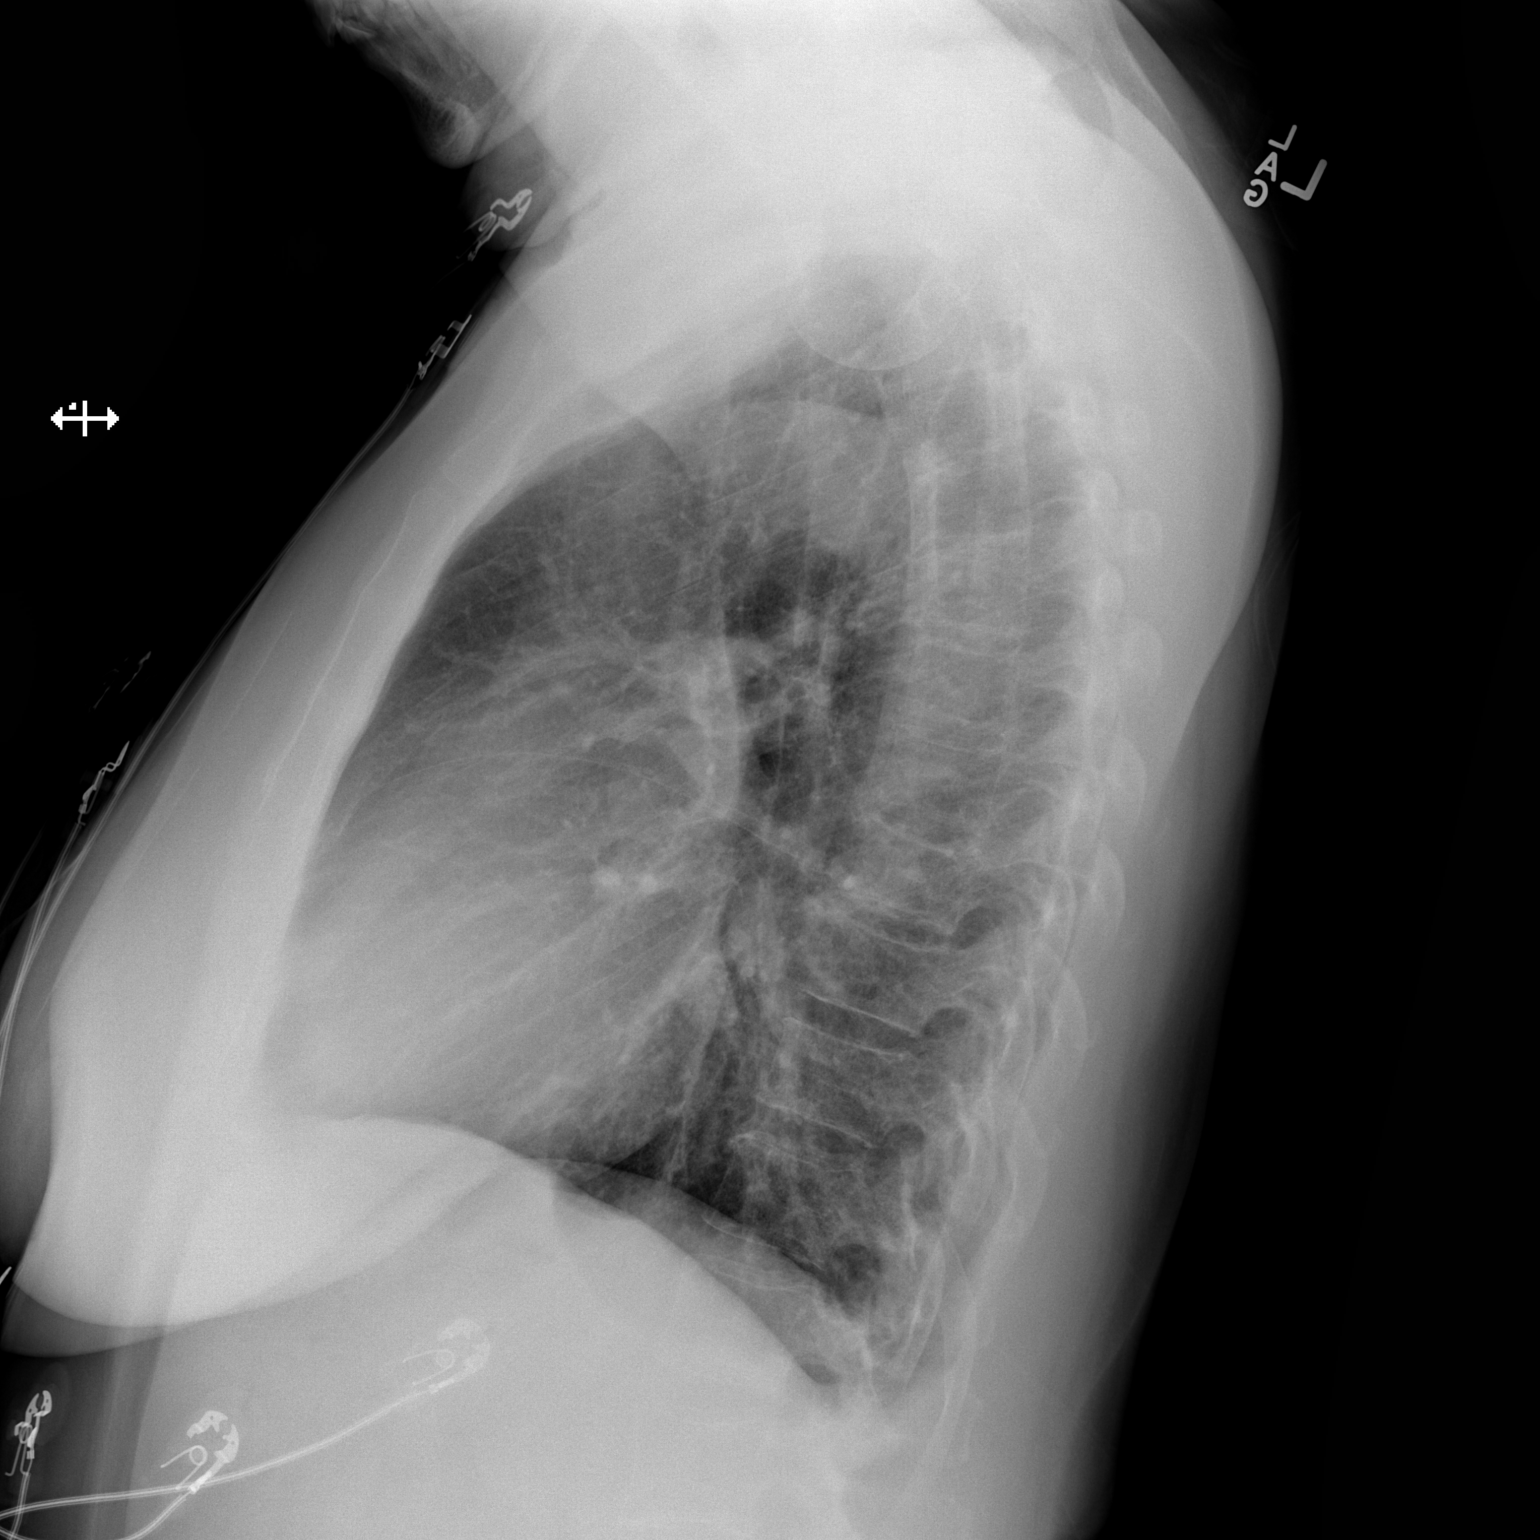

[2 of 2 positions shown; findings below may reference images not displayed]

FINDINGS: The heart size and mediastinal contours are within normal limits.
Both lungs are clear. The visualized skeletal structures are
unremarkable.
IMPRESSION: No active cardiopulmonary disease.

## 2015-04-17 NOTE — ED Provider Notes (Signed)
CSN: [id number]     Arrival date & time 04/16/15  2341 History   First MD Initiated Contact with Patient 04/17/15 0037     Chief Complaint  Patient presents with  . Dizziness     (Consider location/radiation/quality/duration/timing/severity/associated sxs/prior Treatment) HPI   Blood pressure 128/93, pulse 104, temperature 97.8 F (36.6 C), temperature source Oral, resp. rate 20, SpO2 97 %.  Jessica Cooke is a 54 y.o. female with past medical history significant for hypothyroid complaining of nasal congestion, dizziness (denies vertigo) productive cough, thoracic back pain described as pleuritic feeling clammy and single episode of nonbloody, non-melanotic diarrhea onset today. Patient denies chest pain, shortness of breath, fever, chills, abdominal pain, nausea, vomiting, change in urination. States that she feels lightheaded even when sitting down. Not exacerbated by head movement or standing. States she is eating and drinking normally with normal urine output, denies dysuria, hematuria, urinary frequency, change in bowel or bladder habits.  Past Medical History  Diagnosis Date  . Eczema   . Thyroid disease   . Hypothyroid    Past Surgical History  Procedure Laterality Date  . Abdominal hysterectomy     Family History  Problem Relation Age of Onset  . Hypertension Mother   . Hypertension Sister   . Hypertension Brother    Social History  Substance Use Topics  . Smoking status: Current Every Day Smoker -- 1.00 packs/day    Types: Cigarettes  . Smokeless tobacco: Never Used  . Alcohol Use: Yes     Comment: rarely   OB History    No data available     Review of Systems  10 systems reviewed and found to be negative, except as noted in the HPI.   Allergies  Dilaudid and Tramadol  Home Medications   Prior to Admission medications   Medication Sig Start Date End Date Taking? Authorizing Provider  guaiFENesin (MUCINEX) 600 MG 12 hr tablet Take 600 mg by mouth 2  (two) times daily.   Yes Historical Provider, MD  ibuprofen (ADVIL,MOTRIN) 200 MG tablet Take 800 mg by mouth every 6 (six) hours as needed. pain   Yes Historical Provider, MD  levothyroxine (SYNTHROID, LEVOTHROID) 75 MCG tablet Take 75 mcg by mouth daily before breakfast.   Yes Historical Provider, MD  Pseudoeph-Doxylamine-DM-APAP (NYQUIL PO) Take 30 mLs by mouth at bedtime.   Yes Historical Provider, MD  LORazepam (ATIVAN) 1 MG tablet Take 1 tablet (1 mg total) by mouth every 8 (eight) hours as needed for anxiety. No driving when taking. Patient not taking: Reported on 04/16/2015 03/30/14   Cathren Laine, MD  methocarbamol (ROBAXIN) 500 MG tablet Take 1 tablet (500 mg total) by mouth every 8 (eight) hours as needed for muscle spasms. Patient not taking: Reported on 03/30/2014 11/15/13   Mercedes Camprubi-Soms, PA-C  naproxen (NAPROSYN) 500 MG tablet Take 1 tablet (500 mg total) by mouth 2 (two) times daily. Patient not taking: Reported on 04/16/2015 04/12/15   Vanetta Mulders, MD   BP 128/93 mmHg  Pulse 104  Temp(Src) 97.8 F (36.6 C) (Oral)  Resp 20  SpO2 97% Physical Exam  Constitutional: She is oriented to person, place, and time. She appears well-developed and well-nourished. No distress.  HENT:  Head: Normocephalic and atraumatic.  Mouth/Throat: Oropharynx is clear and moist.  No drooling or stridor. Posterior pharynx mildly erythematous no significant tonsillar hypertrophy. No exudate. Soft palate rises symmetrically. No TTP or induration under tongue.   No tenderness to palpation of frontal or bilateral  maxillary sinuses.  No mucosal edema in the nares.  Bilateral tympanic membranes with normal architecture and good light reflex.   Eyes: Conjunctivae and EOM are normal. Pupils are equal, round, and reactive to light.  Neck: Normal range of motion.  Cardiovascular: Normal rate, regular rhythm and intact distal pulses.   Pulmonary/Chest: Effort normal and breath sounds normal.  No stridor.  Abdominal: Soft. There is no tenderness.  Musculoskeletal: Normal range of motion.  Neurological: She is alert and oriented to person, place, and time.  Follows commands, Clear, goal oriented speech, Strength is 5 out of 5x4 extremities, patient ambulates with a coordinated in nonantalgic gait. Sensation is grossly intact.   Skin: She is not diaphoretic.  Psychiatric: She has a normal mood and affect.  Nursing note and vitals reviewed.   ED Course  Procedures (including critical care time) Labs Review Labs Reviewed  I-STAT CHEM 8, ED - Abnormal; Notable for the following:    Hemoglobin 16.7 (*)    HCT 49.0 (*)    All other components within normal limits  URINALYSIS, ROUTINE W REFLEX MICROSCOPIC (NOT [facility])    Imaging Review No results found. I have personally reviewed and evaluated these images and lab results as part of my medical decision-making.   EKG Interpretation None      MDM   Final diagnoses:  URI (upper respiratory infection)    Filed Vitals:   04/16/15 2347 04/17/15 0139  BP: 128/93 140/77  Pulse: 104 71  Temp: 97.8 F (36.6 C)   TempSrc: Oral   Resp: 20 18  SpO2: 97% 97%     Jessica Jobslberta Bogen is 54 y.o. female presenting with sinus congestion, cough and lightheaded sensation. Physical exam and vital signs reassuring, initially she had a mild tachycardia which has spontaneously resolved, patient is not orthostatic by vital signs or symptoms. EKG unchanged, chemistry and troponin negative. Chest x-rays without infiltrate.  Evaluation does not show pathology that would require ongoing emergent intervention or inpatient treatment. Pt is hemodynamically stable and mentating appropriately. Discussed findings and plan with patient/guardian, who agrees with care plan. All questions answered. Return precautions discussed and outpatient follow up given.      Jessica Emeryicole Cereniti Curb, PA-C 04/17/15 [id number]  Zadie Rhineonald Wickline, MD 04/17/15 [phone]

## 2015-04-17 NOTE — Discharge Instructions (Signed)
Use nasal saline (you can try [employer] Simply Saline) at least 4 times a day, use saline 5-10 minutes before using the fluticasone (flonase) nasal spray ° °Do not use Afrin (Oxymetazoline) ° °Rest, wash hands frequently  and drink plenty of water. ° °You may try counter medication such as Mucinex or Sudafed decongestant. ° °Please follow with your primary care doctor in the next 2 days for a check-up. They must obtain records for further management.  ° °Do not hesitate to return to the Emergency Department for any new, worsening or concerning symptoms.  ° ° °Upper Respiratory Infection, Adult °Most upper respiratory infections (URIs) are a viral infection of the air passages leading to the lungs. A URI affects the nose, throat, and upper air passages. The most common type of URI is nasopharyngitis and is typically referred to as "the common cold." °URIs run their course and usually go away on their own. Most of the time, a URI does not require medical attention, but sometimes a bacterial infection in the upper airways can follow a viral infection. This is called a secondary infection. Sinus and middle ear infections are common types of secondary upper respiratory infections. °Bacterial pneumonia can also complicate a URI. A URI can worsen asthma and chronic obstructive pulmonary disease (COPD). Sometimes, these complications can require emergency medical care and may be life threatening.  °CAUSES °Almost all URIs are caused by viruses. A virus is a type of germ and can spread from one person to another.  °RISKS FACTORS °You may be at risk for a URI if:  °· You smoke.   °· You have chronic heart or lung disease. °· You have a weakened defense (immune) system.   °· You are very young or very old.   °· You have nasal allergies or asthma. °· You work in crowded or poorly ventilated areas. °· You work in health care facilities or schools. °SIGNS AND SYMPTOMS  °Symptoms typically develop 2-3 days after you come in  contact with a cold virus. Most viral URIs last 7-10 days. However, viral URIs from the influenza virus (flu virus) can last 14-18 days and are typically more severe. Symptoms may include:  °· Runny or stuffy (congested) nose.   °· Sneezing.   °· Cough.   °· Sore throat.   °· Headache.   °· Fatigue.   °· Fever.   °· Loss of appetite.   °· Pain in your forehead, behind your eyes, and over your cheekbones (sinus pain). °· Muscle aches.   °DIAGNOSIS  °Your health care provider may diagnose a URI by: °· Physical exam. °· Tests to check that your symptoms are not due to another condition such as: °¨ Strep throat. °¨ Sinusitis. °¨ Pneumonia. °¨ Asthma. °TREATMENT  °A URI goes away on its own with time. It cannot be cured with medicines, but medicines may be prescribed or recommended to relieve symptoms. Medicines may help: °· Reduce your fever. °· Reduce your cough. °· Relieve nasal congestion. °HOME CARE INSTRUCTIONS  °· Take medicines only as directed by your health care provider.   °· Gargle warm saltwater or take cough drops to comfort your throat as directed by your health care provider. °· Use a warm mist humidifier or inhale steam from a shower to increase air moisture. This may make it easier to breathe. °· Drink enough fluid to keep your urine clear or pale yellow.   °· Eat soups and other clear broths and maintain good nutrition.   °· Rest as needed.   °· Return to work when your temperature has returned   to normal or as your health care provider advises. You may need to stay home longer to avoid infecting others. You can also use a face mask and careful hand washing to prevent spread of the virus. °· Increase the usage of your inhaler if you have asthma.   °· Do not use any tobacco products, including cigarettes, chewing tobacco, or electronic cigarettes. If you need help quitting, ask your health care provider. °PREVENTION  °The best way to protect yourself from getting a cold is to practice good hygiene.   °· Avoid oral or hand contact with people with cold symptoms.   °· Wash your hands often if contact occurs.   °There is no clear evidence that vitamin C, vitamin E, echinacea, or exercise reduces the chance of developing a cold. However, it is always recommended to get plenty of rest, exercise, and practice good nutrition.  °SEEK MEDICAL CARE IF:  °· You are getting worse rather than better.   °· Your symptoms are not controlled by medicine.   °· You have chills. °· You have worsening shortness of breath. °· You have brown or red mucus. °· You have yellow or brown nasal discharge. °· You have pain in your face, especially when you bend forward. °· You have a fever. °· You have swollen neck glands. °· You have pain while swallowing. °· You have white areas in the back of your throat. °SEEK IMMEDIATE MEDICAL CARE IF:  °· You have severe or persistent: °¨ Headache. °¨ Ear pain. °¨ Sinus pain. °¨ Chest pain. °· You have chronic lung disease and any of the following: °¨ Wheezing. °¨ Prolonged cough. °¨ Coughing up blood. °¨ A change in your usual mucus. °· You have a stiff neck. °· You have changes in your: °¨ Vision. °¨ Hearing. °¨ Thinking. °¨ Mood. °MAKE SURE YOU:  °· Understand these instructions. °· Will watch your condition. °· Will get help right away if you are not doing well or get worse. °  °This information is not intended to replace advice given to you by your health care provider. Make sure you discuss any questions you have with your health care provider. °  °Document Released: 09/28/2000 Document Revised: 08/19/2014 Document Reviewed: 07/10/2013 °Elsevier Interactive Patient Education ©2016 [employer] ° °

## 2015-06-07 ENCOUNTER — Emergency Department (HOSPITAL_COMMUNITY)
Admission: EM | Admit: 2015-06-07 | Discharge: 2015-06-07 | Disposition: A | Discharge status: Home / Self Care | Payer: Self-pay | Attending: Emergency Medicine | Admitting: Emergency Medicine

## 2015-06-07 ENCOUNTER — Emergency Department (HOSPITAL_COMMUNITY): Payer: Self-pay

## 2015-06-07 ENCOUNTER — Encounter (HOSPITAL_COMMUNITY): Payer: Self-pay | Admitting: *Deleted

## 2015-06-07 DIAGNOSIS — Z872 Personal history of diseases of the skin and subcutaneous tissue: Secondary | ICD-10-CM | POA: Insufficient documentation

## 2015-06-07 DIAGNOSIS — E039 Hypothyroidism, unspecified: Secondary | ICD-10-CM | POA: Insufficient documentation

## 2015-06-07 DIAGNOSIS — F17210 Nicotine dependence, cigarettes, uncomplicated: Secondary | ICD-10-CM | POA: Insufficient documentation

## 2015-06-07 DIAGNOSIS — Z79899 Other long term (current) drug therapy: Secondary | ICD-10-CM | POA: Insufficient documentation

## 2015-06-07 DIAGNOSIS — R519 Headache, unspecified: Secondary | ICD-10-CM

## 2015-06-07 DIAGNOSIS — R51 Headache: Secondary | ICD-10-CM | POA: Insufficient documentation

## 2015-06-07 DIAGNOSIS — Z791 Long term (current) use of non-steroidal anti-inflammatories (NSAID): Secondary | ICD-10-CM | POA: Insufficient documentation

## 2015-06-07 LAB — CBC WITH DIFFERENTIAL/PLATELET
BASOS ABS: 0 10*3/uL (ref 0.0–0.1)
BASOS PCT: 0 %
EOS ABS: 0.1 10*3/uL (ref 0.0–0.7)
Eosinophils Relative: 2 %
HEMATOCRIT: 40.8 % (ref 36.0–46.0)
HEMOGLOBIN: 13.7 g/dL (ref 12.0–15.0)
Lymphocytes Relative: 56 %
Lymphs Abs: 3.2 10*3/uL (ref 0.7–4.0)
MCH: 30.5 pg (ref 26.0–34.0)
MCHC: 33.6 g/dL (ref 30.0–36.0)
MCV: 90.9 fL (ref 78.0–100.0)
MONOS PCT: 4 %
Monocytes Absolute: 0.2 10*3/uL (ref 0.1–1.0)
NEUTROS ABS: 2.1 10*3/uL (ref 1.7–7.7)
Neutrophils Relative %: 38 %
Platelets: 361 10*3/uL (ref 150–400)
RBC: 4.49 MIL/uL (ref 3.87–5.11)
RDW: 13.6 % (ref 11.5–15.5)
WBC: 5.6 10*3/uL (ref 4.0–10.5)

## 2015-06-07 LAB — SEDIMENTATION RATE: SED RATE: 3 mm/h (ref 0–22)

## 2015-06-07 LAB — BASIC METABOLIC PANEL
ANION GAP: 11 (ref 5–15)
BUN: 6 mg/dL (ref 6–20)
CALCIUM: 9.5 mg/dL (ref 8.9–10.3)
CO2: 22 mmol/L (ref 22–32)
Chloride: 102 mmol/L (ref 101–111)
Creatinine, Ser: 0.82 mg/dL (ref 0.44–1.00)
GFR calc Af Amer: >60 mL/min (ref >60)
GFR calc non Af Amer: >60 mL/min (ref >60)
Glucose, Bld: 97 mg/dL (ref 65–99)
Potassium: 3.9 mmol/L (ref 3.5–5.1)
SODIUM: 135 mmol/L (ref 135–145)

## 2015-06-07 IMAGING — CT CT ANGIO HEAD
1 of 10 series · 1 of 33 positions shown · IV contrast (omnipaque)
Comparison: CT head [DATE]

CLINICAL DATA: Severe left-sided headache

EXAM:
CT ANGIOGRAPHY HEAD AND NECK
TECHNIQUE: Multidetector CT imaging of the head and neck was performed using
the standard protocol during bolus administration of intravenous
contrast. Multiplanar CT image reconstructions and MIPs were
obtained to evaluate the vascular anatomy. Carotid stenosis
measurements (when applicable) are obtained utilizing NASCET
criteria, using the distal internal carotid diameter as the
denominator.
CONTRAST:  50mL OMNIPAQUE IOHEXOL 350 MG/ML SOLN

[Series 200: locator · axial · 0.49mm/px · 1 of 1 slices shown]
[im 1/1  soft-tissue]
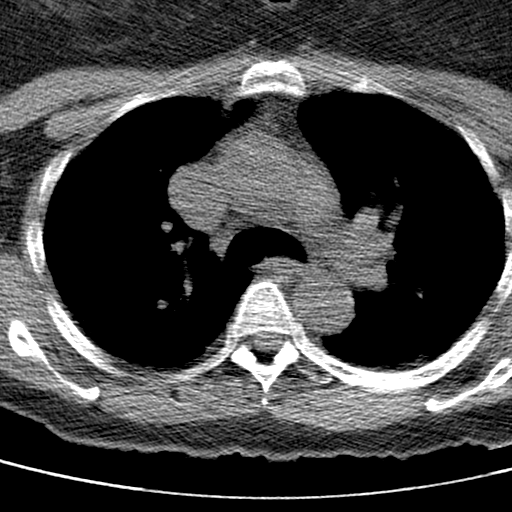

[1 of 33 positions shown; findings below may reference images not displayed]

FINDINGS: CTA NECK

Aortic arch: Normal aortic arch. Proximal great vessels widely
patent. Lung apices clear.

Right carotid system: Right common carotid artery widely patent.
Minimal atherosclerotic disease in the carotid bulb without carotid
stenosis or dissection

Left carotid system: Left common carotid artery widely patent. Left
carotid bifurcation normal without atherosclerotic disease or
dissection. No significant stenosis.

Vertebral arteries:Both vertebral arteries patent to the basilar
without stenosis or dissection.

Skeleton: Mild cervical spine degenerative change. Negative for
fracture or mass lesion.

Other neck: Negative for adenopathy in the neck.

CTA HEAD

Anterior circulation: Mild atherosclerotic calcification right
cavernous carotid without stenosis. Right anterior and right middle
cerebral arteries patent without stenosis

Left cavernous carotid widely patent with minimal atherosclerotic
calcification. Left anterior and middle cerebral arteries widely
patent without stenosis.

Posterior circulation: Both vertebral arteries patent to the
basilar. PICA patent bilaterally. Basilar widely patent. Superior
cerebellar and posterior cerebral arteries widely patent.

Venous sinuses: Negative

Anatomic variants: None

Delayed phase: Normal enhancement on delayed imaging.
IMPRESSION: Negative CTA of the head neck. No significant arterial stenosis.
Negative for vascular malformation.

## 2015-06-07 IMAGING — CT CT HEAD W/O CM
1 series · 16 of 30 positions shown, 20 images · non-contrast
Comparison: [DATE]

CLINICAL DATA: Pt c/o L sided headache that radiates to L base of
skull x 2 weeks. Denies blurred vision, nausea, limb numbness or
weakness. She thought it might be c/b her tooth, so she had her
tooth pulled. Pt can't bear to touch the L side of forehead with
hand b/c it hurts too much.

EXAM:
CT HEAD WITHOUT CONTRAST
TECHNIQUE: Contiguous axial images were obtained from the base of the skull
through the vertex without intravenous contrast.

[Series 2: head 5.0 h30s · axial · 0.46mm/px · z∈[-157,-7]mm · 16 of 34 slices shown, 20 images]
[im 2/34  brain]
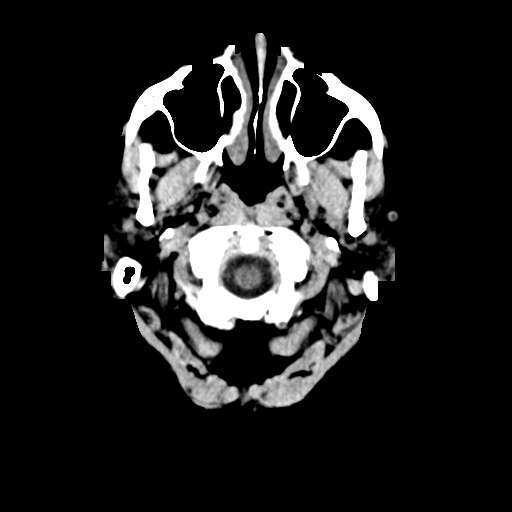
[im 2/34  bone]
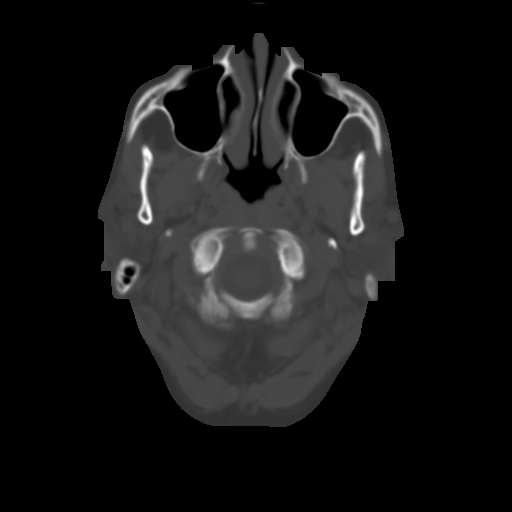
[im 4/34  brain]
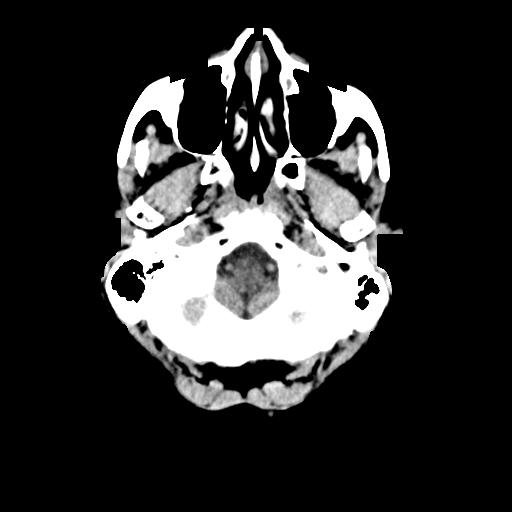
[im 6/34  brain]
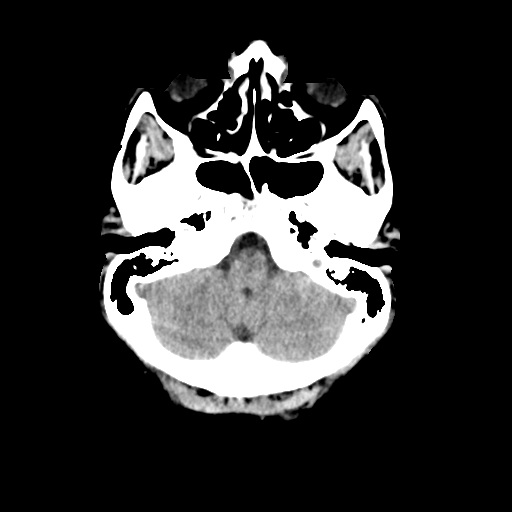
[im 8/34  brain]
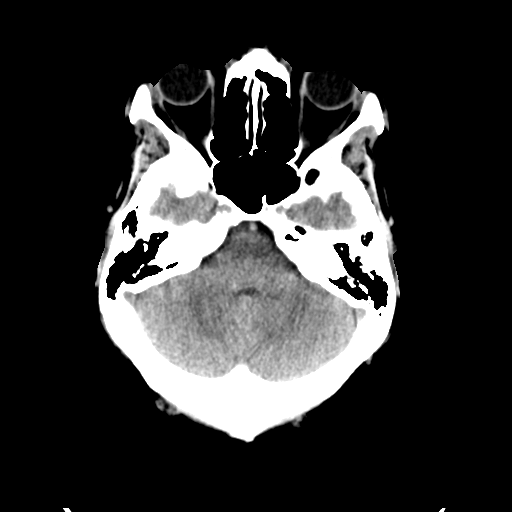
[im 10/34  brain]
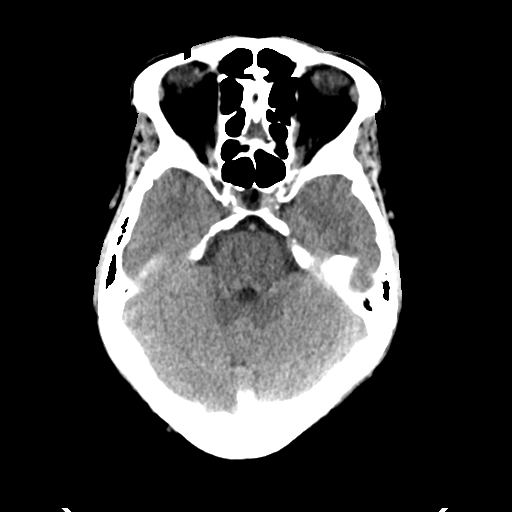
[im 10/34  bone]
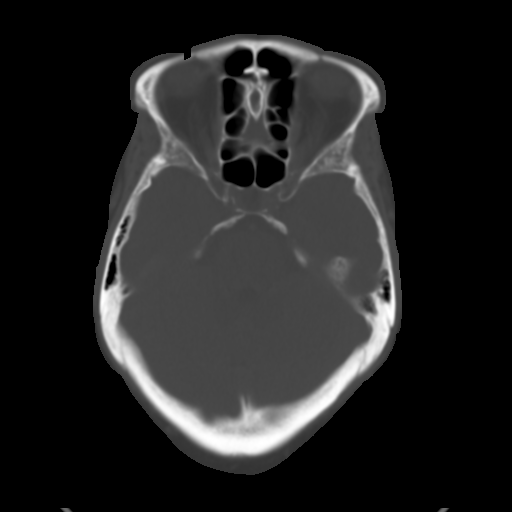
[im 12/34  brain]
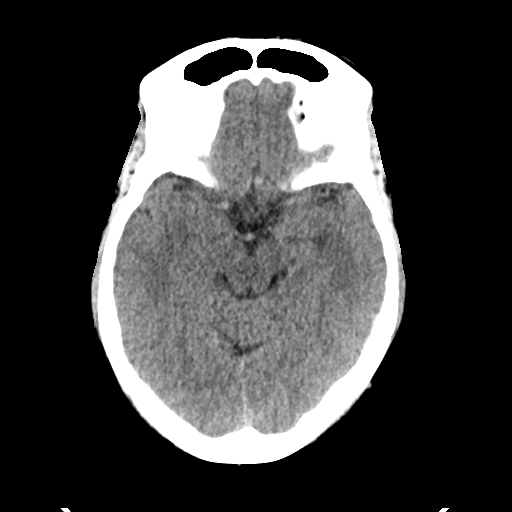
[im 14/34  brain]
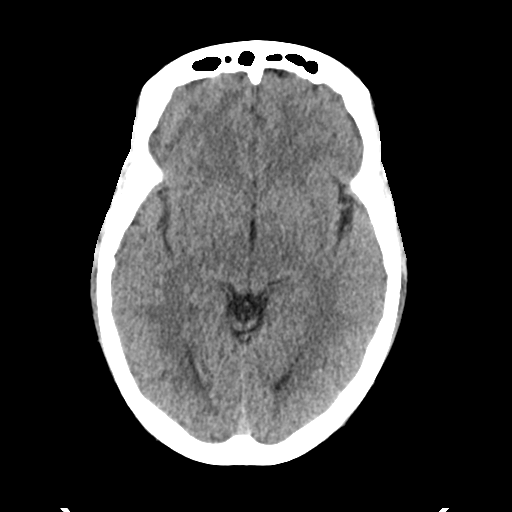
[im 16/34  brain]
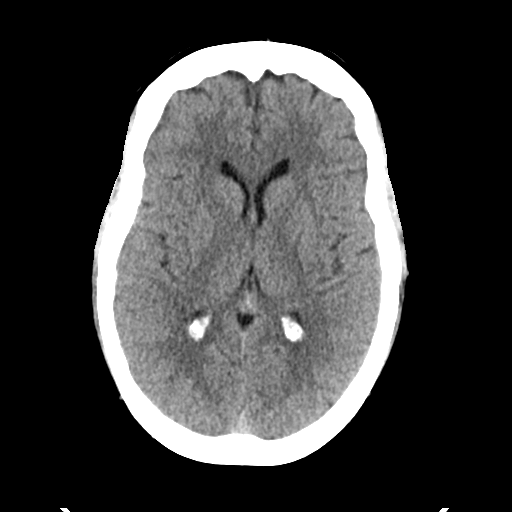
[im 18/34  brain]
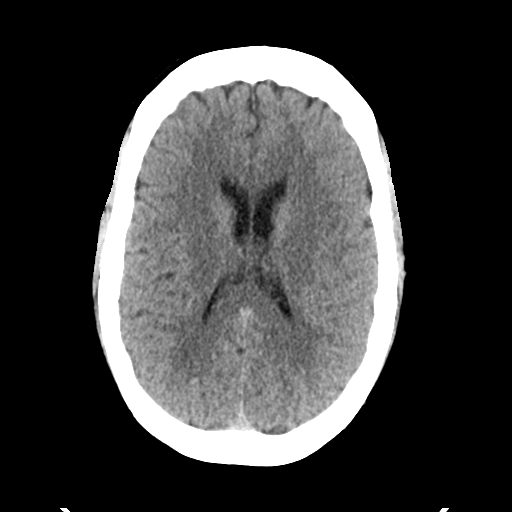
[im 18/34  bone]
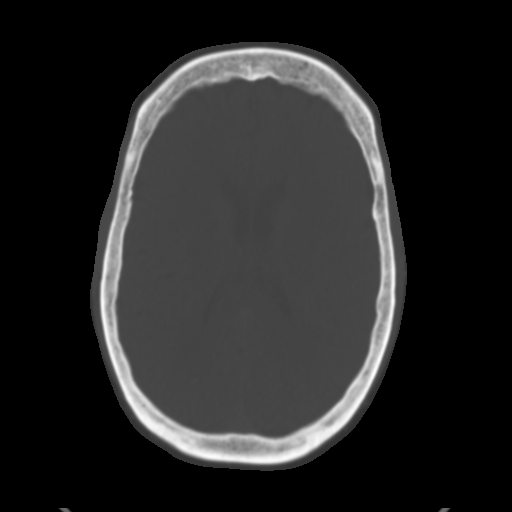
[im 20/34  brain]
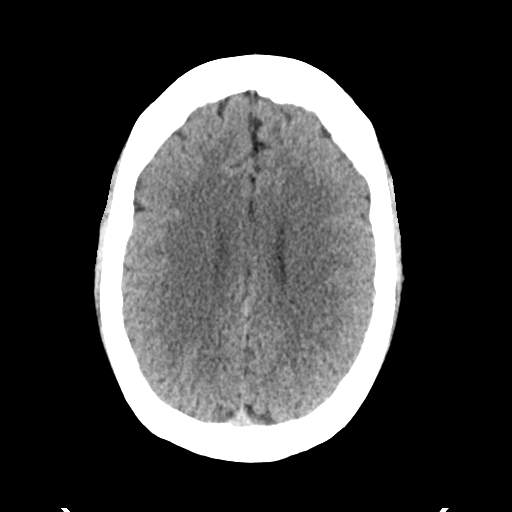
[im 22/34  brain]
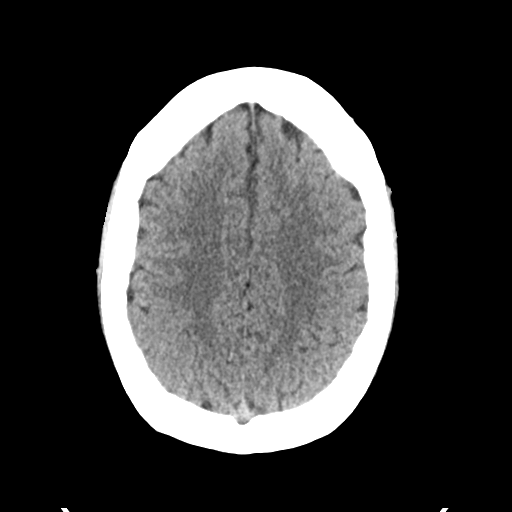
[im 24/34  brain]
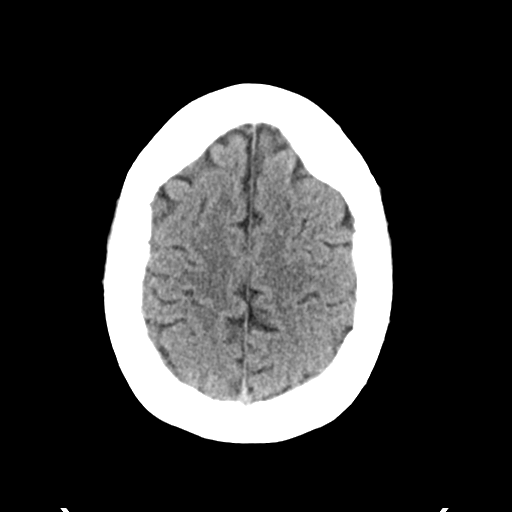
[im 26/34  brain]
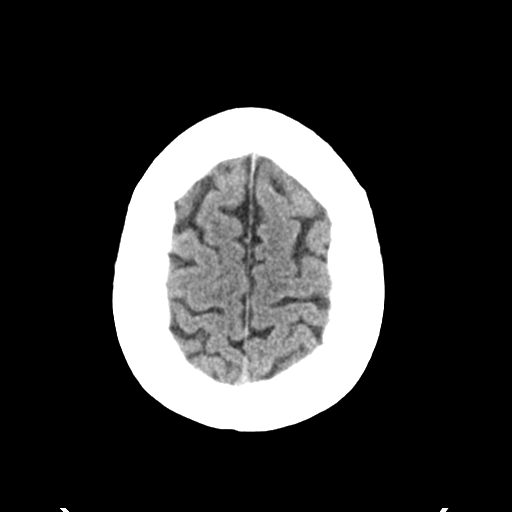
[im 26/34  bone]
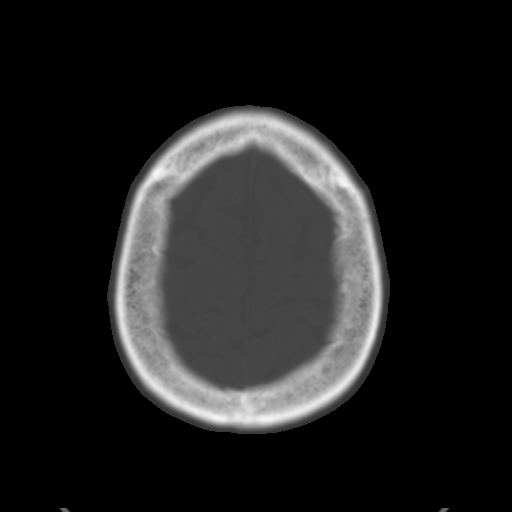
[im 28/34  brain]
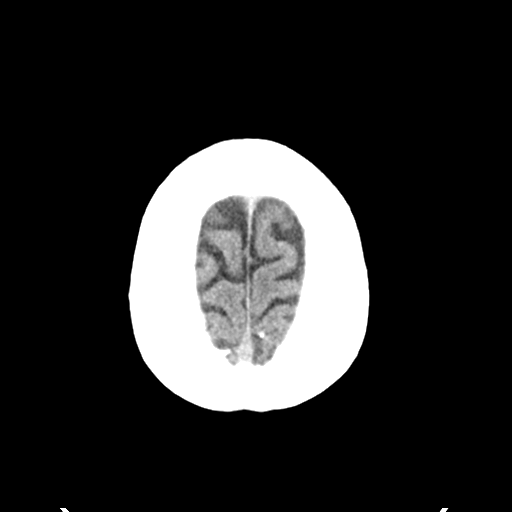
[im 30/34  brain]
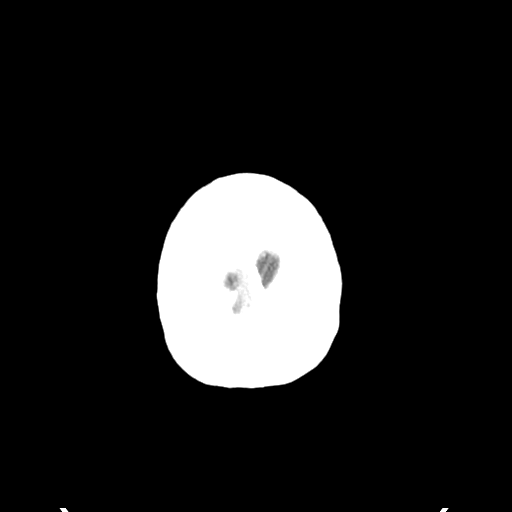
[im 32/34  brain]
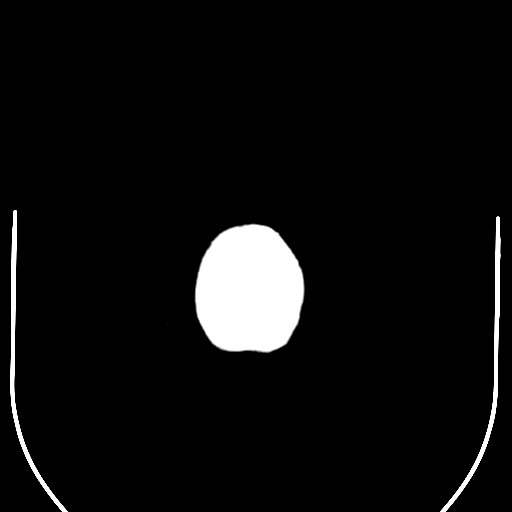

[16 of 30 positions shown; findings below may reference images not displayed]

FINDINGS: The ventricles are normal in size and configuration.

There are no parenchymal masses or mass effect, no evidence of an
infarct, no extra-axial masses or abnormal fluid collections and no
intracranial hemorrhage.

Visualized sinuses and mastoid air cells are clear. No skull lesion.
IMPRESSION: Normal unenhanced CT scan of the brain.

## 2015-06-07 MED ORDER — IOHEXOL 350 MG/ML SOLN
50.0000 mL | Freq: Once | INTRAVENOUS | Status: AC | PRN
  Administered 2015-06-07: 50 mL via INTRAVENOUS

## 2015-06-07 MED ORDER — SODIUM CHLORIDE 0.9 % IV BOLUS (SEPSIS)
1000.0000 mL | Freq: Once | INTRAVENOUS | Status: AC
  Administered 2015-06-07: 1000 mL via INTRAVENOUS

## 2015-06-07 MED ORDER — PROCHLORPERAZINE EDISYLATE 5 MG/ML IJ SOLN
10.0000 mg | Freq: Once | INTRAMUSCULAR | Status: AC
  Administered 2015-06-07: 10 mg via INTRAVENOUS
  Filled 2015-06-07: qty 2

## 2015-06-07 MED ORDER — DIPHENHYDRAMINE HCL 50 MG/ML IJ SOLN
25.0000 mg | Freq: Once | INTRAMUSCULAR | Status: AC
  Administered 2015-06-07: 25 mg via INTRAVENOUS
  Filled 2015-06-07: qty 1

## 2015-06-07 NOTE — ED Notes (Signed)
Pt c/o L sided headache that radiates to L base of skull x 2 weeks.  Denies blurred vision, nausea, limb numbness or weakness.  She thought it might be c/b her tooth, so she had her tooth pulled.  Pt can't bear to touch the L side of forehead with hand b/c it hurts too much.

## 2015-06-07 NOTE — ED Provider Notes (Signed)
CSN: [id number]     Arrival date & time 06/07/15  0813 History   First MD Initiated Contact with Patient 06/07/15 [phone]     Chief Complaint  Patient presents with  . Headache     (Consider location/radiation/quality/duration/timing/severity/associated sxs/prior Treatment) HPI Comments: 55 year old female who presents with headache. Patient states that for the past 2 weeks she has had an intermittent, severe left-sided headache that starts on her forehead and near her left eye and radiates over the top of her head to the base of her skull. She initially thought that it may be related to a tooth and had a tooth pulled but states that it did not improve her pain. She states that sometimes her forehead and scalp becomes sensitive to the touch. She denies any change in pain when chewing. She denies any history of headaches or recent head injury. She denies any associated visual changes, nausea, vomiting, extremity numbness/weakness. No fevers or recent illness. She has noted that sometimes her left eye waters when her pain is severe.  Patient is a 55 y.o. female presenting with headaches. The history is provided by the patient.  Headache   Past Medical History  Diagnosis Date  . Eczema   . Thyroid disease   . Hypothyroid    Past Surgical History  Procedure Laterality Date  . Abdominal hysterectomy     Family History  Problem Relation Age of Onset  . Hypertension Mother   . Hypertension Sister   . Hypertension Brother    Social History  Substance Use Topics  . Smoking status: Current Every Day Smoker -- 1.00 packs/day    Types: Cigarettes  . Smokeless tobacco: Never Used  . Alcohol Use: Yes     Comment: rarely   OB History    No data available     Review of Systems  Neurological: Positive for headaches.   10 Systems reviewed and are negative for acute change except as noted in the HPI.    Allergies  Dilaudid and Tramadol  Home Medications   Prior to Admission  medications   Medication Sig Start Date End Date Taking? Authorizing Provider  acetaminophen (TYLENOL) 500 MG tablet Take 1,000 mg by mouth 2 (two) times daily as needed for mild pain or headache.   Yes Historical Provider, MD  diclofenac (VOLTAREN) 75 MG EC tablet Take 75 mg by mouth 2 (two) times daily. 06/03/15  Yes Historical Provider, MD  diphenhydramine-acetaminophen (TYLENOL PM) 25-500 MG TABS tablet Take 1 tablet by mouth at bedtime as needed (for sleep).   Yes Historical Provider, MD  HYDROcodone-acetaminophen (NORCO/VICODIN) 5-325 MG tablet Take 1 tablet by mouth every 4 (four) hours as needed for moderate pain.  06/04/15  Yes Historical Provider, MD  ibuprofen (ADVIL,MOTRIN) 200 MG tablet Take 800 mg by mouth every 6 (six) hours as needed. pain   Yes Historical Provider, MD  levothyroxine (SYNTHROID, LEVOTHROID) 75 MCG tablet Take 75 mcg by mouth daily before breakfast.   Yes Historical Provider, MD  LORazepam (ATIVAN) 0.5 MG tablet Take 0.5 mg by mouth daily as needed for anxiety.  06/03/15  Yes Historical Provider, MD  naproxen sodium (ANAPROX) 220 MG tablet Take 220 mg by mouth daily as needed (for pain or headache).   Yes Historical Provider, MD  LORazepam (ATIVAN) 1 MG tablet Take 1 tablet (1 mg total) by mouth every 8 (eight) hours as needed for anxiety. No driving when taking. Patient not taking: Reported on 06/07/2015 03/30/14   Lajean Saver, MD  methocarbamol (ROBAXIN) 500 MG tablet Take 1 tablet (500 mg total) by mouth every 8 (eight) hours as needed for muscle spasms. Patient not taking: Reported on 03/30/2014 11/15/13   Mercedes Camprubi-Soms, PA-C  naproxen (NAPROSYN) 500 MG tablet Take 1 tablet (500 mg total) by mouth 2 (two) times daily. Patient not taking: Reported on 04/16/2015 04/12/15   Fredia Sorrow, MD   BP 129/89 mmHg  Pulse 90  Temp(Src) 97.7 F (36.5 C) (Oral)  Resp 18  Ht 5' 9"  (1.753 m)  Wt 213 lb (96.616 kg)  BMI 31.44 kg/m2  SpO2 98% Physical Exam   Constitutional: She is oriented to person, place, and time. She appears well-developed and well-nourished. No distress.  Awake, alert; uncomfortable  HENT:  Head: Normocephalic and atraumatic.  Eyes: EOM are normal. Pupils are equal, round, and reactive to light. Right conjunctiva is injected. Left conjunctiva is injected.  L eye tearing  Neck: Neck supple.  Cardiovascular: Normal rate, regular rhythm and normal heart sounds.   No murmur heard. Pulmonary/Chest: Effort normal and breath sounds normal. No respiratory distress.  Abdominal: Soft. Bowel sounds are normal. She exhibits no distension.  Musculoskeletal: She exhibits no edema.  5/5 strength and sensation x all 4 ext  Neurological: She is alert and oriented to person, place, and time. She has normal reflexes. No cranial nerve deficit. She exhibits normal muscle tone.  Fluent speech, normal finger-to-nose testing, negative pronator drift; no clonus  Skin: Skin is warm and dry. No rash noted.  Psychiatric: She has a normal mood and affect. Judgment and thought content normal.  Nursing note and vitals reviewed.   ED Course  Procedures (including critical care time) Labs Review Labs Reviewed  BASIC METABOLIC PANEL  CBC WITH DIFFERENTIAL/PLATELET  SEDIMENTATION RATE    Imaging Review Ct Angio Head W/cm &/or Wo Cm  06/07/2015  CLINICAL DATA:  Severe left-sided headache EXAM: CT ANGIOGRAPHY HEAD AND NECK TECHNIQUE: Multidetector CT imaging of the head and neck was performed using the standard protocol during bolus administration of intravenous contrast. Multiplanar CT image reconstructions and MIPs were obtained to evaluate the vascular anatomy. Carotid stenosis measurements (when applicable) are obtained utilizing NASCET criteria, using the distal internal carotid diameter as the denominator. CONTRAST:  10m OMNIPAQUE IOHEXOL 350 MG/ML SOLN COMPARISON:  CT head 06/07/2015 FINDINGS: CTA NECK Aortic arch: Normal aortic arch. Proximal  great vessels widely patent. Lung apices clear. Right carotid system: Right common carotid artery widely patent. Minimal atherosclerotic disease in the carotid bulb without carotid stenosis or dissection Left carotid system: Left common carotid artery widely patent. Left carotid bifurcation normal without atherosclerotic disease or dissection. No significant stenosis. Vertebral arteries:Both vertebral arteries patent to the basilar without stenosis or dissection. Skeleton: Mild cervical spine degenerative change. Negative for fracture or mass lesion. Other neck: Negative for adenopathy in the neck. CTA HEAD Anterior circulation: Mild atherosclerotic calcification right cavernous carotid without stenosis. Right anterior and right middle cerebral arteries patent without stenosis Left cavernous carotid widely patent with minimal atherosclerotic calcification. Left anterior and middle cerebral arteries widely patent without stenosis. Posterior circulation: Both vertebral arteries patent to the basilar. PICA patent bilaterally. Basilar widely patent. Superior cerebellar and posterior cerebral arteries widely patent. Venous sinuses: Negative Anatomic variants: None Delayed phase: Normal enhancement on delayed imaging. IMPRESSION: Negative CTA of the head neck. No significant arterial stenosis. Negative for vascular malformation. Electronically Signed   By: CFranchot GalloM.D.   On: 06/07/2015 13:45   Ct Head Wo Contrast  06/07/2015  CLINICAL DATA:  Pt c/o L sided headache that radiates to L base of skull x 2 weeks. Denies blurred vision, nausea, limb numbness or weakness. She thought it might be c/b her tooth, so she had her tooth pulled. Pt can't bear to touch the L side of forehead with hand b/c it hurts too much. EXAM: CT HEAD WITHOUT CONTRAST TECHNIQUE: Contiguous axial images were obtained from the base of the skull through the vertex without intravenous contrast. COMPARISON:  03/30/2014 FINDINGS: The ventricles  are normal in size and configuration. There are no parenchymal masses or mass effect, no evidence of an infarct, no extra-axial masses or abnormal fluid collections and no intracranial hemorrhage. Visualized sinuses and mastoid air cells are clear. No skull lesion. IMPRESSION: Normal unenhanced CT scan of the brain. Electronically Signed   By: Lajean Manes M.D.   On: 06/07/2015 10:12   Ct Angio Neck W/cm &/or Wo/cm  06/07/2015  CLINICAL DATA:  Severe left-sided headache EXAM: CT ANGIOGRAPHY HEAD AND NECK TECHNIQUE: Multidetector CT imaging of the head and neck was performed using the standard protocol during bolus administration of intravenous contrast. Multiplanar CT image reconstructions and MIPs were obtained to evaluate the vascular anatomy. Carotid stenosis measurements (when applicable) are obtained utilizing NASCET criteria, using the distal internal carotid diameter as the denominator. CONTRAST:  85m OMNIPAQUE IOHEXOL 350 MG/ML SOLN COMPARISON:  CT head 06/07/2015 FINDINGS: CTA NECK Aortic arch: Normal aortic arch. Proximal great vessels widely patent. Lung apices clear. Right carotid system: Right common carotid artery widely patent. Minimal atherosclerotic disease in the carotid bulb without carotid stenosis or dissection Left carotid system: Left common carotid artery widely patent. Left carotid bifurcation normal without atherosclerotic disease or dissection. No significant stenosis. Vertebral arteries:Both vertebral arteries patent to the basilar without stenosis or dissection. Skeleton: Mild cervical spine degenerative change. Negative for fracture or mass lesion. Other neck: Negative for adenopathy in the neck. CTA HEAD Anterior circulation: Mild atherosclerotic calcification right cavernous carotid without stenosis. Right anterior and right middle cerebral arteries patent without stenosis Left cavernous carotid widely patent with minimal atherosclerotic calcification. Left anterior and middle  cerebral arteries widely patent without stenosis. Posterior circulation: Both vertebral arteries patent to the basilar. PICA patent bilaterally. Basilar widely patent. Superior cerebellar and posterior cerebral arteries widely patent. Venous sinuses: Negative Anatomic variants: None Delayed phase: Normal enhancement on delayed imaging. IMPRESSION: Negative CTA of the head neck. No significant arterial stenosis. Negative for vascular malformation. Electronically Signed   By: CFranchot GalloM.D.   On: 06/07/2015 13:45   I have personally reviewed and evaluated these lab results as part of my medical decision-making.   EKG Interpretation None     Medications  diphenhydrAMINE (BENADRYL) injection 25 mg (25 mg Intravenous Given 06/07/15 1250)  prochlorperazine (COMPAZINE) injection 10 mg (10 mg Intravenous Given 06/07/15 1250)  sodium chloride 0.9 % bolus 1,000 mL (1,000 mLs Intravenous New Bag/Given 06/07/15 1246)  iohexol (OMNIPAQUE) 350 MG/ML injection 50 mL (50 mLs Intravenous Contrast Given 06/07/15 1304)    MDM   Final diagnoses:  Headache    Pt p/w 2 weeks of intermittent L sided headache occasionally associated with left eye tearing. No recent head injury and she denies any other neurologic symptoms. She was uncomfortable on exam. Vital signs unremarkable. Normal neurologic exam. She did have conjunctival injection bilaterally and left eye tearing. DDx is broad and includes cluster HA, tension HA, migraines, temporal arteritis, trigeminal neuralgia. I considered zoster infection given sensitivity to  touch but given no skin changes over 2 weeks, I feel this is very unlikely. Placed pt on O2 [place] and obtained above labs as well as head CT.  Labs were unremarkable and showed a normal ESR. No significant change with oxygen therapy, therefore cluster headache is unlikely. Gave the patient Benadryl and Compazine. Head CT was was negative for acute process. Because of the severe nature of the patient's  headaches, obtained CTA of head and neck to rule out vascular pathology. CTA was negative. On reexamination, the patient was sleeping comfortably and stated that her headache was improved. I discussed importance of follow-up with PCP for further evaluation of headache as well as follow-up with ophthalmologist for routine eye exam. Return precautions including development of any new neurologic symptoms are reviewed. Patient voiced understanding and was discharged in satisfactory condition.   Sharlett Iles, MD 06/07/15 [phone]

## 2015-06-07 NOTE — ED Notes (Signed)
Pt requesting not to have medication ordered and to be d/c, MD Little made aware.

## 2015-06-07 NOTE — Discharge Instructions (Signed)

## 2016-09-06 ENCOUNTER — Emergency Department (HOSPITAL_COMMUNITY): Payer: 59

## 2016-09-06 ENCOUNTER — Emergency Department (HOSPITAL_COMMUNITY)
Admission: EM | Admit: 2016-09-06 | Discharge: 2016-09-06 | Disposition: A | Discharge status: Home / Self Care | Payer: 59 | Attending: Emergency Medicine | Admitting: Emergency Medicine

## 2016-09-06 ENCOUNTER — Encounter (HOSPITAL_COMMUNITY): Payer: Self-pay | Admitting: Emergency Medicine

## 2016-09-06 DIAGNOSIS — G44209 Tension-type headache, unspecified, not intractable: Secondary | ICD-10-CM

## 2016-09-06 DIAGNOSIS — E039 Hypothyroidism, unspecified: Secondary | ICD-10-CM | POA: Insufficient documentation

## 2016-09-06 DIAGNOSIS — F17210 Nicotine dependence, cigarettes, uncomplicated: Secondary | ICD-10-CM | POA: Insufficient documentation

## 2016-09-06 DIAGNOSIS — Z79899 Other long term (current) drug therapy: Secondary | ICD-10-CM | POA: Diagnosis not present

## 2016-09-06 DIAGNOSIS — M542 Cervicalgia: Secondary | ICD-10-CM | POA: Insufficient documentation

## 2016-09-06 DIAGNOSIS — R51 Headache: Secondary | ICD-10-CM | POA: Diagnosis present

## 2016-09-06 DIAGNOSIS — R519 Headache, unspecified: Secondary | ICD-10-CM

## 2016-09-06 LAB — I-STAT CHEM 8, ED
BUN: 11 mg/dL (ref 6–20)
CHLORIDE: 105 mmol/L (ref 101–111)
Calcium, Ion: 1.14 mmol/L — ABNORMAL LOW (ref 1.15–1.40)
Creatinine, Ser: 0.9 mg/dL (ref 0.44–1.00)
Glucose, Bld: 93 mg/dL (ref 65–99)
HEMATOCRIT: 42 % (ref 36.0–46.0)
Hemoglobin: 14.3 g/dL (ref 12.0–15.0)
POTASSIUM: 3.5 mmol/L (ref 3.5–5.1)
SODIUM: 142 mmol/L (ref 135–145)
TCO2: 25 mmol/L (ref 0–100)

## 2016-09-06 IMAGING — CT CT HEAD W/O CM
4 series · 16 of 47 positions shown, 18 images · non-contrast
Comparison: Prior CT from [DATE].

CLINICAL DATA: Initial evaluation for acute headache, neck pain,
dizziness.

EXAM:
CT HEAD WITHOUT CONTRAST
TECHNIQUE: Contiguous axial images were obtained from the base of the skull
through the vertex without intravenous contrast.

[Series 3: head without · axial · non-contrast · 0.46mm/px · z∈[-72,+48]mm · 7 of 33 slices shown, 9 images]
[im 5/33  brain]
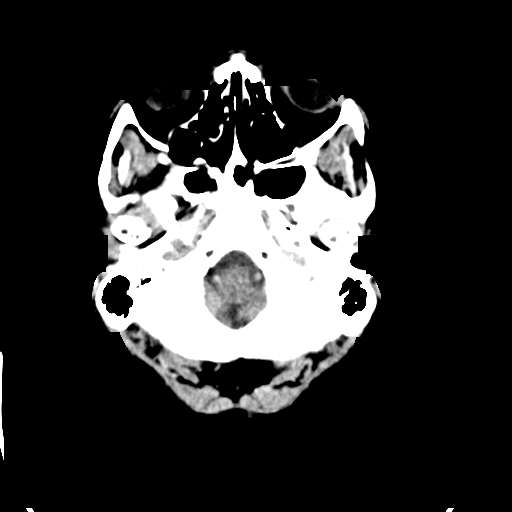
[im 5/33  bone]
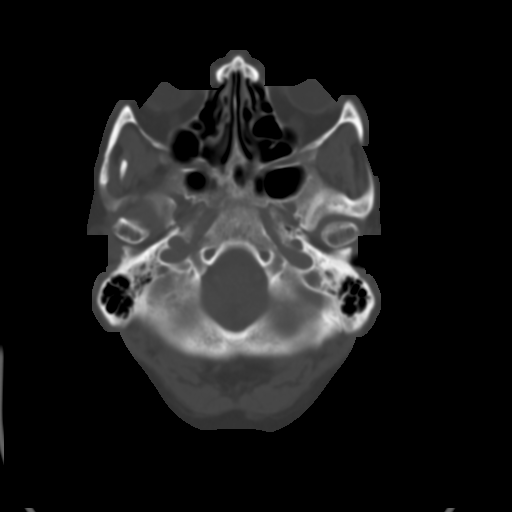
[im 9/33  brain]
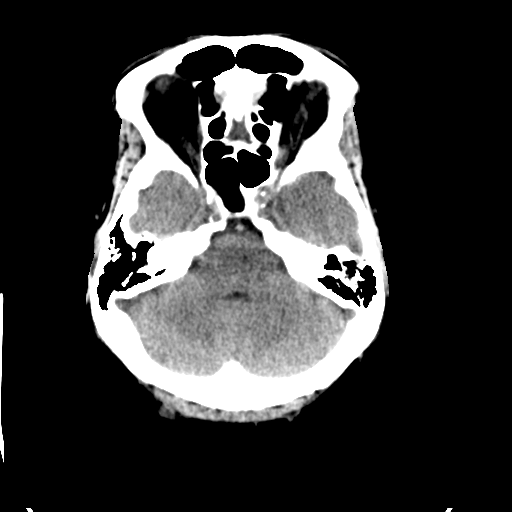
[im 13/33  brain]
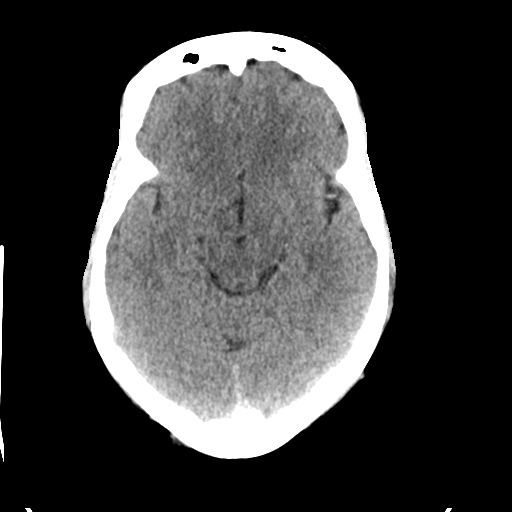
[im 17/33  brain]
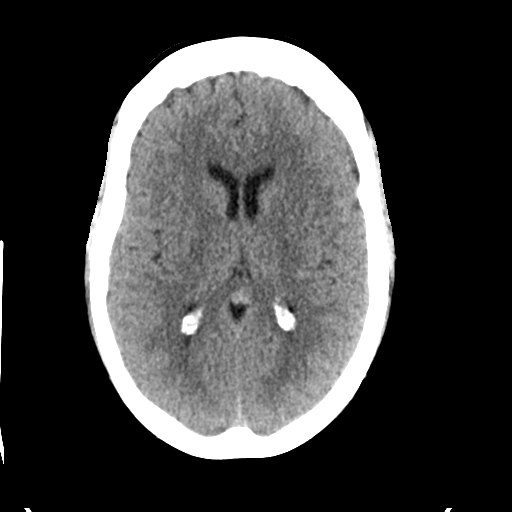
[im 21/33  brain]
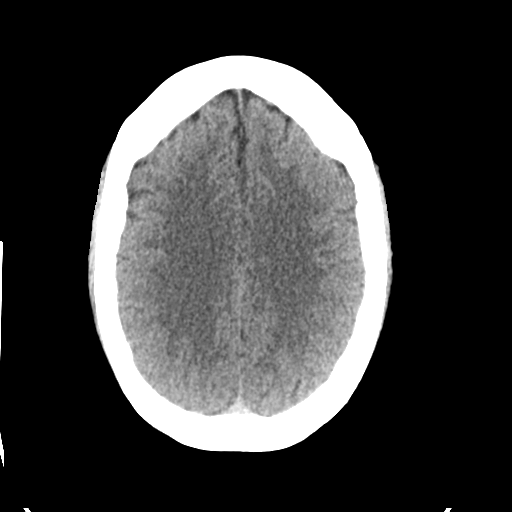
[im 21/33  bone]
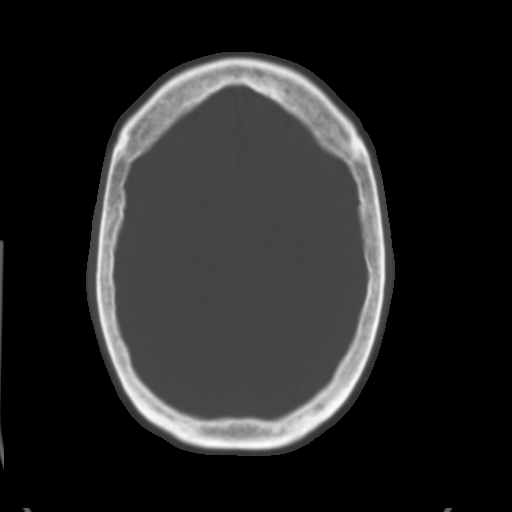
[im 25/33  brain]
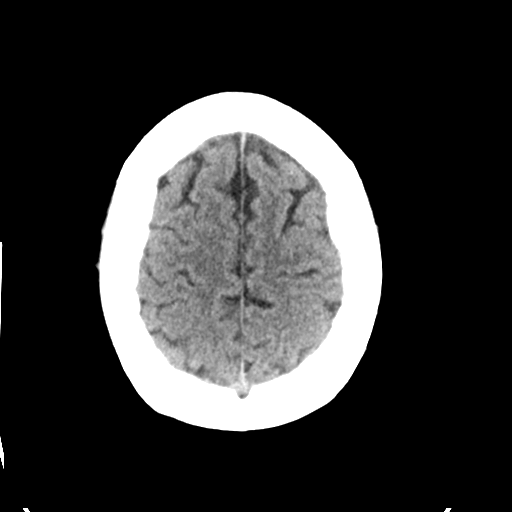
[im 29/33  brain]
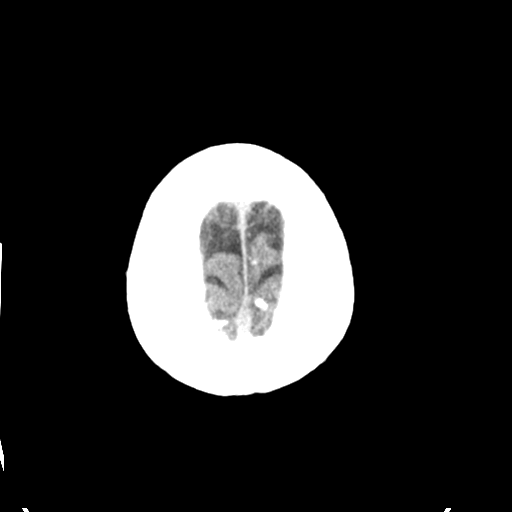

[Series 4: head bone · axial · 0.46mm/px · z∈[-76,-44]mm · 3 of 81 slices shown]
[im 9/81  bone]
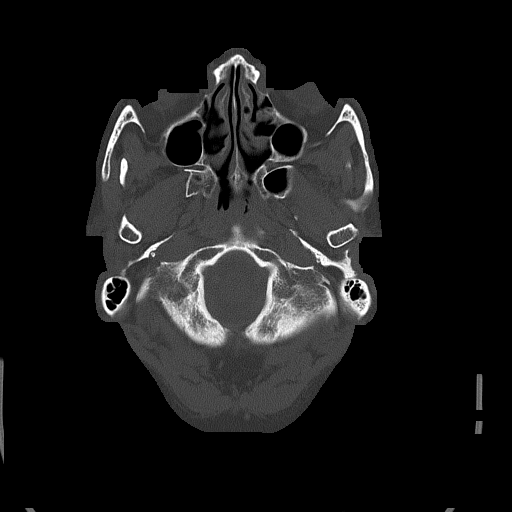
[im 17/81  bone]
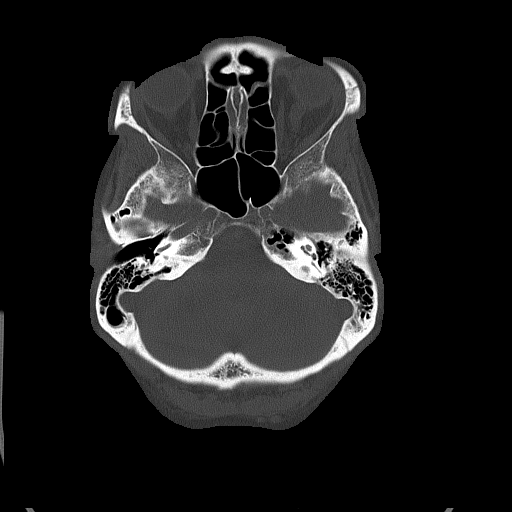
[im 25/81  bone]
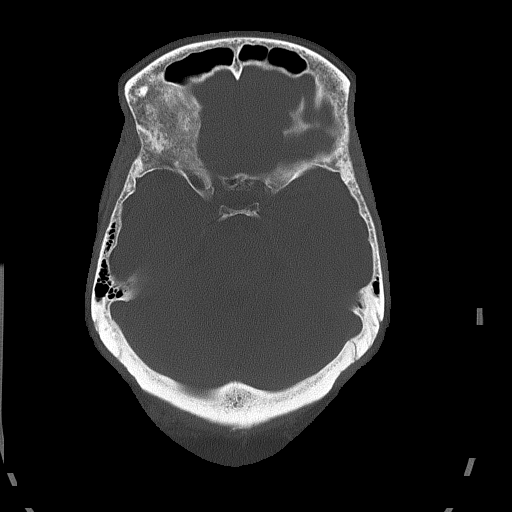

[Series 5: head without cor · coronal · non-contrast · 0.29mm/px · 3 of 67 slices shown]
[im 23/67  brain]
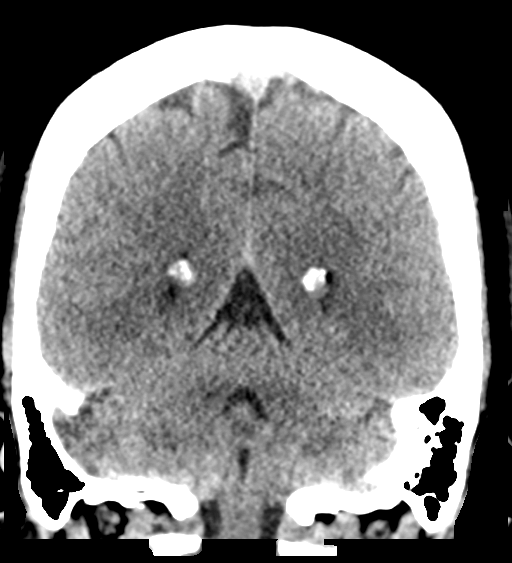
[im 30/67  brain]
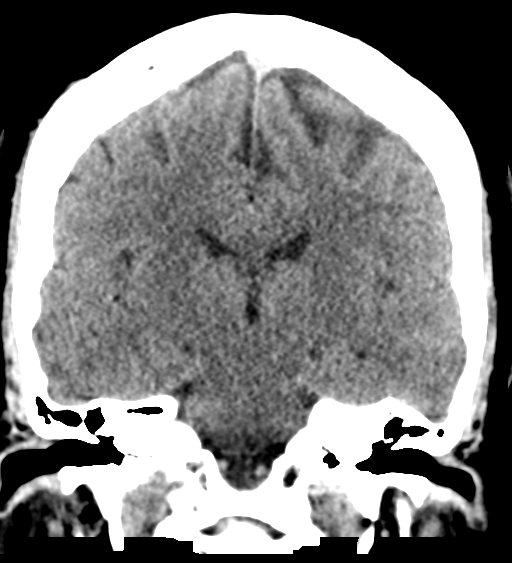
[im 37/67  brain]
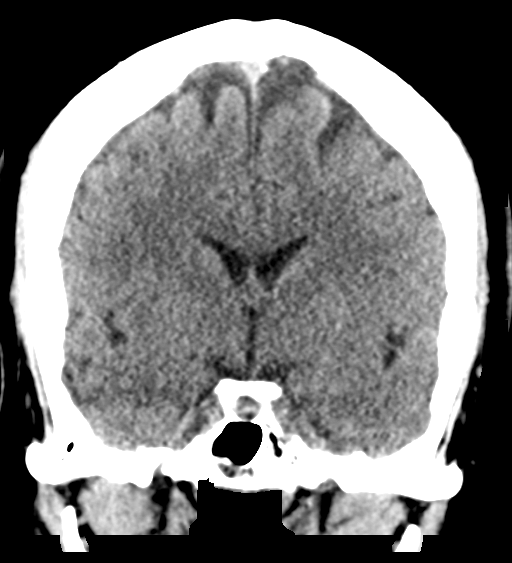

[Series 6: head without sag · sagittal · non-contrast · 0.34mm/px · 3 of 49 slices shown]
[im 17/49  brain]
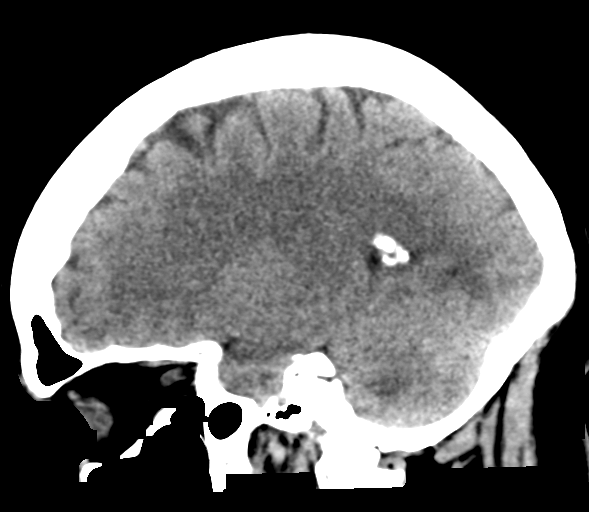
[im 25/49  brain]
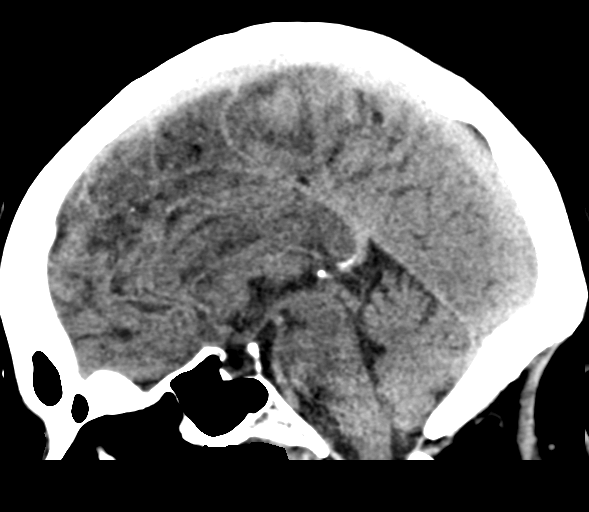
[im 33/49  brain]
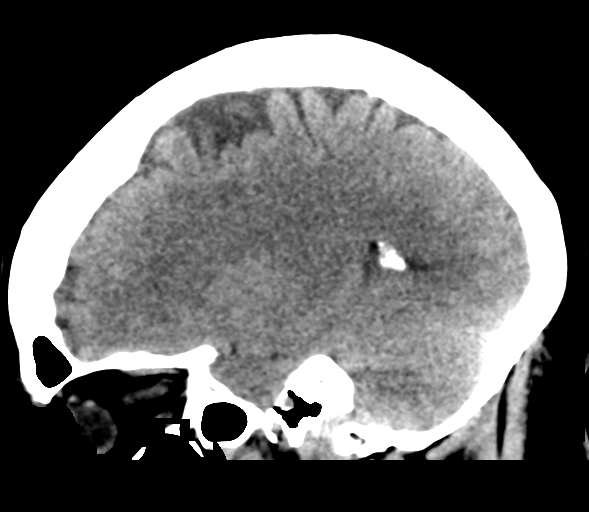

[16 of 47 positions shown; findings below may reference images not displayed]

FINDINGS: Brain: Cerebral volume within normal limits for patient age.

No evidence for acute intracranial hemorrhage. No findings to
suggest acute large vessel territory infarct. No mass lesion,
midline shift, or mass effect. Ventricles are normal in size without
evidence for hydrocephalus. No extra-axial fluid collection
identified.

Vascular: No hyperdense vessel identified.Scattered vascular
calcifications noted within the carotid siphons.

Skull: Scalp soft tissues demonstrate no acute abnormality.Calvarium
intact.

Sinuses/Orbits: Globes and orbital soft tissues are within normal
limits.

Visualized paranasal sinuses are clear. No mastoid effusion.
IMPRESSION: Normal head CT.  No acute intracranial process identified.

## 2016-09-06 MED ORDER — CYCLOBENZAPRINE HCL 5 MG PO TABS
5.0000 mg | ORAL_TABLET | Freq: Two times a day (BID) | ORAL | 0 refills | Status: DC | PRN
Start: 2016-09-06 — End: 2018-04-04

## 2016-09-06 MED ORDER — NAPROXEN 500 MG PO TABS: 500.0000 mg | ORAL_TABLET | Freq: Two times a day (BID) | ORAL | 0 refills | Status: DC

## 2016-09-06 MED ORDER — CYCLOBENZAPRINE HCL 10 MG PO TABS
5.0000 mg | ORAL_TABLET | Freq: Once | ORAL | Status: DC
  Filled 2016-09-06: qty 1

## 2016-09-06 MED ORDER — IOPAMIDOL (ISOVUE-370) INJECTION 76%
INTRAVENOUS | Status: AC
  Administered 2016-09-06: 50 mL
  Filled 2016-09-06: qty 50

## 2016-09-06 MED ORDER — NAPROXEN 250 MG PO TABS
500.0000 mg | ORAL_TABLET | Freq: Once | ORAL | Status: AC
  Administered 2016-09-06: 500 mg via ORAL
  Filled 2016-09-06: qty 2

## 2016-09-06 NOTE — ED Triage Notes (Signed)
Pt to ED c/o intermittent headache x 6 days with feelings of "wooziness all the time." Patient states it started with posterior neck pain last Wednesday, she saw her PCP who said it was her muscles. Pt then stated that the pain went up to the right side of her head and has been intermittent along with enlarged lymph nodes on the R side as well. Pt reports pain when moving neck side to side, but is able to freely. Denies N/V/fevers/photosensitivity.

## 2016-09-06 NOTE — ED Provider Notes (Signed)
[facility]-EMERGENCY DEPT Provider Note   CSN: [id number] Arrival date & time: 09/06/16  0228  By signing my name below, I, Rosario Adie, attest that this documentation has been prepared under the direction and in the presence of Alaijah Gibler, Mayer Masker, MD. Electronically Signed: Rosario Adie, ED Scribe. 09/06/16. 3:57 AM.  History   Chief Complaint Chief Complaint  Patient presents with  . Headache   The history is provided by the patient. No language interpreter was used.    HPI Comments: Jessica Cooke is a 56 y.o. female who presents to the Emergency Department complaining of intermittent, unchanged right-sided beginning five days ago. She rates her current pain as 4-5/10. She notes associated dizziness and right ear pain. Per pt, her pain initially began as right-sided neck pain and has progressed into a right-sided headache as well. She was seen by her PCP for this and prescribed Gabapentin at that time; however, she opted not to take this. Pt has been taking Motrin with some relief of her pain. Her pain is worse with movement of her neck to the right and with coughing. No h/o similar headaches or migraines. She is a current, everyday smoker, but she denies any alcohol usage. She denies fever, neck stiffness, numbness, weakness, paraesthesias, facial droop, aphasia, nausea, vomiting, or any other associated symptoms.   Past Medical History:  Diagnosis Date  . Eczema   . Hypothyroid   . Thyroid disease    There are no active problems to display for this patient.  Past Surgical History:  Procedure Laterality Date  . ABDOMINAL HYSTERECTOMY     OB History    No data available     Home Medications    Prior to Admission medications   Medication Sig Start Date End Date Taking? Authorizing Provider  hydrOXYzine (ATARAX/VISTARIL) 25 MG tablet Take 25 mg by mouth at bedtime. 08/30/16  Yes [provider]  levothyroxine (SYNTHROID, LEVOTHROID) 100 MCG tablet Take 100  mcg by mouth daily before breakfast.   Yes [provider]  cyclobenzaprine (FLEXERIL) 5 MG tablet Take 1 tablet (5 mg total) by mouth 2 (two) times daily as needed for muscle spasms. 09/06/16   Urho Rio, Mayer Masker, MD  LORazepam (ATIVAN) 1 MG tablet Take 1 tablet (1 mg total) by mouth every 8 (eight) hours as needed for anxiety. No driving when taking. Patient not taking: Reported on 06/07/2015 03/30/14   Cathren Laine, MD  methocarbamol (ROBAXIN) 500 MG tablet Take 1 tablet (500 mg total) by mouth every 8 (eight) hours as needed for muscle spasms. Patient not taking: Reported on 03/30/2014 11/15/13   Street, [place], PA-C  naproxen (NAPROSYN) 500 MG tablet Take 1 tablet (500 mg total) by mouth 2 (two) times daily. Patient not taking: Reported on 04/16/2015 04/12/15   Vanetta Mulders, MD  naproxen (NAPROSYN) 500 MG tablet Take 1 tablet (500 mg total) by mouth 2 (two) times daily. 09/06/16   Ephram Kornegay, Mayer Masker, MD   Family History Family History  Problem Relation Age of Onset  . Hypertension Mother   . Hypertension Sister   . Hypertension Brother    Social History Social History  Substance Use Topics  . Smoking status: Current Every Day Smoker    Packs/day: 1.00    Types: Cigarettes  . Smokeless tobacco: Never Used  . Alcohol use Yes     Comment: rarely   Allergies   Dilaudid [hydromorphone hcl] and Tramadol  Review of Systems Review of Systems  Constitutional: Negative for  fever.  HENT: Positive for ear pain.   Gastrointestinal: Negative for nausea and vomiting.  Musculoskeletal: Positive for myalgias and neck pain. Negative for neck stiffness.  Neurological: Positive for dizziness and headaches. Negative for facial asymmetry, speech difficulty, weakness and numbness.  All other systems reviewed and are negative.  Physical Exam Updated Vital Signs BP 109/66 (BP Location: Right Arm)   Pulse 91   Temp 98.1 F (36.7 C) (Oral)   Resp 17   Ht 5\' 8"  (1.727 m)   Wt 95.7  kg (211 lb)   SpO2 94%   BMI 32.08 kg/m   Physical Exam  Constitutional: She is oriented to person, place, and time. She appears well-developed and well-nourished. No distress.  HENT:  Head: Normocephalic and atraumatic.  Right TM with effusion,skin erythema, intact light reflex  Eyes: EOM are normal. Pupils are equal, round, and reactive to light.  Neck: Normal range of motion. Neck supple.  No meningismus, right posterior auricular adenopathy  Cardiovascular: Normal rate, regular rhythm and normal heart sounds.   No murmur heard. Pulmonary/Chest: Effort normal and breath sounds normal. No respiratory distress. She has no wheezes.  Abdominal: Soft. There is no tenderness.  Neurological: She is alert and oriented to person, place, and time.  CN 2-12 intact; no dysmetria to finger-nose-finger, 5 out of 5 strength in all 4 extremities  Skin: Skin is warm and dry.  Psychiatric: She has a normal mood and affect.  Nursing note and vitals reviewed.  ED Treatments / Results  DIAGNOSTIC STUDIES: Oxygen Saturation is 92% on RA, low by my interpretation.   COORDINATION OF CARE: 3:57 AM-Discussed next steps with pt. Pt verbalized understanding and is agreeable with the plan.   Labs (all labs ordered are listed, but only abnormal results are displayed) Labs Reviewed  I-STAT CHEM 8, ED - Abnormal; Notable for the following:       Result Value   Calcium, Ion 1.14 (*)    All other components within normal limits    EKG  EKG Interpretation None      Radiology Ct Head Wo Contrast  Result Date: 09/06/2016 CLINICAL DATA:  Initial evaluation for acute headache, neck pain, dizziness. EXAM: CT HEAD WITHOUT CONTRAST TECHNIQUE: Contiguous axial images were obtained from the base of the skull through the vertex without intravenous contrast. COMPARISON:  Prior CT from 06/07/2015. FINDINGS: Brain: Cerebral volume within normal limits for patient age. No evidence for acute intracranial  hemorrhage. No findings to suggest acute large vessel territory infarct. No mass lesion, midline shift, or mass effect. Ventricles are normal in size without evidence for hydrocephalus. No extra-axial fluid collection identified. Vascular: No hyperdense vessel identified.Scattered vascular calcifications noted within the carotid siphons. Skull: Scalp soft tissues demonstrate no acute abnormality.Calvarium intact. Sinuses/Orbits: Globes and orbital soft tissues are within normal limits. Visualized paranasal sinuses are clear. No mastoid effusion. IMPRESSION: Normal head CT.  No acute intracranial process identified. Electronically Signed   By: Rise MuBenjamin  McClintock M.D.   On: 09/06/2016 05:18    Procedures Procedures   Medications Ordered in ED Medications  cyclobenzaprine (FLEXERIL) tablet 5 mg (5 mg Oral Not Given 09/06/16 0511)  iopamidol (ISOVUE-370) 76 % injection (50 mLs  Contrast Given 09/06/16 0438)  naproxen (NAPROSYN) tablet 500 mg (500 mg Oral Given 09/06/16 0541)    Initial Impression / Assessment and Plan / ED Course  I have reviewed the triage vital signs and the nursing notes.  Pertinent labs & imaging results that were available during  my care of the patient were reviewed by me and considered in my medical decision making (see chart for details).     Patient presents with headache and neck pain. She also reports earache. She has adenopathy on exam. She is nontoxic-appearing. Vital signs reassuring. Afebrile. No meningismus. Pain is reproducible on exam with tenderness palpation of the posterior neck. Discuss with patient nasal saline to clear out her right ear effusion. She was given naproxen and Flexeril. She declined Flexeril. CT scan is negative for acute intracranial process. This is reassuring. We'll have patient follow-up closely with her primary physician.  After history, exam, and medical workup I feel the patient has been appropriately medically screened and is safe for  discharge home. Pertinent diagnoses were discussed with the patient. Patient was given return precautions.   Final Clinical Impressions(s) / ED Diagnoses   Final diagnoses:  Acute non intractable tension-type headache   New Prescriptions Discharge Medication List as of 09/06/2016  6:40 AM    START taking these medications   Details  cyclobenzaprine (FLEXERIL) 5 MG tablet Take 1 tablet (5 mg total) by mouth 2 (two) times daily as needed for muscle spasms., Starting Tue 09/06/2016, Print    !! naproxen (NAPROSYN) 500 MG tablet Take 1 tablet (500 mg total) by mouth 2 (two) times daily., Starting Tue 09/06/2016, Print     !! - Potential duplicate medications found. Please discuss with provider.     I personally performed the services described in this documentation, which was scribed in my presence. The recorded information has been reviewed and is accurate.     Shon Baton, MD 09/06/16 [phone]

## 2016-09-06 NOTE — Discharge Instructions (Signed)
You were seen today for headache and neck pain. This may be related to muscle spasm in the right side of her neck. He will be given a muscle relaxant. Continue anti-inflammatories. If you develop any new or worsening symptoms, weakness, numbness, vision changes you should be reevaluated immediately.

## 2016-09-09 ENCOUNTER — Encounter (HOSPITAL_COMMUNITY): Payer: Self-pay | Admitting: Emergency Medicine

## 2016-09-09 ENCOUNTER — Emergency Department (HOSPITAL_COMMUNITY)
Admission: EM | Admit: 2016-09-09 | Discharge: 2016-09-10 | Disposition: A | Discharge status: Home / Self Care | Payer: 59 | Attending: Emergency Medicine | Admitting: Emergency Medicine

## 2016-09-09 DIAGNOSIS — Z79899 Other long term (current) drug therapy: Secondary | ICD-10-CM | POA: Diagnosis not present

## 2016-09-09 DIAGNOSIS — R51 Headache: Secondary | ICD-10-CM | POA: Diagnosis not present

## 2016-09-09 DIAGNOSIS — R42 Dizziness and giddiness: Secondary | ICD-10-CM | POA: Diagnosis present

## 2016-09-09 DIAGNOSIS — F17210 Nicotine dependence, cigarettes, uncomplicated: Secondary | ICD-10-CM | POA: Diagnosis not present

## 2016-09-09 DIAGNOSIS — E039 Hypothyroidism, unspecified: Secondary | ICD-10-CM | POA: Insufficient documentation

## 2016-09-09 DIAGNOSIS — R519 Headache, unspecified: Secondary | ICD-10-CM

## 2016-09-09 LAB — CBC WITH DIFFERENTIAL/PLATELET
BASOS ABS: 0.1 10*3/uL (ref 0.0–0.1)
BASOS PCT: 1 %
EOS ABS: 0.1 10*3/uL (ref 0.0–0.7)
EOS PCT: 2 %
HCT: 43 % (ref 36.0–46.0)
Hemoglobin: 14.8 g/dL (ref 12.0–15.0)
Lymphocytes Relative: 46 %
Lymphs Abs: 2.8 10*3/uL (ref 0.7–4.0)
MCH: 31.4 pg (ref 26.0–34.0)
MCHC: 34.4 g/dL (ref 30.0–36.0)
MCV: 91.3 fL (ref 78.0–100.0)
Monocytes Absolute: 0.5 10*3/uL (ref 0.1–1.0)
Monocytes Relative: 8 %
Neutro Abs: 2.6 10*3/uL (ref 1.7–7.7)
Neutrophils Relative %: 43 %
PLATELETS: 369 10*3/uL (ref 150–400)
RBC: 4.71 MIL/uL (ref 3.87–5.11)
RDW: 13.6 % (ref 11.5–15.5)
WBC: 6.1 10*3/uL (ref 4.0–10.5)

## 2016-09-09 LAB — URINALYSIS, ROUTINE W REFLEX MICROSCOPIC
Bilirubin Urine: NEGATIVE
GLUCOSE, UA: NEGATIVE mg/dL
Hgb urine dipstick: NEGATIVE
KETONES UR: NEGATIVE mg/dL
Leukocytes, UA: NEGATIVE
Nitrite: NEGATIVE
PH: 5 (ref 5.0–8.0)
Protein, ur: NEGATIVE mg/dL
SPECIFIC GRAVITY, URINE: 1.008 (ref 1.005–1.030)

## 2016-09-09 LAB — COMPREHENSIVE METABOLIC PANEL
ALBUMIN: 4.2 g/dL (ref 3.5–5.0)
ALK PHOS: 45 U/L (ref 38–126)
ALT: 13 U/L — AB (ref 14–54)
AST: 19 U/L (ref 15–41)
Anion gap: 7 (ref 5–15)
BILIRUBIN TOTAL: 0.5 mg/dL (ref 0.3–1.2)
BUN: 10 mg/dL (ref 6–20)
CALCIUM: 9.1 mg/dL (ref 8.9–10.3)
CO2: 23 mmol/L (ref 22–32)
CREATININE: 0.81 mg/dL (ref 0.44–1.00)
Chloride: 110 mmol/L (ref 101–111)
GFR calc Af Amer: >60 mL/min (ref >60)
GLUCOSE: 81 mg/dL (ref 65–99)
POTASSIUM: 4 mmol/L (ref 3.5–5.1)
Sodium: 140 mmol/L (ref 135–145)
TOTAL PROTEIN: 6.8 g/dL (ref 6.5–8.1)

## 2016-09-09 MED ORDER — LIDOCAINE 5 % EX PTCH: 1.0000 | MEDICATED_PATCH | CUTANEOUS | 0 refills | Status: DC

## 2016-09-09 MED ORDER — LIDOCAINE 5 % EX PTCH
1.0000 | MEDICATED_PATCH | CUTANEOUS | Status: DC
  Administered 2016-09-09: 1 via TRANSDERMAL
  Filled 2016-09-09: qty 1

## 2016-09-09 NOTE — Discharge Instructions (Signed)
Follow-up with your primary care doctor regarding your visit today. °

## 2016-09-09 NOTE — ED Provider Notes (Signed)
[facility]-EMERGENCY DEPT Provider Note   CSN: [id number] Arrival date & time: 09/09/16  1946   By signing my name below, I, Teofilo PodMatthew P. Jamison, attest [id number] this documentation has been prepared under the direction and in the presence of TRW AutomotiveKelly Leshea Jaggers, [place]PA-C. Electronically Signed: Teofilo PodMatthew P. Jamison, ED Scribe. 09/09/2016. 10:31 PM.   History   Chief Complaint Chief Complaint  Patient presents with  . Dizziness    The history is provided by the patient. No language interpreter was used.  HPI Comments:  Jessica Cooke is a 56 y.o. female who presents to the Emergency Department complaining of a worsening headache x 1 week. She states that the pain is "under her scalp" in the back of her head. Pt complains of associated dizziness. Pt was seen here for the same 3 days ago and had a CT of her head that was normal. Pt took Aleve at 1900 with no relief. Denies vomiting, fever, syncope.    Past Medical History:  Diagnosis Date  . Eczema   . Hypothyroid   . Thyroid disease     There are no active problems to display for this patient.   Past Surgical History:  Procedure Laterality Date  . ABDOMINAL HYSTERECTOMY      OB History    No data available       Home Medications    Prior to Admission medications   Medication Sig Start Date End Date Taking? Authorizing Provider  levothyroxine (SYNTHROID, LEVOTHROID) 100 MCG tablet Take 100 mcg by mouth daily before breakfast.   Yes [provider]  naproxen sodium (ANAPROX) 220 MG tablet Take 220 mg by mouth every 12 (twelve) hours as needed (pain).   Yes [provider]  cyclobenzaprine (FLEXERIL) 5 MG tablet Take 1 tablet (5 mg total) by mouth 2 (two) times daily as needed for muscle spasms. Patient not taking: Reported on 09/09/2016 09/06/16   Horton, Mayer Maskerourtney F, MD  lidocaine (LIDODERM) 5 % Place 1 patch onto the skin daily. Remove & Discard patch within 12 hours or as directed by MD 09/09/16   Antony MaduraHumes, Isatu Macinnes, PA-C  LORazepam  (ATIVAN) 1 MG tablet Take 1 tablet (1 mg total) by mouth every 8 (eight) hours as needed for anxiety. No driving when taking. Patient not taking: Reported on 06/07/2015 03/30/14   Cathren LaineSteinl, Kevin, MD  naproxen (NAPROSYN) 500 MG tablet Take 1 tablet (500 mg total) by mouth 2 (two) times daily. Patient not taking: Reported on 09/09/2016 09/06/16   Horton, Mayer Maskerourtney F, MD    Family History Family History  Problem Relation Age of Onset  . Hypertension Mother   . Hypertension Sister   . Hypertension Brother     Social History Social History  Substance Use Topics  . Smoking status: Current Every Day Smoker    Packs/day: 1.00    Types: Cigarettes  . Smokeless tobacco: Never Used  . Alcohol use Yes     Comment: rarely     Allergies   Dilaudid [hydromorphone hcl] and Tramadol   Review of Systems Review of Systems All systems reviewed and are negative for acute change except as noted in the HPI.   Physical Exam Updated Vital Signs BP (!) 141/88   Pulse 73   Temp 98.2 F (36.8 C) (Oral)   Resp 17   Ht 5\' 8"  (1.727 m)   Wt 95.7 kg (211 lb)   SpO2 100%   BMI 32.08 kg/m   Physical Exam  Constitutional: She is oriented to person,  place, and time. She appears well-developed and well-nourished. No distress.  Nontoxic appearing and in NAD  HENT:  Head: Normocephalic and atraumatic.  Mouth/Throat: Oropharynx is clear and moist.  No changes to scalp. Oropharynx clear.  Eyes: Conjunctivae and EOM are normal. Pupils are equal, round, and reactive to light. No scleral icterus.  Neck: Normal range of motion.  No nuchal rigidity or meningismus  Cardiovascular: Normal rate, regular rhythm and intact distal pulses.   Pulmonary/Chest: Effort normal. No respiratory distress. She has no wheezes.  Respirations even and unlabored  Abdominal: She exhibits no distension.  Musculoskeletal: Normal range of motion.  Neurological: She is alert and oriented to person, place, and time. No cranial  nerve deficit. She exhibits normal muscle tone. Coordination normal.  GCS 15. Speech is goal oriented. No cranial nerve deficits appreciated; symmetric eyebrow raise, no facial drooping, tongue midline. Patient has equal grip strength bilaterally with 5/5 strength against resistance in all major muscle groups bilaterally. Sensation to light touch intact. Patient moves extremities without ataxia.   Skin: Skin is warm and dry. No rash noted. She is not diaphoretic. No erythema. No pallor.  Psychiatric: She has a normal mood and affect. Her behavior is normal.  Nursing note and vitals reviewed.    ED Treatments / Results  DIAGNOSTIC STUDIES:  Oxygen Saturation is 98% on RA, normal by my interpretation.    COORDINATION OF CARE:  10:22 PM Discussed treatment plan with pt at bedside and pt agreed to plan.   Labs (all labs ordered are listed, but only abnormal results are displayed) Labs Reviewed  URINALYSIS, ROUTINE W REFLEX MICROSCOPIC - Abnormal; Notable for the following:       Result Value   Bacteria, UA MANY (*)    Squamous Epithelial / LPF 0-5 (*)    All other components within normal limits  COMPREHENSIVE METABOLIC PANEL - Abnormal; Notable for the following:    ALT 13 (*)    All other components within normal limits  CBC WITH DIFFERENTIAL/PLATELET   EKG  EKG Interpretation None       Radiology No results found. Ct Head Wo Contrast Result Date: 09/06/2016  CLINICAL DATA:  Initial evaluation for acute headache, neck pain, dizziness. EXAM: CT HEAD WITHOUT CONTRAST TECHNIQUE: Contiguous axial images were obtained from the base of the skull through the vertex without intravenous contrast. COMPARISON:  Prior CT from 06/07/2015. FINDINGS: Brain: Cerebral volume within normal limits for patient age. No evidence for acute intracranial hemorrhage. No findings to suggest acute large vessel territory infarct. No mass lesion, midline shift, or mass effect. Ventricles are normal in size  without evidence for hydrocephalus. No extra-axial fluid collection identified. Vascular: No hyperdense vessel identified.Scattered vascular calcifications noted within the carotid siphons. Skull: Scalp soft tissues demonstrate no acute abnormality.Calvarium intact. Sinuses/Orbits: Globes and orbital soft tissues are within normal limits. Visualized paranasal sinuses are clear. No mastoid effusion. IMPRESSION: Normal head CT.  No acute intracranial process identified. Electronically Signed   By: Rise Mu M.D.   On: 09/06/2016 05:18   Procedures Procedures (including critical care time)  Medications Ordered in ED Medications  lidocaine (LIDODERM) 5 % 1 patch (1 patch Transdermal Patch Applied 09/09/16 2251)     Initial Impression / Assessment and Plan / ED Course  I have reviewed the triage vital signs and the nursing notes.  Pertinent labs & imaging results that were available during my care of the patient were reviewed by me and considered in my medical decision  making (see chart for details).     56 year old female percent to the emergency department for evaluation of persistent headache and dizziness. She was evaluated on 09/06/2016 for similar symptoms. She has also seen her primary care doctor as her symptoms have been persisting over the past week. She has a nonfocal neurologic exam today. I have reviewed her prior CT scan which is reassuring. I do not believe that further emergent imaging is indicated.  The patient has been largely noncompliant with the medications prescribed by her primary care doctor for management of her symptoms. I have encouraged her to try taking gabapentin as prescribed for her pain. Low suspicion for emergent etiology of symptoms. Patient to be discharged with instructions for supportive care. Return precautions discussed and provided. Patient discharged in stable condition with no unaddressed concerns.   Final Clinical Impressions(s) / ED Diagnoses     Final diagnoses:  Nonintractable headache, unspecified chronicity pattern, unspecified headache type    New Prescriptions Discharge Medication List as of 09/09/2016 11:50 PM    START taking these medications   Details  lidocaine (LIDODERM) 5 % Place 1 patch onto the skin daily. Remove & Discard patch within 12 hours or as directed by MD, Starting Fri 09/09/2016, Print        I personally performed the services described in this documentation, which was scribed in my presence. The recorded information has been reviewed and is accurate.       Antony Madura, PA-C 09/10/16 0236    Doug Sou, MD 09/11/16 [phone]

## 2016-09-09 NOTE — ED Triage Notes (Signed)
Patient is complaining of a headache and that something is crawling under her skin. Patient is also dizzy when she woke up today.

## 2017-09-12 ENCOUNTER — Emergency Department (HOSPITAL_COMMUNITY)
Admission: EM | Admit: 2017-09-12 | Discharge: 2017-09-13 | Disposition: A | Discharge status: Home / Self Care | Payer: BLUE CROSS/BLUE SHIELD | Attending: Emergency Medicine | Admitting: Emergency Medicine

## 2017-09-12 ENCOUNTER — Emergency Department (HOSPITAL_COMMUNITY): Payer: BLUE CROSS/BLUE SHIELD

## 2017-09-12 ENCOUNTER — Encounter (HOSPITAL_COMMUNITY): Payer: Self-pay

## 2017-09-12 ENCOUNTER — Other Ambulatory Visit: Payer: Self-pay

## 2017-09-12 DIAGNOSIS — Z5321 Procedure and treatment not carried out due to patient leaving prior to being seen by health care provider: Secondary | ICD-10-CM | POA: Diagnosis not present

## 2017-09-12 DIAGNOSIS — M79674 Pain in right toe(s): Secondary | ICD-10-CM | POA: Diagnosis present

## 2017-09-12 LAB — CBC WITH DIFFERENTIAL/PLATELET
Abs Immature Granulocytes: 0 10*3/uL (ref 0.0–0.1)
BASOS ABS: 0.1 10*3/uL (ref 0.0–0.1)
Basophils Relative: 1 %
EOS PCT: 2 %
Eosinophils Absolute: 0.2 10*3/uL (ref 0.0–0.7)
HEMATOCRIT: 46.5 % — AB (ref 36.0–46.0)
HEMOGLOBIN: 15.6 g/dL — AB (ref 12.0–15.0)
Immature Granulocytes: 0 %
LYMPHS ABS: 3.2 10*3/uL (ref 0.7–4.0)
LYMPHS PCT: 43 %
MCH: 30.7 pg (ref 26.0–34.0)
MCHC: 33.5 g/dL (ref 30.0–36.0)
MCV: 91.5 fL (ref 78.0–100.0)
Monocytes Absolute: 0.4 10*3/uL (ref 0.1–1.0)
Monocytes Relative: 6 %
Neutro Abs: 3.5 10*3/uL (ref 1.7–7.7)
Neutrophils Relative %: 48 %
Platelets: 440 10*3/uL — ABNORMAL HIGH (ref 150–400)
RBC: 5.08 MIL/uL (ref 3.87–5.11)
RDW: 13.9 % (ref 11.5–15.5)
WBC: 7.3 10*3/uL (ref 4.0–10.5)

## 2017-09-12 LAB — COMPREHENSIVE METABOLIC PANEL
ALBUMIN: 4.3 g/dL (ref 3.5–5.0)
ALT: 12 U/L — AB (ref 14–54)
AST: 19 U/L (ref 15–41)
Alkaline Phosphatase: 50 U/L (ref 38–126)
Anion gap: 10 (ref 5–15)
BILIRUBIN TOTAL: 0.5 mg/dL (ref 0.3–1.2)
BUN: 9 mg/dL (ref 6–20)
CO2: 22 mmol/L (ref 22–32)
CREATININE: 0.93 mg/dL (ref 0.44–1.00)
Calcium: 9.5 mg/dL (ref 8.9–10.3)
Chloride: 111 mmol/L (ref 101–111)
GFR calc Af Amer: >60 mL/min (ref >60)
GFR calc non Af Amer: >60 mL/min (ref >60)
GLUCOSE: 116 mg/dL — AB (ref 65–99)
POTASSIUM: 3.7 mmol/L (ref 3.5–5.1)
Sodium: 143 mmol/L (ref 135–145)
TOTAL PROTEIN: 6.7 g/dL (ref 6.5–8.1)

## 2017-09-12 LAB — I-STAT CG4 LACTIC ACID, ED: Lactic Acid, Venous: 1.16 mmol/L (ref 0.5–1.9)

## 2017-09-12 IMAGING — DX DG FOOT COMPLETE 3+V*L*
3 series · 3 of 3 positions shown · non-contrast
Comparison: None.

CLINICAL DATA: Pain in left great toe.  Swelling.  Ingrown nail.

EXAM:
LEFT FOOT - COMPLETE 3+ VIEW

[foot ap]
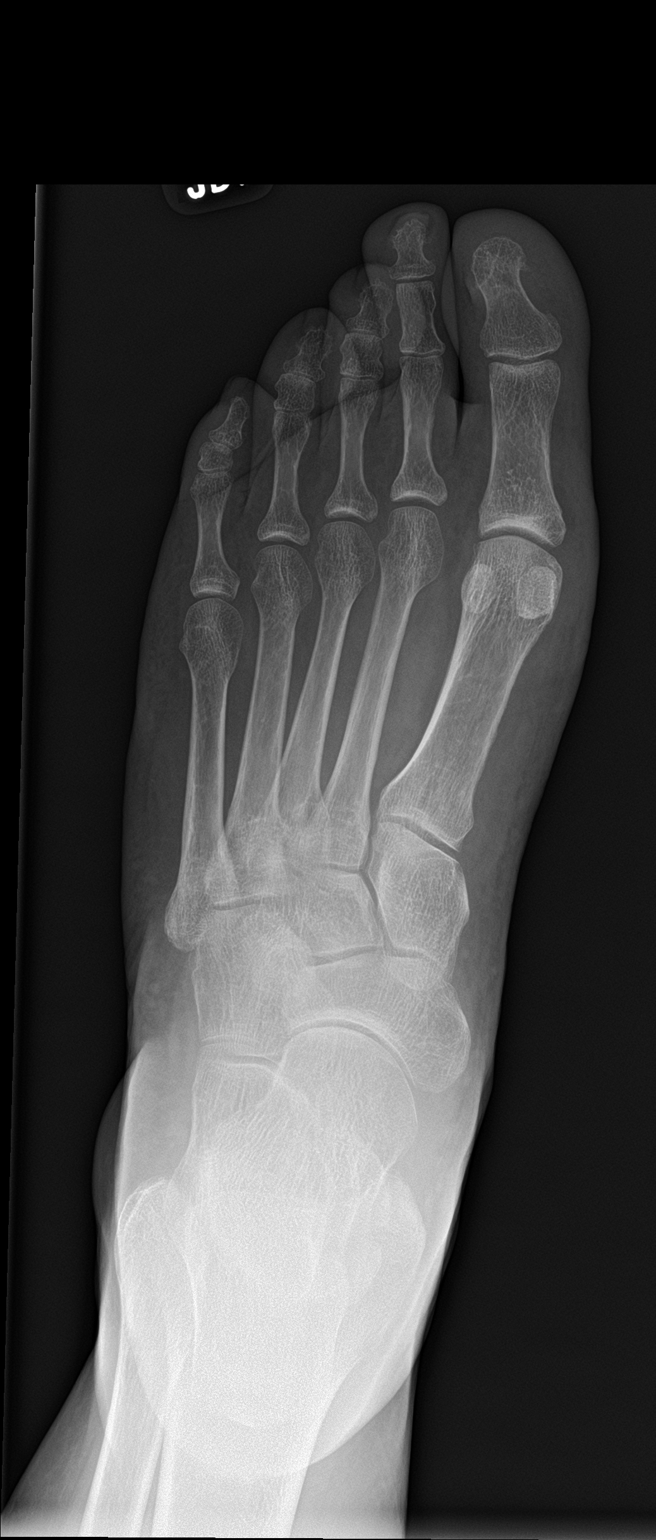

[foot obl]
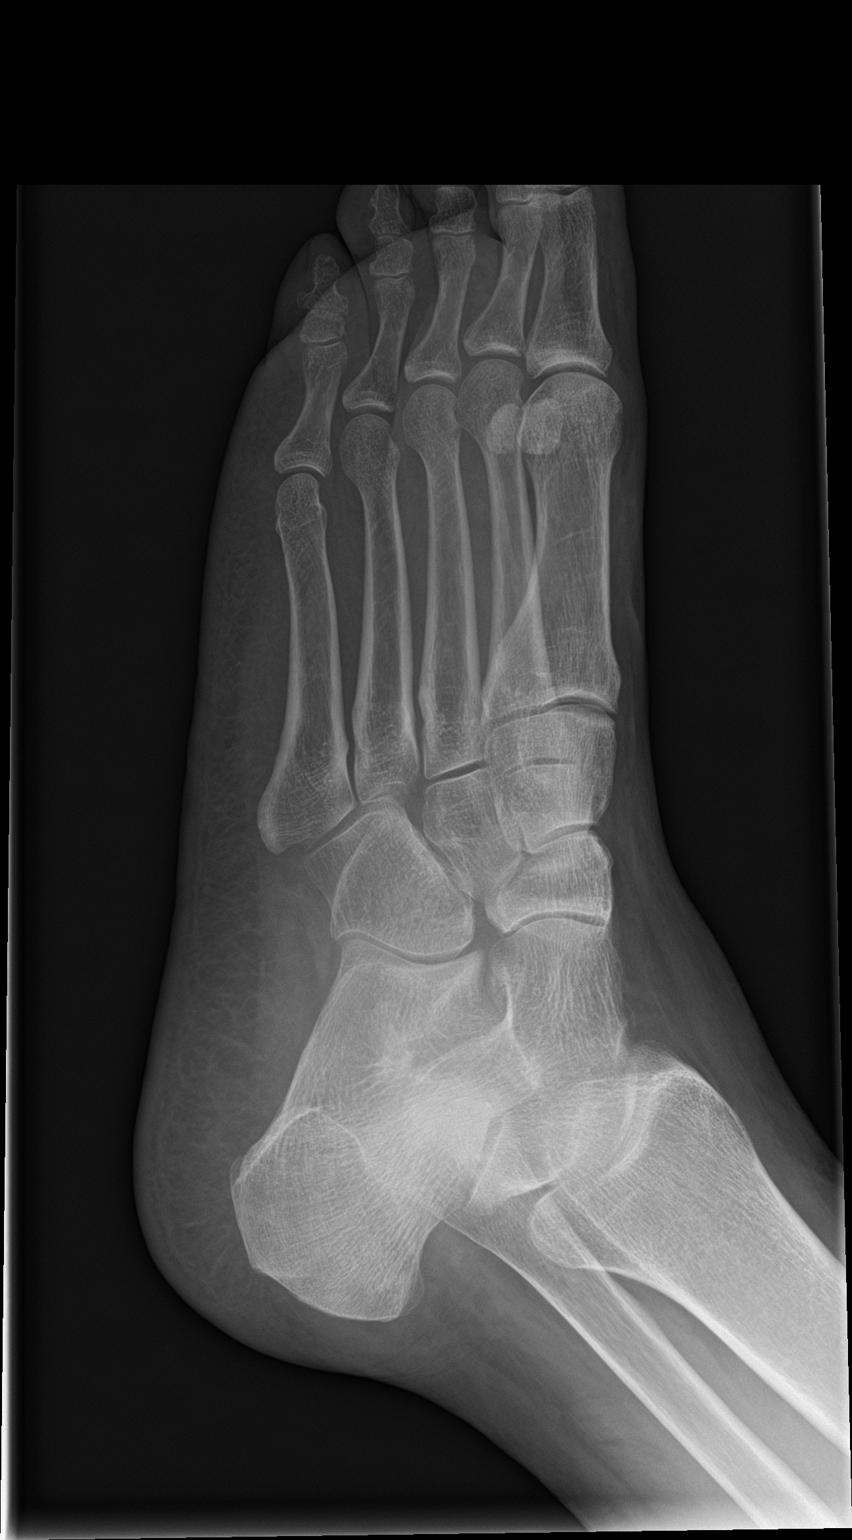

[foot lat]
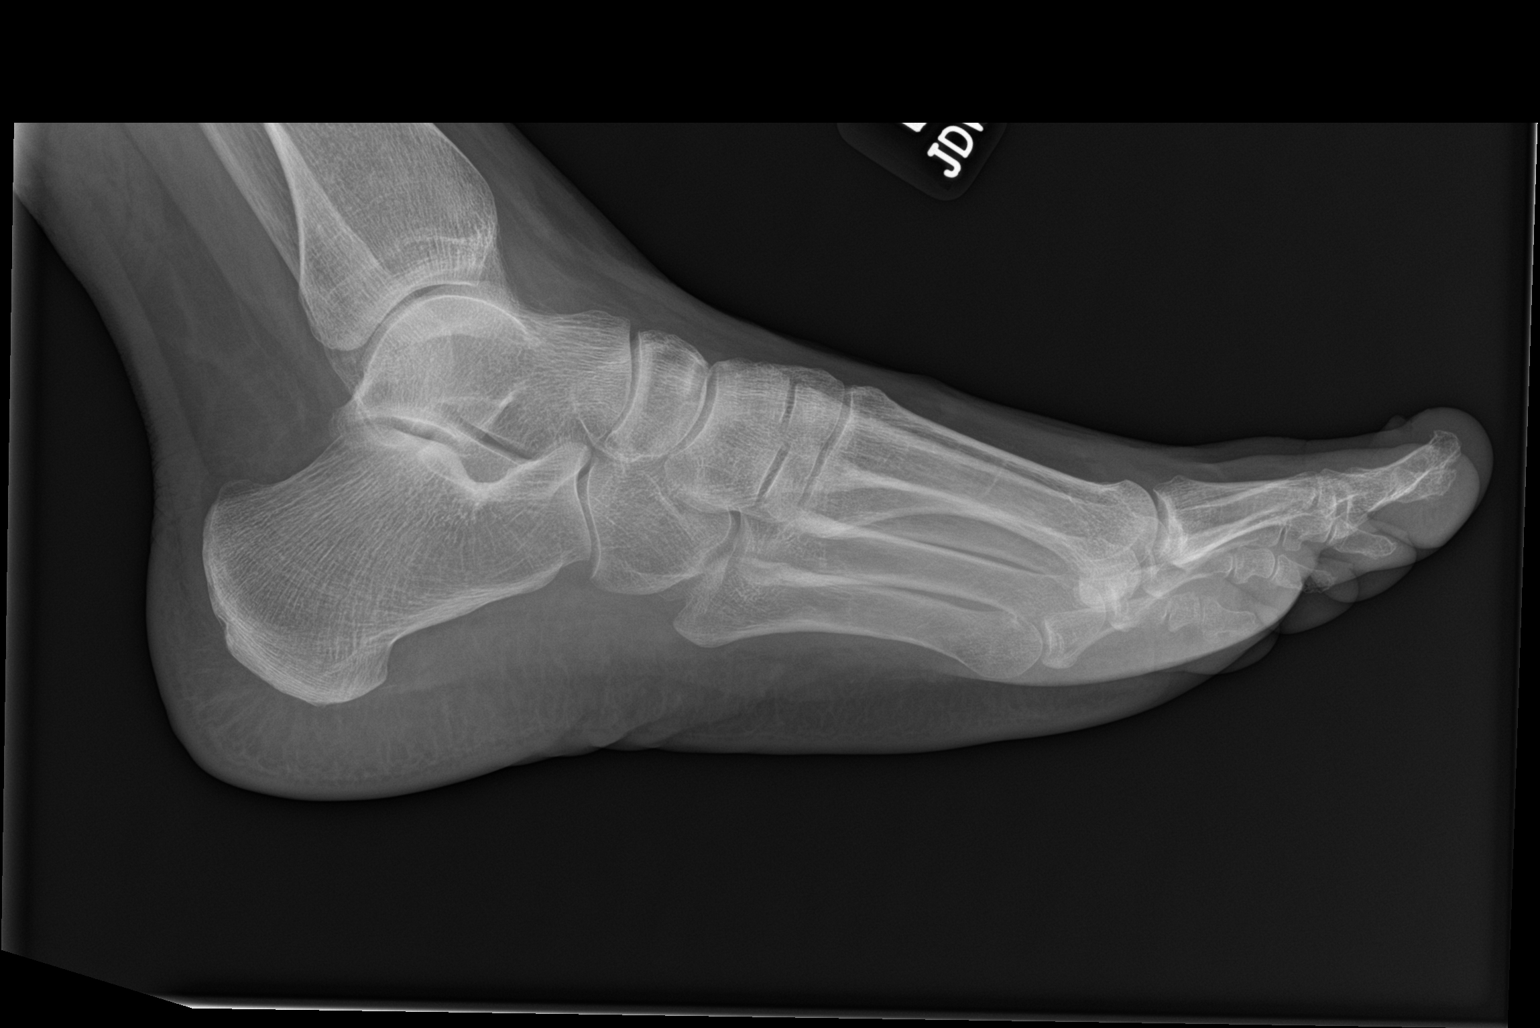

[3 of 3 positions shown; findings below may reference images not displayed]

FINDINGS: Soft tissue swelling in the great toe consistent with history. No
bony erosion. No fractures identified.
IMPRESSION: Soft tissue swelling in the great toe consistent with history. No
bony erosion or fracture.

## 2017-09-12 NOTE — ED Triage Notes (Signed)
Pt states that she has pain in right great toe. Has apt with podiatrist but reports she can not wait r/t pain. Big toe is swollen, toenail appears to be ingrown. Pt states that she has pulled some toenail out.

## 2017-09-13 ENCOUNTER — Encounter: Payer: Self-pay | Admitting: Podiatry

## 2017-09-13 ENCOUNTER — Ambulatory Visit: Payer: BLUE CROSS/BLUE SHIELD | Admitting: Podiatry

## 2017-09-13 VITALS — BP 135/88 | HR 98 | Resp 16

## 2017-09-13 DIAGNOSIS — L03032 Cellulitis of left toe: Secondary | ICD-10-CM | POA: Diagnosis not present

## 2017-09-13 MED ORDER — IBUPROFEN 400 MG PO TABS
400.0000 mg | ORAL_TABLET | Freq: Once | ORAL | Status: AC | PRN
  Administered 2017-09-13: 400 mg via ORAL
  Filled 2017-09-13: qty 1

## 2017-09-13 MED ORDER — CEPHALEXIN 500 MG PO CAPS: 500.0000 mg | ORAL_CAPSULE | Freq: Three times a day (TID) | ORAL | 1 refills | Status: DC

## 2017-09-13 NOTE — Patient Instructions (Signed)

## 2017-09-13 NOTE — ED Notes (Signed)
Patient very upset with the wait, spoke with nurse first and charge nurse  States she is leaving.

## 2017-09-13 NOTE — Progress Notes (Signed)
   Subjective:    Patient ID: Jessica Cooke, female    DOB: 1961/03/15, 57 y.o.   MRN: [id number]  HPI    Review of Systems  All other systems reviewed and are negative.      Objective:   Physical Exam        Assessment & Plan:

## 2017-09-13 NOTE — Progress Notes (Signed)
Subjective:   Patient ID: Jessica Cooke, female   DOB: 57 y.o.   MRN: [id number]   HPI Patient presents stating she is had a very painful ingrown toenail of her left big toe and she went to the emergency room had an x-ray but they did not do anything to help her and it is been very tender and making it hard to wear shoe gear comfortably.  Patient is an occasional smoker and does like to be active   Review of Systems  All other systems reviewed and are negative.       Objective:  Physical Exam  Constitutional: She appears well-developed and well-nourished.  Cardiovascular: Intact distal pulses.  Pulmonary/Chest: Effort normal.  Musculoskeletal: Normal range of motion.  Neurological: She is alert.  Skin: Skin is warm.  Nursing note and vitals reviewed.   Neurovascular status found to be intact with muscle strength adequate range of motion within normal limits.  Patient has an inflamed medial border of the left hallux is localized to this area with no proximal edema erythema or drainage currently noted but is very painful when palpated.  She has good digital perfusion well oriented x3     Assessment:  Paronychia infection of the left hallux medial border     Plan:  H&P condition reviewed and recommended excision of the corner removal of necrotic tissue proud flesh and antibiotics.  I infiltrated the left hallux 60 mg like Marcaine mixture with sterile instrumentation after sterile prep I did remove the medial border I found her to be a small amount of localized drainage which I flushed out and I did not see anything proximal.  I applied sterile dressing placed patient on cephalexin 500 mg 3 times daily and patient will be seen back for recheck and is instructed to call if any issues should occur and any systemic signs of infection were to occur.  [facility] and do not see signs of ostial lysis currently

## 2017-09-13 NOTE — ED Notes (Signed)
This RN went to speak with pt in waiting room per request of pt and NF. Attempted to explain delay to pt, pt requesting her results from today and informed her I was unable to provide results since she has not been seen by a provider, they could be obtained through medical records. Pt states "well what do you think I should do???" I informed pt that this is an Emergency Dept where we make sure pts are not having emergencies and that her results have been reviewed by a staff member. Patient experience number provided to pt and family.

## 2018-03-31 ENCOUNTER — Emergency Department (HOSPITAL_COMMUNITY): Payer: No Typology Code available for payment source

## 2018-03-31 ENCOUNTER — Emergency Department (HOSPITAL_COMMUNITY)
Admission: EM | Admit: 2018-03-31 | Discharge: 2018-03-31 | Disposition: A | Discharge status: Home / Self Care | Payer: No Typology Code available for payment source | Attending: Emergency Medicine | Admitting: Emergency Medicine

## 2018-03-31 ENCOUNTER — Other Ambulatory Visit: Payer: Self-pay

## 2018-03-31 DIAGNOSIS — Y990 Civilian activity done for income or pay: Secondary | ICD-10-CM | POA: Diagnosis not present

## 2018-03-31 DIAGNOSIS — E039 Hypothyroidism, unspecified: Secondary | ICD-10-CM | POA: Insufficient documentation

## 2018-03-31 DIAGNOSIS — M25562 Pain in left knee: Secondary | ICD-10-CM | POA: Diagnosis present

## 2018-03-31 DIAGNOSIS — F17210 Nicotine dependence, cigarettes, uncomplicated: Secondary | ICD-10-CM | POA: Diagnosis not present

## 2018-03-31 DIAGNOSIS — Y93F9 Activity, other caregiving: Secondary | ICD-10-CM | POA: Diagnosis not present

## 2018-03-31 DIAGNOSIS — Y9289 Other specified places as the place of occurrence of the external cause: Secondary | ICD-10-CM | POA: Insufficient documentation

## 2018-03-31 DIAGNOSIS — X500XXA Overexertion from strenuous movement or load, initial encounter: Secondary | ICD-10-CM | POA: Insufficient documentation

## 2018-03-31 DIAGNOSIS — Z79899 Other long term (current) drug therapy: Secondary | ICD-10-CM | POA: Insufficient documentation

## 2018-03-31 IMAGING — DX DG KNEE COMPLETE 4+V*L*
4 series · 4 of 4 positions shown · non-contrast
Comparison: [DATE]

CLINICAL DATA: Left knee injury.  Pain.

EXAM:
LEFT KNEE - COMPLETE 4+ VIEW

[knee ap]
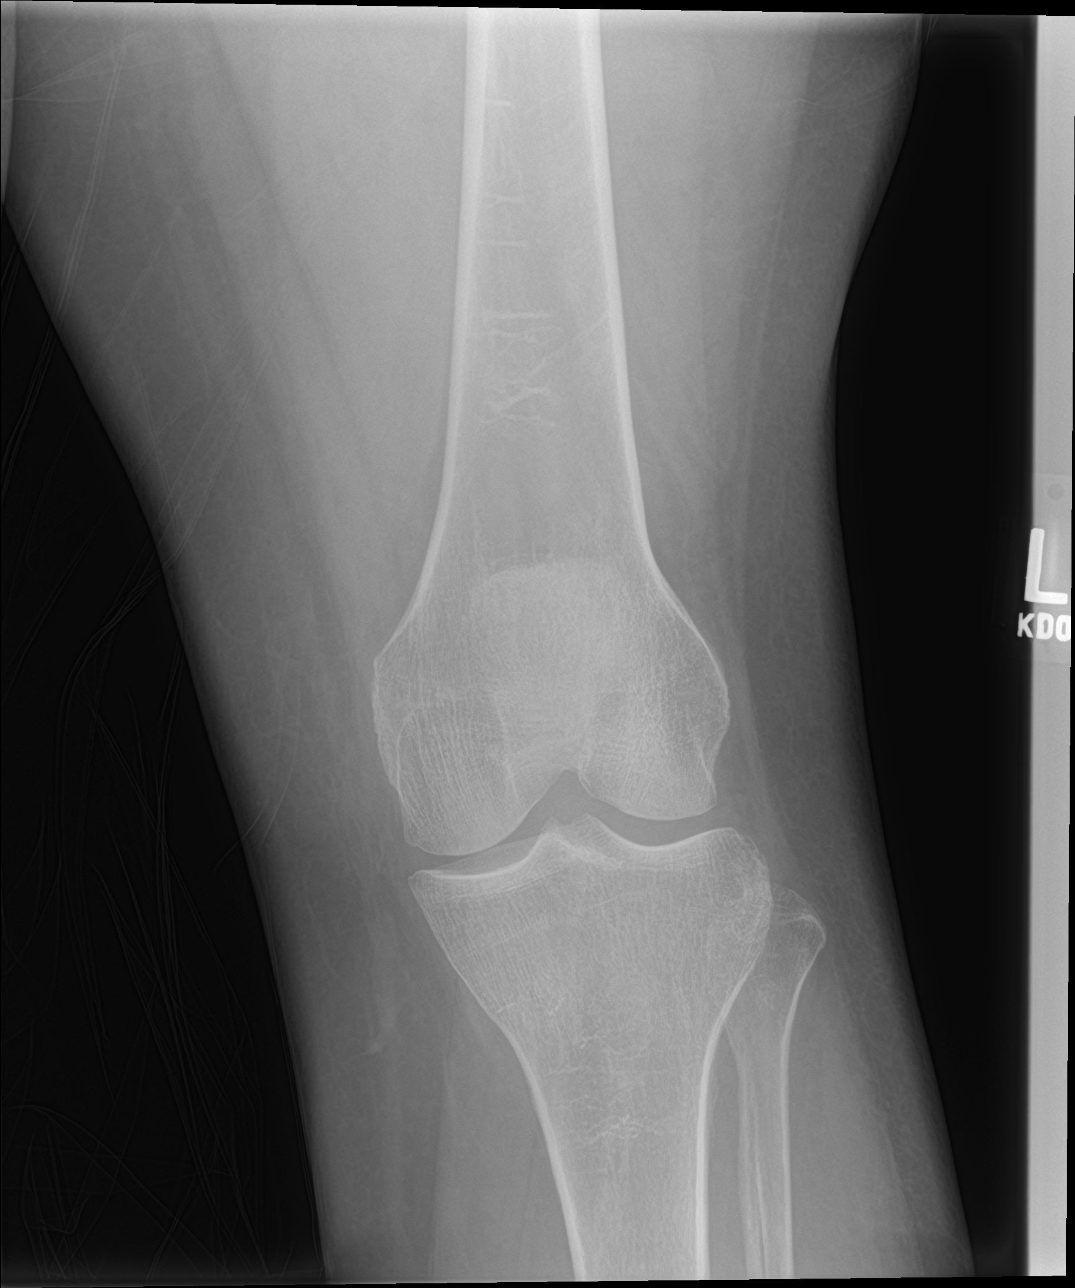

[knee lat]
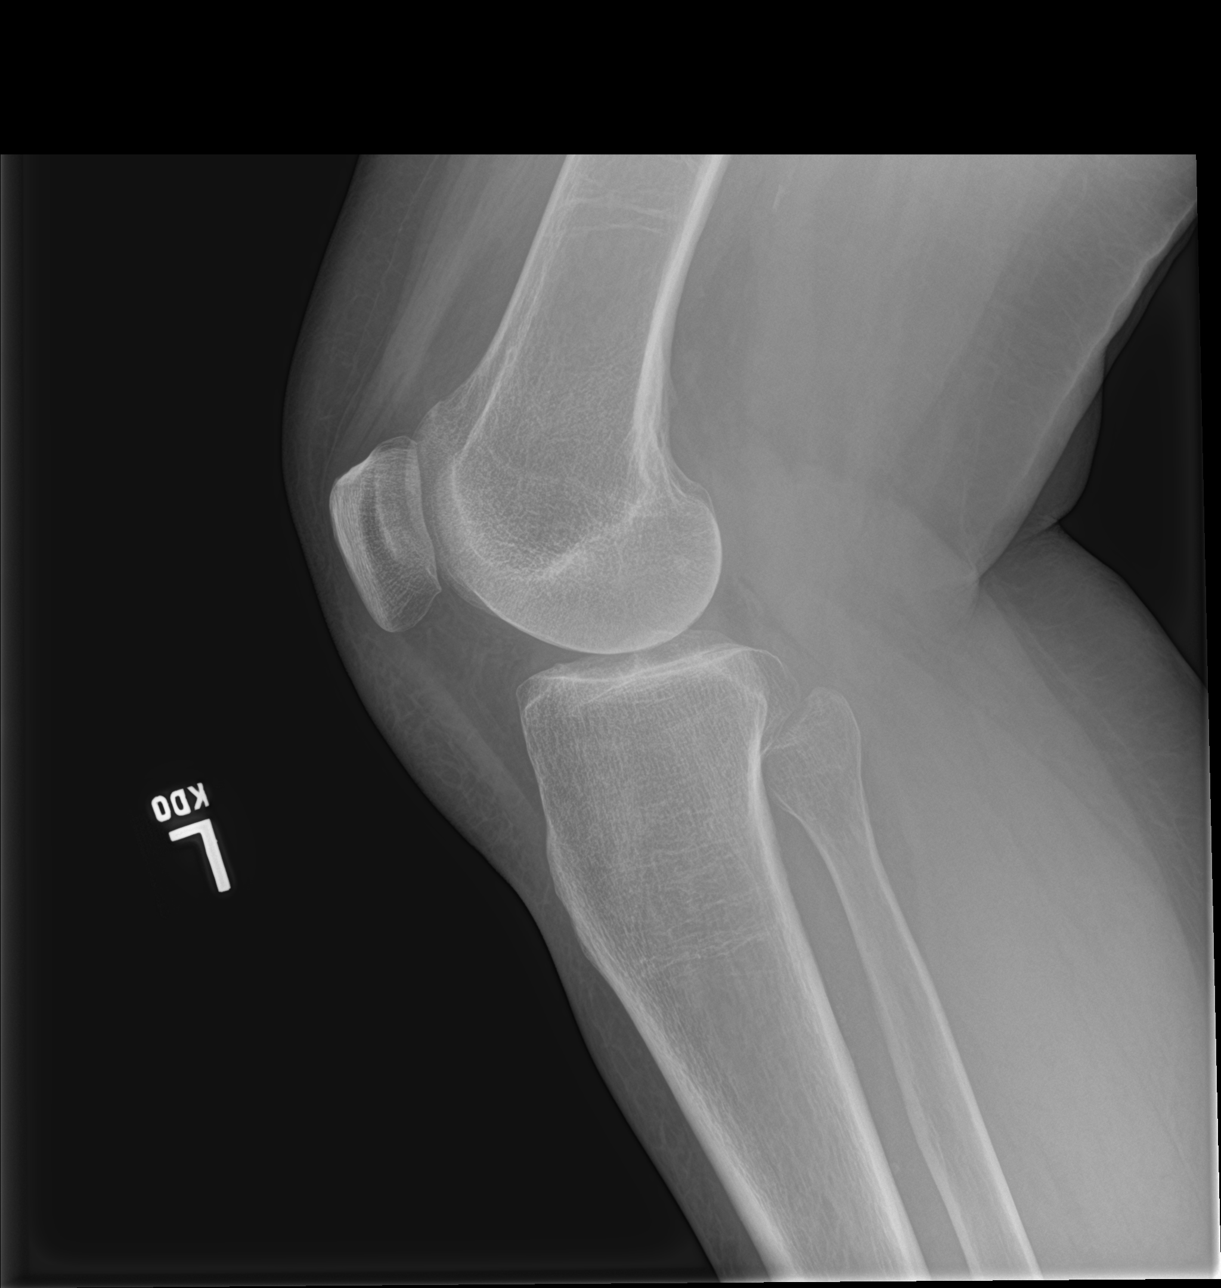

[knee obl (1 of 2)]
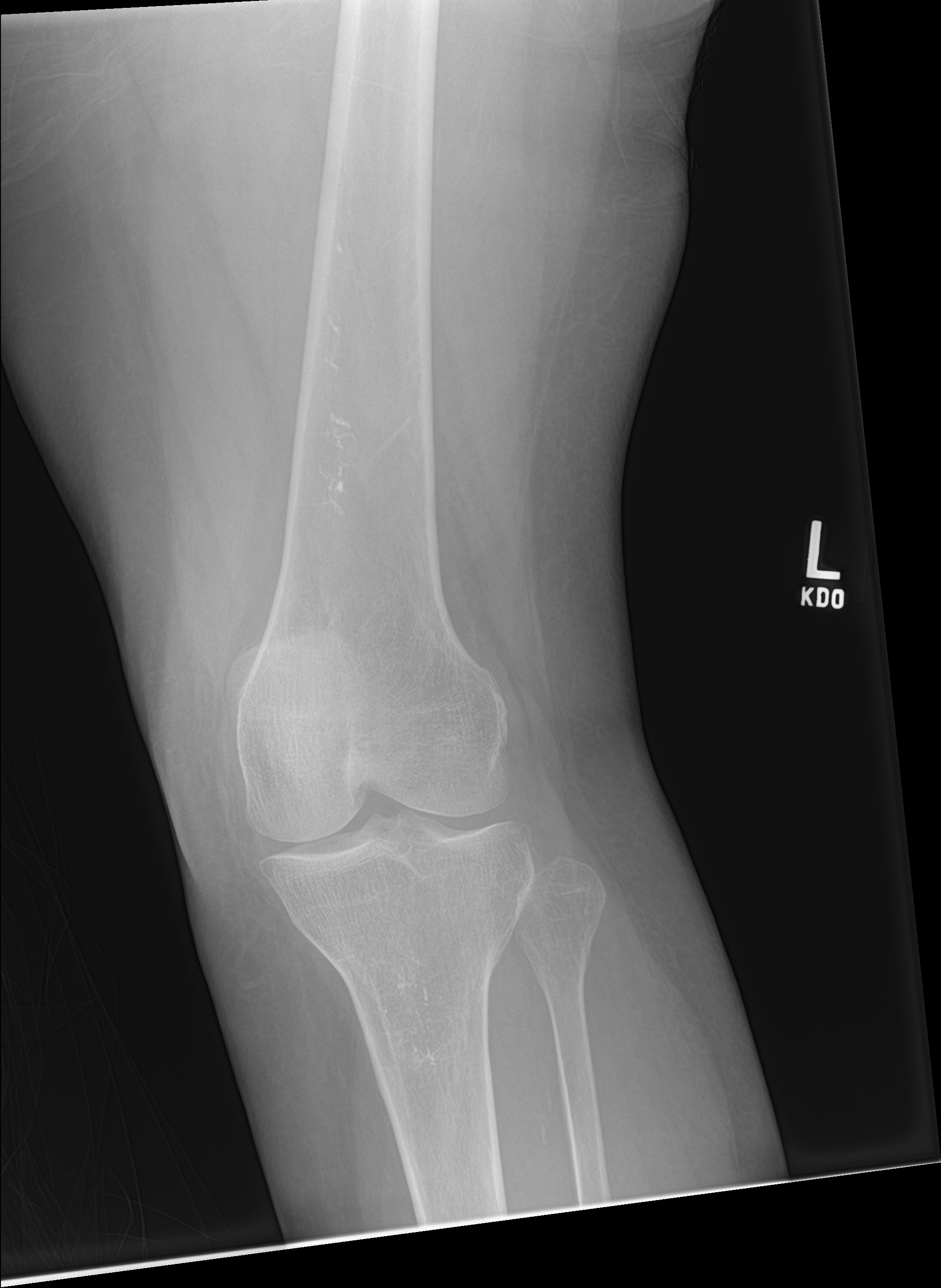

[knee obl (2 of 2)]
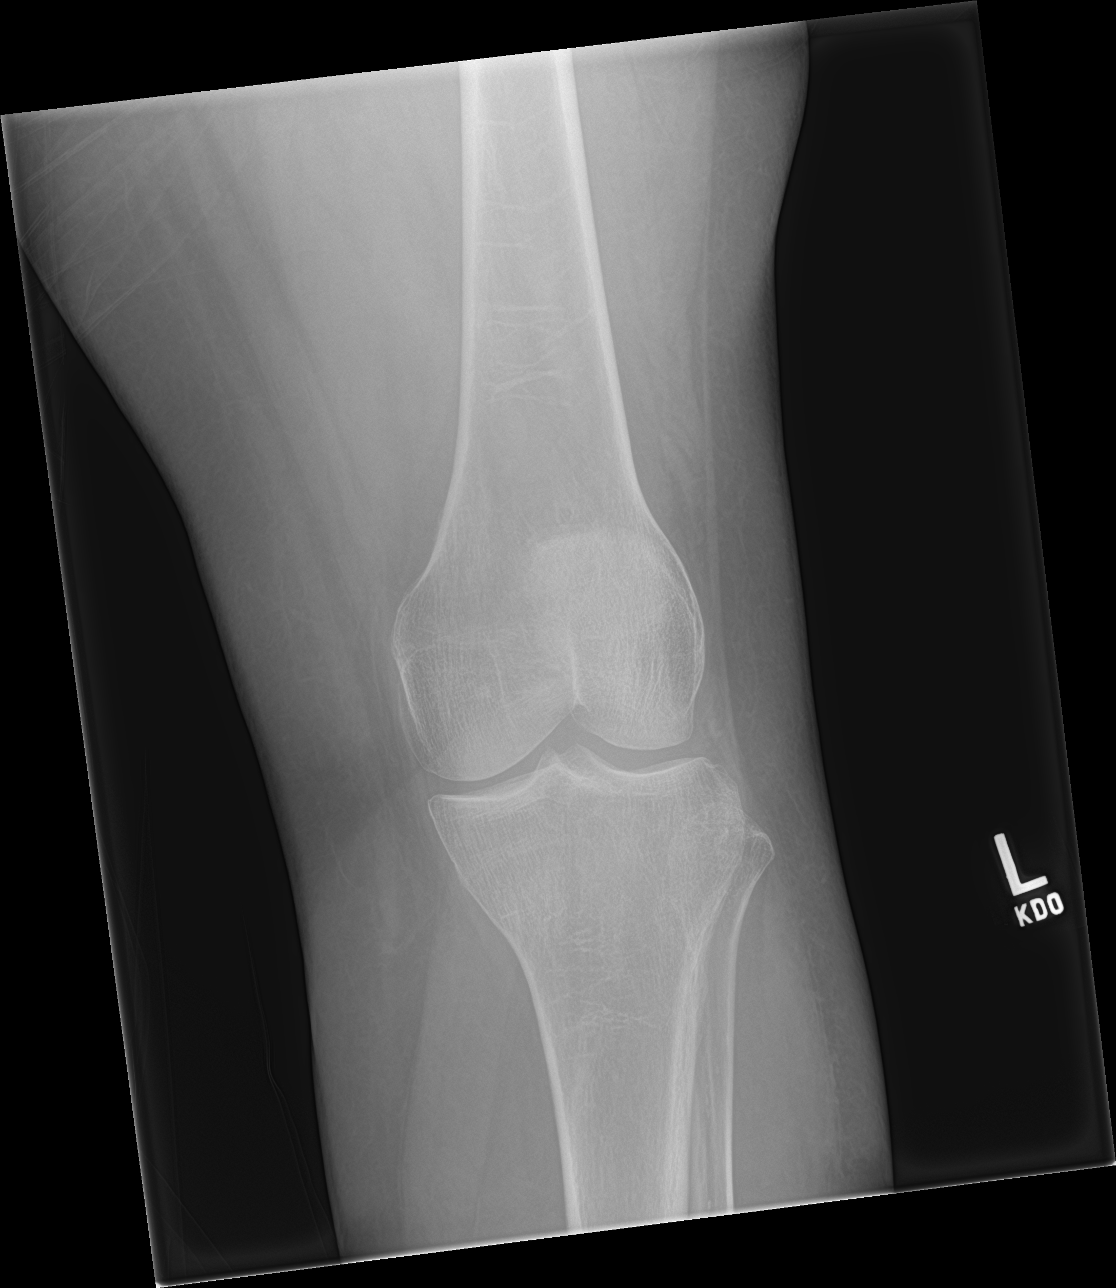

[4 of 4 positions shown; findings below may reference images not displayed]

FINDINGS: Early joint space narrowing and spurring in the patellofemoral
compartment. No acute bony abnormality. Specifically, no fracture,
subluxation, or dislocation. No joint effusion.
IMPRESSION: No acute bony abnormality.

## 2018-03-31 MED ORDER — HYDROCODONE-ACETAMINOPHEN 5-325 MG PO TABS
1.0000 | ORAL_TABLET | Freq: Once | ORAL | Status: AC
  Administered 2018-03-31: 1 via ORAL
  Filled 2018-03-31: qty 1

## 2018-03-31 NOTE — ED Notes (Signed)
Discharge instructions reviewed with patient. All questions answered. Pt wheeled to vehicle with belongings

## 2018-03-31 NOTE — Discharge Instructions (Addendum)
Please take Tylenol (acetaminophen) to relieve your pain.  You may take tylenol, up to 1,000 mg (two extra strength pills).  Do not take more than 3,000 mg tylenol in a 24 hour period.  Please check all medication labels as many medications such as pain and cold medications may contain tylenol. Please do not drink alcohol while taking this medication.   Today you received medications that may make you sleepy or impair your ability to make decisions.  For the next 24 hours please do not drive, operate heavy machinery, care for a small child with out another adult present, or perform any activities that may cause harm to you or someone else if you were to fall asleep or be impaired.   Please do not put weight on your left leg with out the knee brace on.

## 2018-03-31 NOTE — ED Provider Notes (Signed)
[facility] EMERGENCY DEPARTMENT Provider Note   CSN: [id number] Arrival date & time: 03/31/18  2020     History   Chief Complaint Chief Complaint  Patient presents with  . Knee Pain    HPI Jessica Cooke is a 57 y.o. female who presents today for evaluation of left knee pain.  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  She denies any fall.  No other injuries.    HPI  Past Medical History:  Diagnosis Date  . Eczema   . Hypothyroid   . Thyroid disease     There are no active problems to display for this patient.   Past Surgical History:  Procedure Laterality Date  . ABDOMINAL HYSTERECTOMY       OB History   No obstetric history on file.      Home Medications    Prior to Admission medications   Medication Sig Start Date End Date Taking? Authorizing Provider  azithromycin (ZITHROMAX) 250 MG tablet Take 250-500 mg by mouth daily. Take 2 tablets by mouth on day 1, take 1 tablet by mouth for days 2-5 03/30/18  Yes [provider]  buPROPion (WELLBUTRIN) 100 MG tablet Take 100 mg by mouth daily.   Yes [provider]  levothyroxine (SYNTHROID, LEVOTHROID) 100 MCG tablet Take 100 mcg by mouth daily before breakfast.   Yes [provider]  LORazepam (ATIVAN) 0.5 MG tablet Take 0.5 mg by mouth daily as needed for anxiety.  08/29/17  Yes [provider]  meloxicam (MOBIC) 7.5 MG tablet Take 7.5 mg by mouth daily.   Yes [provider]  zolpidem (AMBIEN) 5 MG tablet Take 5 mg by mouth at bedtime as needed for sleep.  08/31/17  Yes [provider]  cephALEXin (KEFLEX) 500 MG capsule Take 1 capsule (500 mg total) by mouth 3 (three) times daily. Patient not taking: Reported on 03/31/2018 09/13/17   Lenn Sink, DPM  cyclobenzaprine (FLEXERIL) 5 MG tablet Take 1 tablet (5 mg total) by mouth 2 (two) times daily as  needed for muscle spasms. Patient not taking: Reported on 03/31/2018 09/06/16   Horton, Mayer Masker, MD  lidocaine (LIDODERM) 5 % Place 1 patch onto the skin daily. Remove & Discard patch within 12 hours or as directed by MD Patient not taking: Reported on 03/31/2018 09/09/16   Antony Madura, PA-C  LORazepam (ATIVAN) 1 MG tablet Take 1 tablet (1 mg total) by mouth every 8 (eight) hours as needed for anxiety. No driving when taking. Patient not taking: Reported on 03/31/2018 03/30/14   Cathren Laine, MD  naproxen (NAPROSYN) 500 MG tablet Take 1 tablet (500 mg total) by mouth 2 (two) times daily. Patient not taking: Reported on 03/31/2018 09/06/16   Horton, Mayer Masker, MD    Family History Family History  Problem Relation Age of Onset  . Hypertension Mother   . Hypertension Sister   . Hypertension Brother     Social History Social History   Tobacco Use  . Smoking status: Current Every Day Smoker    Packs/day: 1.00    Types: Cigarettes  . Smokeless tobacco: Never Used  Substance Use Topics  . Alcohol use: Yes    Comment: rarely  . Drug use: No     Allergies   Dilaudid [hydromorphone hcl] and Tramadol   Review of Systems Review of Systems  Constitutional: Negative for chills and fever.  Musculoskeletal:       Pain in her knee  All other systems reviewed and are negative.    Physical Exam Updated Vital Signs BP 130/70 (BP Location: Right Arm)   Pulse 82   Temp 97.7 F (36.5 C) (Oral)   Resp 16   Ht 5\' 9"  (1.753 m)   Wt 99.8 kg   SpO2 100%   BMI 32.49 kg/m   Physical Exam Vitals signs and nursing note reviewed.  Constitutional:      General: She is not in acute distress.    Appearance: She is not ill-appearing.  HENT:     Head: Normocephalic.  Cardiovascular:     Rate and Rhythm: Normal rate.     Comments: Left foot 2+ DP/PT pulse. Musculoskeletal:     Comments: LLE: Left knee is generally tender to palpation, primarily on the medial aspect.  Patient is able  to lift her leg and straighten the leg.  knee is grossly stable to anterior/posterior drawer test, and valgus/varus stress.  No deformities or crepitus palpated.  Skin:    Comments: No obvious wounds to left knee.  Neurological:     Mental Status: She is alert.     Comments: Sensation intact to left foot.      ED Treatments / Results  Labs (all labs ordered are listed, but only abnormal results are displayed) Labs Reviewed - No data to display  EKG None  Radiology Dg Knee Complete 4 Views Left  Result Date: 03/31/2018 CLINICAL DATA:  Left knee injury.  Pain. EXAM: LEFT KNEE - COMPLETE 4+ VIEW COMPARISON:  04/12/2015 FINDINGS: Early joint space narrowing and spurring in the patellofemoral compartment. No acute bony abnormality. Specifically, no fracture, subluxation, or dislocation. No joint effusion. IMPRESSION: No acute bony abnormality. Electronically Signed   By: Charlett Nose M.D.   On: 03/31/2018 21:16    Procedures Procedures (including critical care time)  Medications Ordered in ED Medications  HYDROcodone-acetaminophen (NORCO/VICODIN) 5-325 MG per tablet 1 tablet (1 tablet Oral Given 03/31/18 2038)     Initial Impression / Assessment and Plan / ED Course  I have reviewed the triage vital signs and the nursing notes.  Pertinent labs & imaging results that were available during my care of the patient were reviewed by me and considered in my medical decision making (see chart for details).  Clinical Course as of Mar 31 2317  Sat Mar 31, 2018  2034 Patient reports that she tolerates Vicodin without difficulty.  Her reactions to Dilaudid and tramadol are both nausea and vomiting.   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  The affected knee is tender on the medial aspect.  X-rays were obtained with out acute  abnormality. The skin is intact to ankle/foot.  The foot is warm and well perfused with intact sensation.  Motor function is limited secondary to pain.  History and physical exam are not consistent with septic arthritis.  patient given instructions for OTC pain medication, knee immobilizer.  She was offered crutches, or a walker however she declined.  She is given a work note to perform seated work for 1 week with instructions to follow-up with her requested choice of [place] [facility]..  Patient was given the option to ask questions, all of which were answered  to the best of my ability.  Patient is agreeable for discharge.     Final Clinical Impressions(s) / ED Diagnoses   Final diagnoses:  Acute pain of left knee    ED Discharge Orders    None       Norman ClayHammond, Abdirizak Richison W, PA-C 03/31/18 2322    Cathren LaineSteinl, Kevin, MD 03/31/18 [phone]

## 2018-03-31 NOTE — ED Triage Notes (Signed)
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 Having pain from knee down to ankle

## 2018-04-04 ENCOUNTER — Ambulatory Visit (HOSPITAL_COMMUNITY)
Admission: EM | Admit: 2018-04-04 | Discharge: 2018-04-04 | Disposition: A | Discharge status: Home / Self Care | Payer: Worker's Compensation | Attending: Family Medicine | Admitting: Family Medicine

## 2018-04-04 ENCOUNTER — Encounter (HOSPITAL_COMMUNITY): Payer: Self-pay

## 2018-04-04 DIAGNOSIS — S80912A Unspecified superficial injury of left knee, initial encounter: Secondary | ICD-10-CM | POA: Diagnosis not present

## 2018-04-04 DIAGNOSIS — M25562 Pain in left knee: Secondary | ICD-10-CM | POA: Insufficient documentation

## 2018-04-04 NOTE — ED Provider Notes (Signed)
[facility]    CSN: [id number] Arrival date & time: 04/04/18  1627     History   Chief Complaint Chief Complaint  Patient presents with  . Knee Pain    HPI Jessica Cooke is a 57 y.o. female history of eczema, hypothyroid presenting today for evaluation of left knee injury.  Patient was working on Saturday, approximately 4 to 5 days ago.  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  She remembered having a twisting sensation in her knee and having immediate pain.  She denies falling or landing on her knee.  Pain worsened throughout the rest of her shift.  She was seen in the emergency room later that evening.  X-rays were negative for bony abnormality.  She was given light duty for the next week.  Patient is presenting today as she has had improvement meant in her pain and is requesting to go back to work tomorrow without restrictions.  She states at nighttime she has an occasional numbness and tingling in her left foot, but the pain is improved significantly.  She has been resting.  She has been taking Mobic daily.  Denies sensations of popping or instability.  HPI  Past Medical History:  Diagnosis Date  . Eczema   . Hypothyroid   . Thyroid disease     There are no active problems to display for this patient.   Past Surgical History:  Procedure Laterality Date  . ABDOMINAL HYSTERECTOMY      OB History   No obstetric history on file.      Home Medications    Prior to Admission medications   Medication Sig Start Date End Date Taking? Authorizing Provider  buPROPion (WELLBUTRIN) 100 MG tablet Take 100 mg by mouth daily.    [provider]  levothyroxine (SYNTHROID, LEVOTHROID) 100 MCG tablet Take 100 mcg by mouth daily before breakfast.    [provider]  LORazepam (ATIVAN) 0.5 MG tablet Take 0.5 mg by mouth daily as needed for anxiety.  08/29/17   [provider]    meloxicam (MOBIC) 7.5 MG tablet Take 7.5 mg by mouth daily.    [provider]  zolpidem (AMBIEN) 5 MG tablet Take 5 mg by mouth at bedtime as needed for sleep.  08/31/17   [provider]    Family History Family History  Problem Relation Age of Onset  . Hypertension Mother   . Hypertension Sister   . Hypertension Brother     Social History Social History   Tobacco Use  . Smoking status: Current Every Day Smoker    Packs/day: 1.00    Types: Cigarettes  . Smokeless tobacco: Never Used  Substance Use Topics  . Alcohol use: Yes    Comment: rarely  . Drug use: No     Allergies   Dilaudid [hydromorphone hcl] and Tramadol   Review of Systems Review of Systems  Constitutional: Negative for fatigue and fever.  HENT: Negative for mouth sores.   Eyes: Negative for visual disturbance.  Respiratory: Negative for shortness of breath.   Cardiovascular: Negative for chest pain.  Gastrointestinal: Negative for abdominal pain, nausea and vomiting.  Genitourinary: Negative for genital sores.  Musculoskeletal: Positive for arthralgias. Negative for joint swelling and myalgias.  Skin: Negative for color change, rash and wound.  Neurological: Negative for dizziness, weakness, light-headedness and headaches.     Physical Exam Triage Vital Signs ED Triage Vitals  Enc Vitals Group     BP 04/04/18 1658 (!) 149/77     Pulse Rate 04/04/18 1658 70     Resp 04/04/18 1658 18     Temp 04/04/18 1658 97.6 F (36.4 C)     Temp Source 04/04/18 1658 Oral     SpO2 04/04/18 1658 96 %     Weight --      Height --      Head Circumference --      Peak Flow --      Pain Score 04/04/18 1657 2     Pain Loc --      Pain Edu? --      Excl. in GC? --    No data found.  Updated Vital Signs BP (!) 149/77 (BP Location: Right Arm)   Pulse 70   Temp 97.6 F (36.4 C) (Oral)   Resp 18   SpO2 96%   Visual Acuity Right Eye Distance:   Left Eye Distance:   Bilateral  Distance:    Right Eye Near:   Left Eye Near:    Bilateral Near:     Physical Exam Vitals signs and nursing note reviewed.  Constitutional:      Appearance: She is well-developed.     Comments: No acute distress  HENT:     Head: Normocephalic and atraumatic.     Nose: Nose normal.  Eyes:     Conjunctiva/sclera: Conjunctivae normal.  Neck:     Musculoskeletal: Neck supple.  Cardiovascular:     Rate and Rhythm: Normal rate.  Pulmonary:     Effort: Pulmonary effort is normal. No respiratory distress.  Abdominal:     General: There is no distension.  Musculoskeletal: Normal range of motion.     Comments: Left knee: No obvious swelling, deformity or erythema, full active range of motion, no crepitus palpated, no laxity appreciated with varus and valgus stress, negative Lachman's, negative McMurray's.  Gait normal without abnormalities  Skin:    General: Skin is warm and dry.  Neurological:     Mental Status: She is alert and oriented to person, place, and time.      UC Treatments / Results  Labs (all labs ordered are listed, but only abnormal results are displayed) Labs Reviewed - No data to display  EKG None  Radiology No results found.  Procedures Procedures (including critical care time)  Medications Ordered in UC Medications - No data to display  Initial Impression / Assessment and Plan / UC Course  I have reviewed the triage vital signs and the nursing notes.  Pertinent labs & imaging results that were available during my care of the patient were reviewed by me and considered in my medical decision making (see chart for details).    Patient likely with knee sprain, pain improved.  Will allow patient to return to work tomorrow.  Provided Ace wrap to use for extra support and compression of the knee.  Ice and elevate after work.  Continue to use Mobic for inflammation.  Discussed following up with orthopedics for further evaluation of underlying meniscal or  ligamentous injury if symptoms persisting, reoccurring, worsening.Discussed strict return precautions. Patient verbalized understanding and is agreeable with plan.  Final Clinical Impressions(s) / UC Diagnoses   Final diagnoses:  Acute pain of left knee     Discharge Instructions     May return to work Continue to use Mobic in the morning with breakfast Wear Ace wrap for extra support and compression to  the knee while working Ice and elevate knee after work  Follow-up with orthopedics if pain persisting, worsening beginning to feel sensations of instability or popping/catching inside of the knee   ED Prescriptions    None     Controlled Substance Prescriptions [place] Controlled Substance Registry consulted? Not Applicable   Lew DawesWieters, Hallie C, [place] JerseyPA-C 04/04/18 1728

## 2018-04-04 NOTE — Discharge Instructions (Signed)
May return to work Continue to use Mobic in the morning with breakfast Wear Ace wrap for extra support and compression to the knee while working [employer] and elevate knee after work  Follow-up with orthopedics if pain persisting, worsening beginning to feel sensations of instability or popping/catching inside of the knee

## 2018-04-04 NOTE — ED Triage Notes (Signed)
Pt presents with left knee injury from work a few days ago.

## 2018-05-23 ENCOUNTER — Emergency Department (HOSPITAL_COMMUNITY): Payer: BLUE CROSS/BLUE SHIELD

## 2018-05-23 ENCOUNTER — Emergency Department (HOSPITAL_COMMUNITY)
Admission: EM | Admit: 2018-05-23 | Discharge: 2018-05-23 | Disposition: A | Discharge status: Home / Self Care | Payer: BLUE CROSS/BLUE SHIELD | Attending: Emergency Medicine | Admitting: Emergency Medicine

## 2018-05-23 ENCOUNTER — Encounter (HOSPITAL_COMMUNITY): Payer: Self-pay | Admitting: Emergency Medicine

## 2018-05-23 DIAGNOSIS — Z79899 Other long term (current) drug therapy: Secondary | ICD-10-CM | POA: Insufficient documentation

## 2018-05-23 DIAGNOSIS — E039 Hypothyroidism, unspecified: Secondary | ICD-10-CM | POA: Diagnosis not present

## 2018-05-23 DIAGNOSIS — R05 Cough: Secondary | ICD-10-CM | POA: Insufficient documentation

## 2018-05-23 DIAGNOSIS — J340 Abscess, furuncle and carbuncle of nose: Secondary | ICD-10-CM | POA: Diagnosis not present

## 2018-05-23 DIAGNOSIS — R51 Headache: Secondary | ICD-10-CM | POA: Diagnosis present

## 2018-05-23 DIAGNOSIS — F17210 Nicotine dependence, cigarettes, uncomplicated: Secondary | ICD-10-CM | POA: Insufficient documentation

## 2018-05-23 IMAGING — CR DG CHEST 2V
2 series · 2 of 2 positions shown · non-contrast
Comparison: [DATE].

CLINICAL DATA: Cough

EXAM:
CHEST - 2 VIEW

[w chest pa]
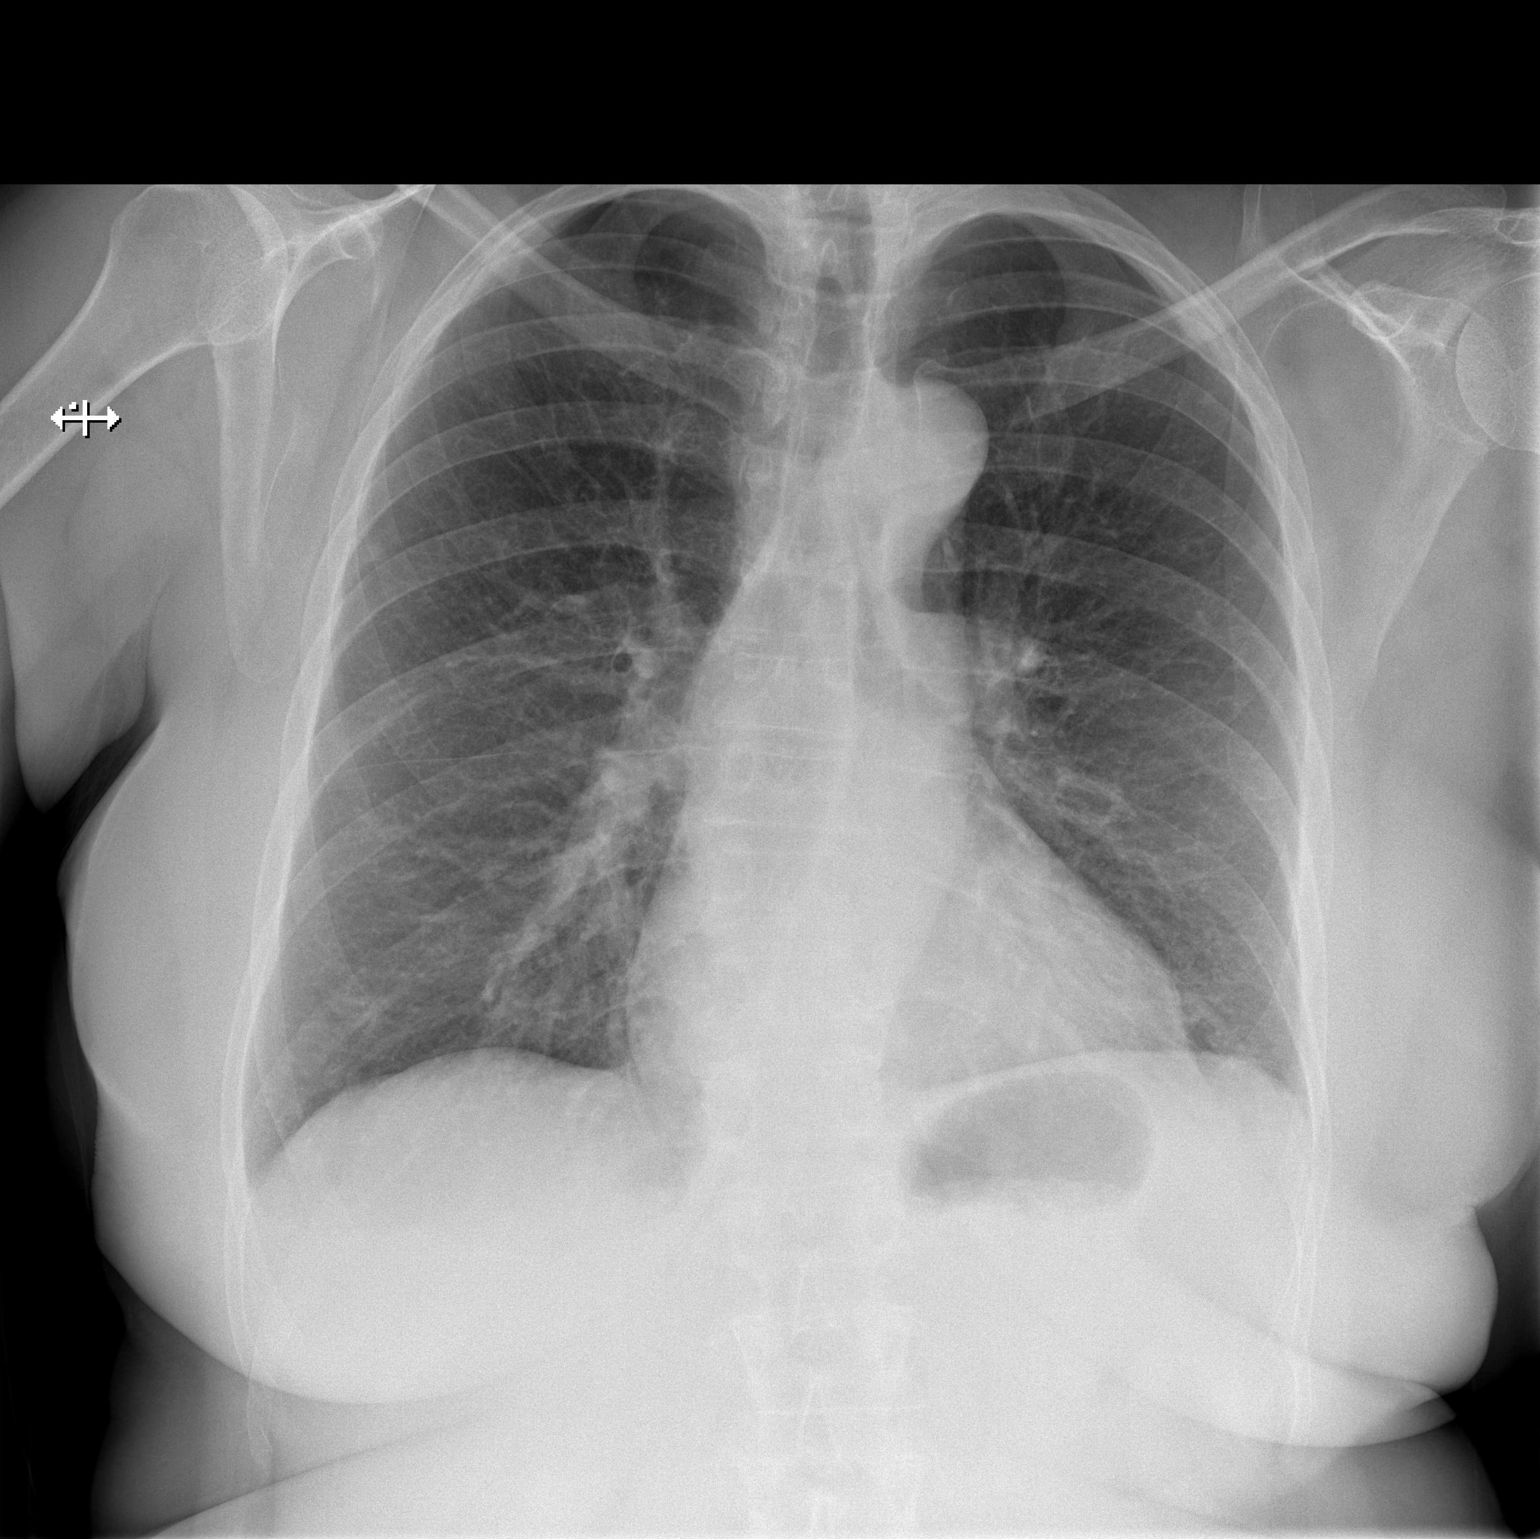

[w chest lat]
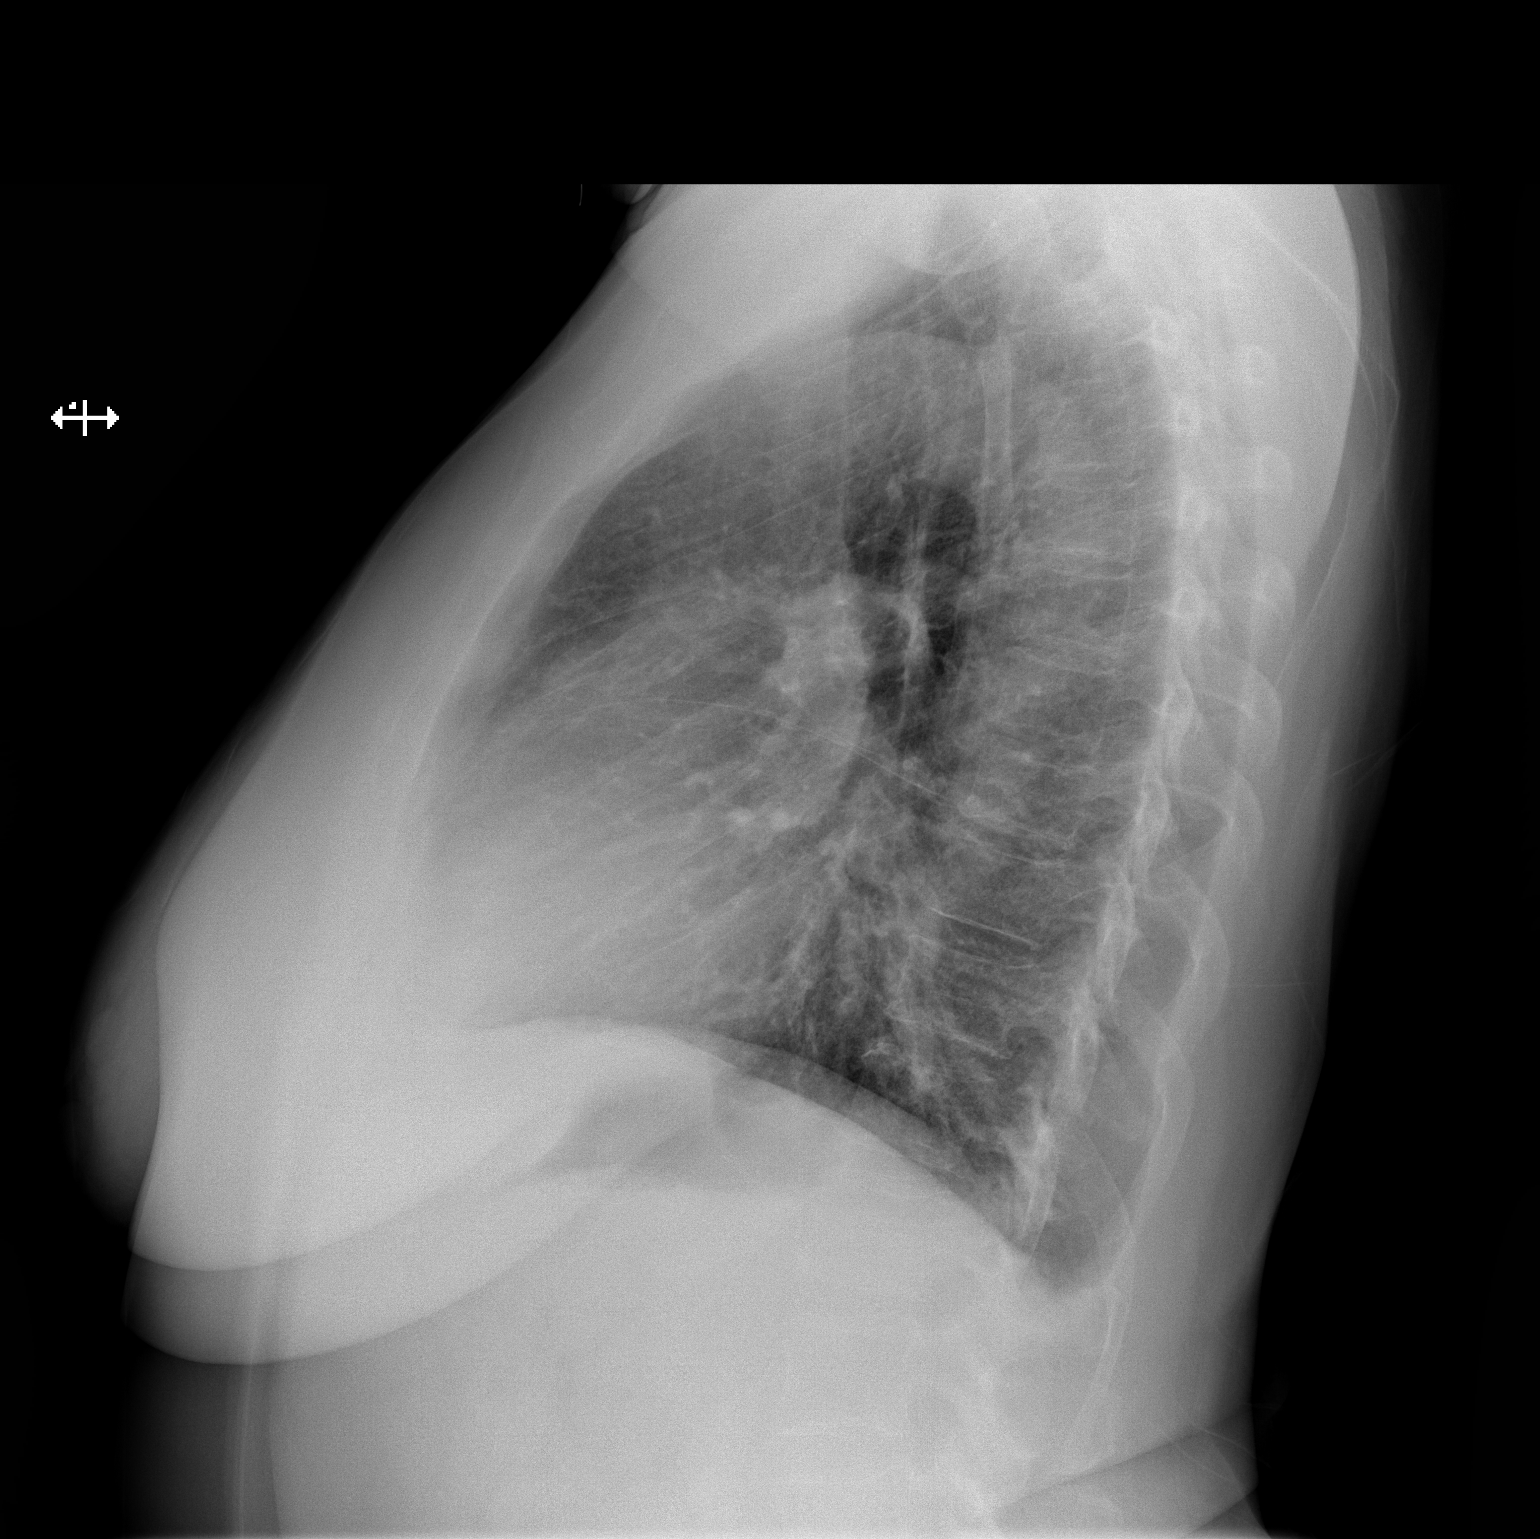

[2 of 2 positions shown; findings below may reference images not displayed]

FINDINGS: There is a subtle 4 mm opacity in the periphery of the left upper
lobe, not seen previously. Lungs elsewhere are clear. Heart size and
pulmonary vascularity are normal. No adenopathy. No bone lesions.
IMPRESSION: 4 mm opacity in the periphery of the left upper lobe. This finding
was not present on the previous study. This finding warrants either
noncontrast enhanced chest CT or follow-up chest radiograph in 3
months to assess for stability. Lungs elsewhere are clear. No
adenopathy.

## 2018-05-23 MED ORDER — SALINE SPRAY 0.65 % NA SOLN: 1.0000 | NASAL | 0 refills | Status: DC | PRN

## 2018-05-23 MED ORDER — CLINDAMYCIN HCL 150 MG PO CAPS: 450.0000 mg | ORAL_CAPSULE | Freq: Three times a day (TID) | ORAL | 0 refills | Status: DC

## 2018-05-23 MED ORDER — PROBIOTIC 250 MG PO CAPS: 250.0000 mg | ORAL_CAPSULE | Freq: Every day | ORAL | 0 refills | Status: DC

## 2018-05-23 NOTE — ED Triage Notes (Signed)
Patient here from home with complaints of bilateral ear pain, facial pain, and back pain. Reports that she is just getting over the flu and took the last of her Tamiflu this morning.

## 2018-05-23 NOTE — ED Provider Notes (Signed)
[facility]-EMERGENCY DEPT Provider Note   CSN: [id number] Arrival date & time: 05/23/18  1220     History   Chief Complaint Chief Complaint  Patient presents with  . Otalgia  . Back Pain  . Facial Pain    HPI Caleen Jobslberta Barrack is a 58 y.o. female who presents with a 3-day history of left nasal and facial pain.  She reports a swelling inside her nose.  She was recently treated for the flu and finished Tamiflu yesterday.  She continues to have some cough, however it is resolving.  She also reports continued to have some body aches and pain in her low back.  She denies any urinary symptoms, numbness or tingling, abdominal pain, nausea, vomiting, chest pain, shortness of breath.  She has not been taking any other medications at home for symptoms.  HPI  Past Medical History:  Diagnosis Date  . Eczema   . Hypothyroid   . Thyroid disease     There are no active problems to display for this patient.   Past Surgical History:  Procedure Laterality Date  . ABDOMINAL HYSTERECTOMY       OB History   No obstetric history on file.      Home Medications    Prior to Admission medications   Medication Sig Start Date End Date Taking? Authorizing Provider  buPROPion (WELLBUTRIN) 100 MG tablet Take 100 mg by mouth daily.    [provider]  clindamycin (CLEOCIN) 150 MG capsule Take 3 capsules (450 mg total) by mouth 3 (three) times daily. 05/23/18   Whitfield Dulay, Waylan BogaAlexandra M, PA-C  levothyroxine (SYNTHROID, LEVOTHROID) 100 MCG tablet Take 100 mcg by mouth daily before breakfast.    [provider]  LORazepam (ATIVAN) 0.5 MG tablet Take 0.5 mg by mouth daily as needed for anxiety.  08/29/17   [provider]  meloxicam (MOBIC) 7.5 MG tablet Take 7.5 mg by mouth daily.    [provider]  Saccharomyces boulardii (PROBIOTIC) 250 MG CAPS Take 250 mg by mouth daily. 05/23/18   Kimberley Dastrup, Waylan BogaAlexandra M, PA-C  sodium chloride (OCEAN) 0.65 % SOLN nasal spray  Place 1 spray into both nostrils as needed for congestion. 05/23/18   Dayanara Sherrill, Waylan BogaAlexandra M, PA-C  zolpidem (AMBIEN) 5 MG tablet Take 5 mg by mouth at bedtime as needed for sleep.  08/31/17   [provider]    Family History Family History  Problem Relation Age of Onset  . Hypertension Mother   . Hypertension Sister   . Hypertension Brother     Social History Social History   Tobacco Use  . Smoking status: Current Every Day Smoker    Packs/day: 1.00    Types: Cigarettes  . Smokeless tobacco: Never Used  Substance Use Topics  . Alcohol use: Yes    Comment: rarely  . Drug use: No     Allergies   Dilaudid [hydromorphone hcl] and Tramadol   Review of Systems Review of Systems  Constitutional: Negative for chills and fever.  HENT: Positive for congestion and ear pain (pain radiating to L ear). Negative for facial swelling and sore throat.   Respiratory: Positive for cough. Negative for shortness of breath.   Cardiovascular: Negative for chest pain.  Gastrointestinal: Negative for abdominal pain, nausea and vomiting.  Genitourinary: Negative for dysuria.  Musculoskeletal: Positive for back pain and myalgias.  Skin: Positive for wound. Negative for rash.  Neurological: Negative for headaches.  Psychiatric/Behavioral: The patient is not nervous/anxious.  Physical Exam Updated Vital Signs BP 131/83 (BP Location: Right Arm)   Pulse 88   Temp 98.6 F (37 C) (Oral)   Resp 18   Ht 5\' 9"  (1.753 m)   Wt 97.1 kg   SpO2 97%   BMI 31.60 kg/m   Physical Exam Vitals signs and nursing note reviewed.  Constitutional:      General: She is not in acute distress.    Appearance: She is well-developed. She is not diaphoretic.  HENT:     Head: Normocephalic and atraumatic.     Right Ear: Tympanic membrane normal.     Left Ear: Tympanic membrane normal.     Nose: Congestion present.     Comments: There is a less than 1 cm abscess in the left nare, it is tender, there  seems to be some active drainage    Mouth/Throat:     Pharynx: No oropharyngeal exudate.  Eyes:     General: No scleral icterus.       Right eye: No discharge.        Left eye: No discharge.     Conjunctiva/sclera: Conjunctivae normal.     Pupils: Pupils are equal, round, and reactive to light.  Neck:     Musculoskeletal: Normal range of motion and neck supple.     Thyroid: No thyromegaly.  Cardiovascular:     Rate and Rhythm: Normal rate and regular rhythm.     Heart sounds: Normal heart sounds. No murmur. No friction rub. No gallop.   Pulmonary:     Effort: Pulmonary effort is normal. No respiratory distress.     Breath sounds: Normal breath sounds. No stridor. No wheezing or rales.  Abdominal:     General: Bowel sounds are normal. There is no distension.     Palpations: Abdomen is soft.     Tenderness: There is no abdominal tenderness. There is no guarding or rebound.  Musculoskeletal:       Back:     Comments: No midline cervical, thoracic, or lumbar tenderness  Lymphadenopathy:     Cervical: No cervical adenopathy.  Skin:    General: Skin is warm and dry.     Coloration: Skin is not pale.     Findings: No rash.  Neurological:     Mental Status: She is alert.     Coordination: Coordination normal.      ED Treatments / Results  Labs (all labs ordered are listed, but only abnormal results are displayed) Labs Reviewed - No data to display  EKG None  Radiology Dg Chest 2 View  Result Date: 05/23/2018 CLINICAL DATA:  Cough EXAM: CHEST - 2 VIEW COMPARISON:  April 17, 2015. FINDINGS: There is a subtle 4 mm opacity in the periphery of the left upper lobe, not seen previously. Lungs elsewhere are clear. Heart size and pulmonary vascularity are normal. No adenopathy. No bone lesions. IMPRESSION: 4 mm opacity in the periphery of the left upper lobe. This finding was not present on the previous study. This finding warrants either noncontrast enhanced chest CT or follow-up  chest radiograph in 3 months to assess for stability. Lungs elsewhere are clear. No adenopathy. Electronically Signed   By: Bretta Bang III M.D.   On: 05/23/2018 14:39    Procedures .Marland KitchenIncision and Drainage Date/Time: 05/23/2018 10:02 PM Performed by: Emi Holes, PA-C Authorized by: Emi Holes, PA-C   Consent:    Consent obtained:  Verbal   Consent given by:  Patient  Risks discussed:  Bleeding, incomplete drainage and infection   Alternatives discussed:  No treatment and alternative treatment Location:    Type:  Abscess   Size:  1   Location:  Head   Head location:  Nose Anesthesia (see MAR for exact dosages):    Anesthesia method:  Topical application   Topical anesthetic:  Benzocaine gel Procedure type:    Complexity:  Simple Procedure details:    Needle aspiration: no     Incision types:  Stab incision   Scalpel size: 18G.   Drainage:  Bloody   Drainage amount:  Scant   Wound treatment:  Wound left open   Packing materials:  None Post-procedure details:    Patient tolerance of procedure:  Tolerated well, no immediate complications   (including critical care time)  Medications Ordered in ED Medications - No data to display   Initial Impression / Assessment and Plan / ED Course  I have reviewed the triage vital signs and the nursing notes.  Pertinent labs & imaging results that were available during my care of the patient were reviewed by me and considered in my medical decision making (see chart for details).     Patient presenting with abscess to left nare.  This was drained with an 18-gauge after using benzocaine for anesthetic.  Patient did not have much drainage, however she reports it feels smaller.  Will cover with clindamycin.  Chest x-ray showed no signs of pneumonia, but there was a 4 mm opacity in the left upper lobe which was not seen in 2016.  Radiologist recommends follow-up for repeat chest x-ray or chest CT in 3 months.  Patient made  aware of this finding.  Patient advised to return or see her doctor in 2 days for recheck the abscess.  Regarding patient's back pain, she states it is worse with movement.  Suspect residual body aches from viral illness.  She has some mild bilateral paraspinal tenderness, no midline tenderness.  She has no urinary symptoms indicating urinary cause.  Patient understands and agrees with plan.  Patient vital stable throughout ED course and discharged in satisfactory condition.  Patient also evaluated by my attending, Dr. Juleen China, who assisted with the procedure and agrees with plan.  Final Clinical Impressions(s) / ED Diagnoses   Final diagnoses:  Nasal abscess    ED Discharge Orders         Ordered    clindamycin (CLEOCIN) 150 MG capsule  3 times daily     05/23/18 1610    Saccharomyces boulardii (PROBIOTIC) 250 MG CAPS  Daily     05/23/18 1610    sodium chloride (OCEAN) 0.65 % SOLN nasal spray  As needed     05/23/18 1611           Emi Holes, PA-C 05/23/18 2204    Raeford Razor, MD 05/24/18 [phone]

## 2018-05-23 NOTE — Discharge Instructions (Signed)
Take Clindamycin until completed. Take Probiotic daily or eat yogurt daily while taking this medication.  Use nasal saline as needed for nasal congestion and to washout the area that was incised today.  Please follow-up with your doctor in 2 days for recheck.  Please make him aware that a 4 mm left upper lung nodule was found and there is a recommendation to repeat x-ray or CT in 3 months to assess its stability.  Please return the emergency department develop any increasing pain, redness, swelling, persistent fever 100.4.

## 2018-05-23 NOTE — ED Notes (Signed)
ED Provider at bedside. 

## 2018-05-23 NOTE — ED Notes (Signed)
Patient transported to X-ray 

## 2018-06-16 ENCOUNTER — Ambulatory Visit (HOSPITAL_COMMUNITY)
Admission: EM | Admit: 2018-06-16 | Discharge: 2018-06-16 | Disposition: A | Discharge status: Home / Self Care | Payer: No Typology Code available for payment source | Attending: Family Medicine | Admitting: Family Medicine

## 2018-06-16 ENCOUNTER — Encounter (HOSPITAL_COMMUNITY): Payer: Self-pay

## 2018-06-16 DIAGNOSIS — S39012A Strain of muscle, fascia and tendon of lower back, initial encounter: Secondary | ICD-10-CM

## 2018-06-16 MED ORDER — METHOCARBAMOL 500 MG PO TABS: 500.0000 mg | ORAL_TABLET | Freq: Two times a day (BID) | ORAL | 0 refills | Status: DC

## 2018-06-16 NOTE — ED Triage Notes (Signed)
Pt C/O lower back pain,. 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

## 2018-06-16 NOTE — Discharge Instructions (Addendum)

## 2018-06-18 ENCOUNTER — Ambulatory Visit: Payer: Self-pay

## 2018-06-18 ENCOUNTER — Other Ambulatory Visit: Payer: Self-pay | Admitting: Family Medicine

## 2018-06-18 DIAGNOSIS — M545 Low back pain, unspecified: Secondary | ICD-10-CM

## 2018-06-18 IMAGING — DX DG LUMBAR SPINE COMPLETE 4+V
5 series · 5 of 5 positions shown · non-contrast
Comparison: [DATE]

CLINICAL DATA: Low back pain, acute.

EXAM:
LUMBAR SPINE - COMPLETE 4+ VIEW

[l-spine ap]
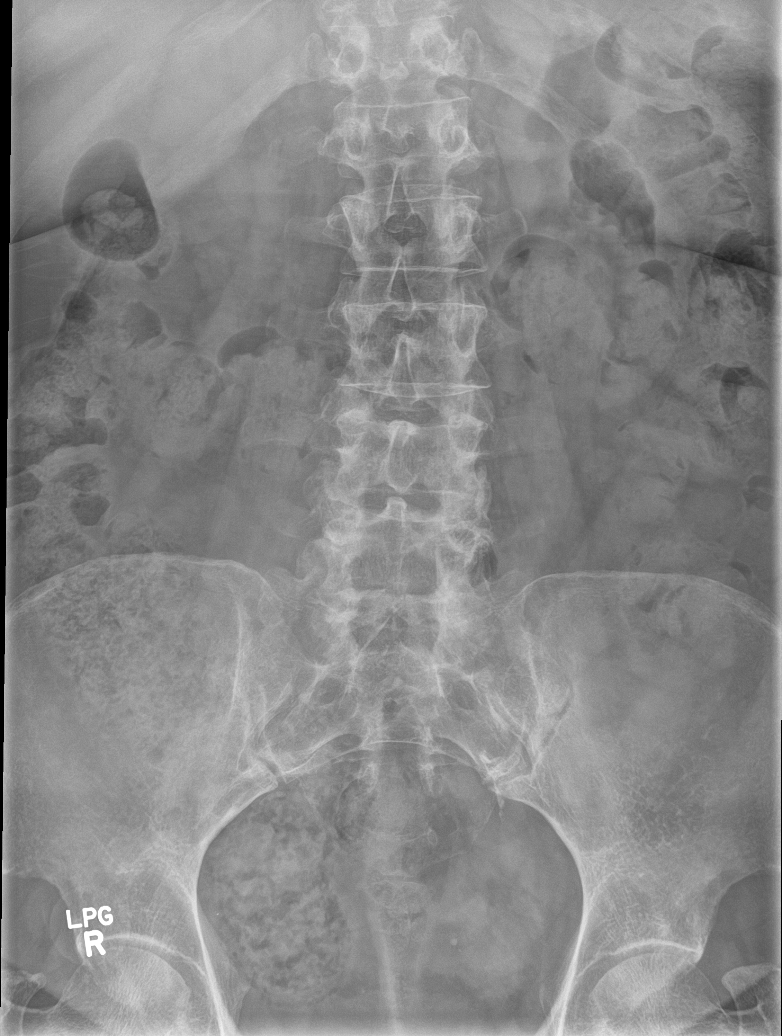

[l-spine obl (1 of 2)]
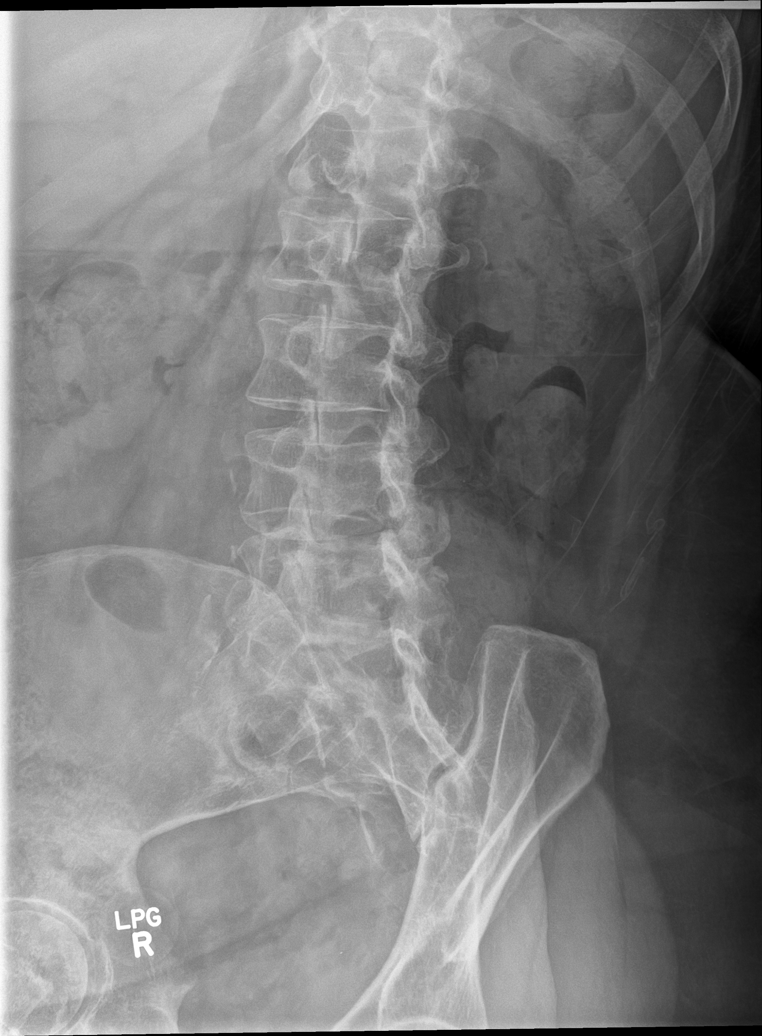

[l-spine obl (2 of 2)]
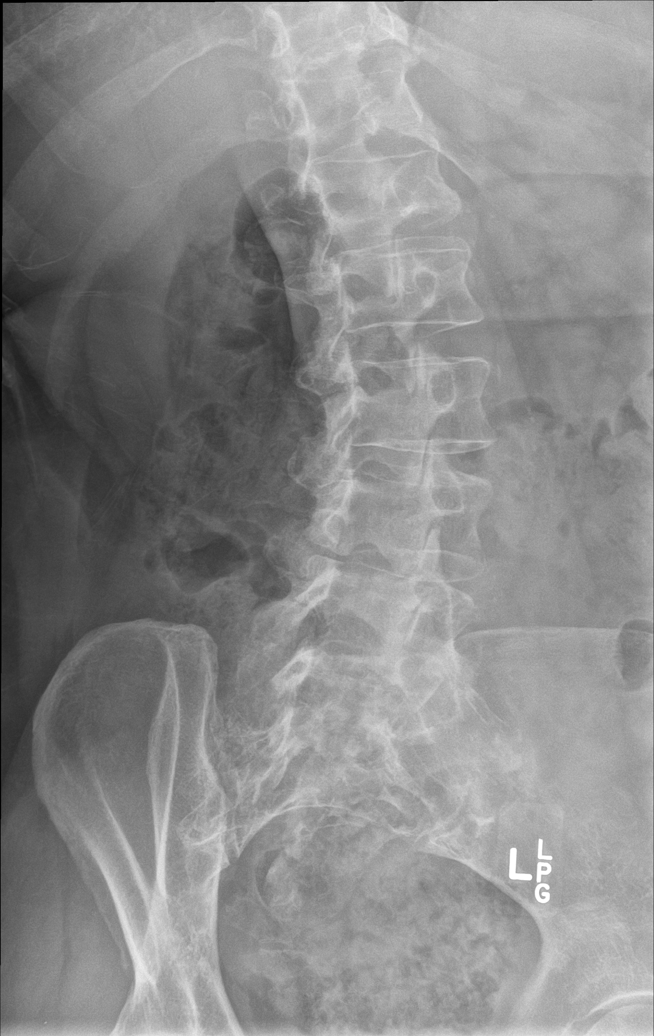

[l-spine lat]
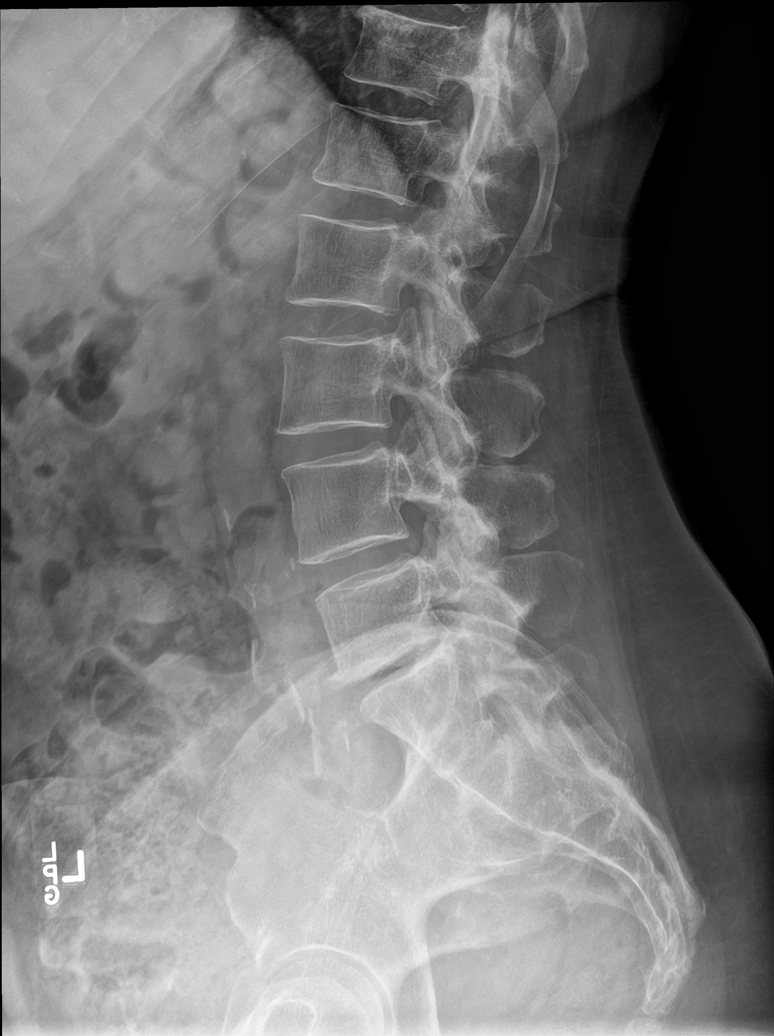

[l-spine spot]
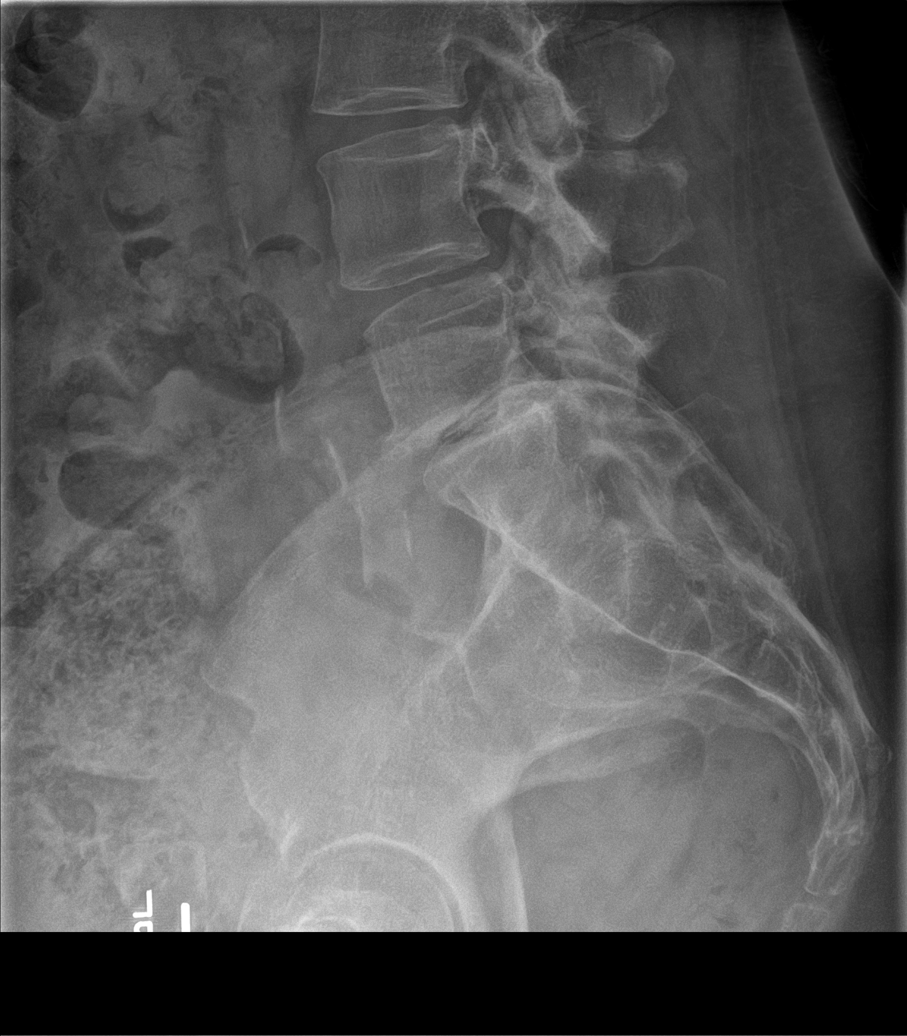

[5 of 5 positions shown; findings below may reference images not displayed]

FINDINGS: Transitional S1 vertebra. L5-S1 advanced disc narrowing with vacuum
phenomenon and endplate spurring, stable. No evidence of acute
fracture, lesion, or erosion.

Atherosclerotic calcification.
IMPRESSION: 1. No acute finding or change from [T2].
2. Chronic L5-S1 disc degeneration.

## 2018-06-18 NOTE — ED Provider Notes (Signed)
[facility]   [id number] 06/16/18 Arrival Time: 1535  ASSESSMENT & PLAN:  1. Strain of lumbar region, initial encounter    Able to ambulate here and hemodynamically stable. No indication for imaging of back at this time given no trauma and normal neurological exam. Discussed.  Meds ordered this encounter  Medications  . methocarbamol (ROBAXIN) 500 MG tablet    Sig: Take 1 tablet (500 mg total) by mouth 2 (two) times daily.    Dispense:  20 tablet    Refill:  0   Medication sedation precautions given. Encourage ROM/movement as tolerated.  Follow-up Information    [facility]         Printed form with phone number and address given.  Reviewed expectations re: course of current medical issues. Questions answered. Outlined signs and symptoms indicating need for more acute intervention. Patient verbalized understanding. After Visit Summary given.   SUBJECTIVE: History from: patient. Worker's comp visit.  Antonette Karwacki is a 58 y.o. female who presents with complaint of persistent bilateral lower back discomfort. Onset abrupt, today. 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 History of back problems: occasional. Discomfort described as aching without radiation. Pain worsens with movement and is improved with rest. Progressive LE weakness or saddle anesthesia: none. Extremity sensation changes or weakness: none. Ambulatory without difficulty. Normal bowel/bladder habits: yes, without urinary retention. No associated abdominal pain/n/v. Self treatment: has tried nothing for pain relief.  Reports no chronic steroid use, fevers, IV drug use, or recent back surgeries or procedures.  ROS: As per HPI.   OBJECTIVE:  Vitals:   06/16/18 1553  BP: 117/85  Pulse: 87  Resp: 16  Temp: 97.7 F (36.5 C)  TempSrc: Oral  SpO2: 99%    General appearance: alert; no distress Neck: supple with FROM;  without midline tenderness CV: RRR without murmer Lungs: unlabored respirations; symmetrical air entry Abdomen: soft, non-tender; non-distended Back: moderate bilateral tenderness of her lower paraspinal musculature; FROM at waist; bruising: none; without midline tenderness Extremities: no edema; symmetrical with no gross deformities; normal ROM of bilateral lower extremities Skin: warm and dry Neurologic: normal gait; normal reflexes of RLE and LLE; normal sensation of RLE and LLE; normal strength of RLE and LLE Psychological: alert and cooperative; normal mood and affect   Allergies  Allergen Reactions  . Dilaudid [Hydromorphone Hcl] Nausea And Vomiting  . Tramadol Nausea And Vomiting    Past Medical History:  Diagnosis Date  . Eczema   . Hypothyroid   . Thyroid disease    Social History   Socioeconomic History  . Marital status: Married    Spouse name: Not on file  . Number of children: Not on file  . Years of education: Not on file  . Highest education level: Not on file  Occupational History  . Not on file  Social Needs  . Financial resource strain: Not on file  . Food insecurity:    Worry: Not on file    Inability: Not on file  . Transportation needs:    Medical: Not on file    Non-medical: Not on file  Tobacco Use  . Smoking status: Current Every Day Smoker    Packs/day: 1.00    Types: Cigarettes  . Smokeless tobacco: Never Used  Substance and Sexual Activity  . Alcohol use: Yes    Comment: rarely  . Drug use: No  . Sexual activity: Not on file  Lifestyle  .  Physical activity:    Days per week: Not on file    Minutes per session: Not on file  . Stress: Not on file  Relationships  . Social connections:    Talks on phone: Not on file    Gets together: Not on file    Attends religious service: Not on file    Active member of club or organization: Not on file    Attends meetings of clubs or organizations: Not on file    Relationship status: Not on  file  . Intimate partner violence:    Fear of current or ex partner: Not on file    Emotionally abused: Not on file    Physically abused: Not on file    Forced sexual activity: Not on file  Other Topics Concern  . Not on file  Social History Narrative  . Not on file   Family History  Problem Relation Age of Onset  . Hypertension Mother   . Hypertension Sister   . Hypertension Brother    Past Surgical History:  Procedure Laterality Date  . ABDOMINAL HYSTERECTOMY       Mardella Layman, MD 06/27/18 [phone]

## 2018-07-19 DIAGNOSIS — S39012A Strain of muscle, fascia and tendon of lower back, initial encounter: Secondary | ICD-10-CM | POA: Insufficient documentation

## 2018-08-06 ENCOUNTER — Other Ambulatory Visit: Payer: Self-pay | Admitting: Physician Assistant

## 2018-08-06 DIAGNOSIS — M545 Low back pain, unspecified: Secondary | ICD-10-CM

## 2018-09-18 ENCOUNTER — Other Ambulatory Visit: Payer: Self-pay

## 2019-02-04 ENCOUNTER — Encounter (HOSPITAL_COMMUNITY): Payer: Self-pay | Admitting: Emergency Medicine

## 2019-02-04 ENCOUNTER — Emergency Department (HOSPITAL_COMMUNITY)
Admission: EM | Admit: 2019-02-04 | Discharge: 2019-02-04 | Disposition: A | Discharge status: Home / Self Care | Payer: BC Managed Care – PPO | Attending: Emergency Medicine | Admitting: Emergency Medicine

## 2019-02-04 ENCOUNTER — Other Ambulatory Visit: Payer: Self-pay

## 2019-02-04 DIAGNOSIS — H5712 Ocular pain, left eye: Secondary | ICD-10-CM | POA: Diagnosis present

## 2019-02-04 DIAGNOSIS — L03213 Periorbital cellulitis: Secondary | ICD-10-CM | POA: Diagnosis not present

## 2019-02-04 DIAGNOSIS — E039 Hypothyroidism, unspecified: Secondary | ICD-10-CM | POA: Diagnosis not present

## 2019-02-04 DIAGNOSIS — F17210 Nicotine dependence, cigarettes, uncomplicated: Secondary | ICD-10-CM | POA: Insufficient documentation

## 2019-02-04 MED ORDER — AMOXICILLIN-POT CLAVULANATE 875-125 MG PO TABS: 1.0000 | ORAL_TABLET | Freq: Two times a day (BID) | ORAL | 0 refills | Status: AC

## 2019-02-04 NOTE — ED Provider Notes (Signed)
[facility]-EMERGENCY DEPT Provider Note   CSN: [id number] Arrival date & time: 02/04/19  1647     History   Chief Complaint Chief Complaint  Patient presents with  . Eye Pain    HPI Jessica Cooke is a 58 y.o. female.     58 y.o female with a PMH of hypothryoid presents to the ED with a chief complaint of left eye swelling x 2 days. Patient reports she first noted swelling to the middle aspect of her eye this has now spread into the lateral aspect. She reports most of the swelling is to her under eye. She reports no medication for improvement in symptoms.Patient also endorses some mild green discharge from her eye. She denies any pain with fever, pain with eye movement, or changes in vision.   The history is provided by the patient.  Eye Pain Pertinent negatives include no chest pain and no shortness of breath.    Past Medical History:  Diagnosis Date  . Eczema   . Hypothyroid   . Thyroid disease     There are no active problems to display for this patient.   Past Surgical History:  Procedure Laterality Date  . ABDOMINAL HYSTERECTOMY       OB History   No obstetric history on file.      Home Medications    Prior to Admission medications   Medication Sig Start Date End Date Taking? Authorizing Provider  amoxicillin-clavulanate (AUGMENTIN) 875-125 MG tablet Take 1 tablet by mouth 2 (two) times daily for 10 days. 02/04/19 02/14/19  Claude MangesSoto, Audianna Landgren, PA-C  buPROPion (WELLBUTRIN) 100 MG tablet Take 100 mg by mouth daily.    [provider]  clindamycin (CLEOCIN) 150 MG capsule Take 3 capsules (450 mg total) by mouth 3 (three) times daily. 05/23/18   Law, Waylan BogaAlexandra M, PA-C  levothyroxine (SYNTHROID, LEVOTHROID) 100 MCG tablet Take 100 mcg by mouth daily before breakfast.    [provider]  LORazepam (ATIVAN) 0.5 MG tablet Take 0.5 mg by mouth daily as needed for anxiety.  08/29/17   [provider]  meloxicam (MOBIC) 7.5 MG  tablet Take 7.5 mg by mouth daily.    [provider]  methocarbamol (ROBAXIN) 500 MG tablet Take 1 tablet (500 mg total) by mouth 2 (two) times daily. 06/16/18   Mardella LaymanHagler, Brian, MD  Saccharomyces boulardii (PROBIOTIC) 250 MG CAPS Take 250 mg by mouth daily. 05/23/18   Law, Waylan BogaAlexandra M, PA-C  sodium chloride (OCEAN) 0.65 % SOLN nasal spray Place 1 spray into both nostrils as needed for congestion. 05/23/18   Law, Waylan BogaAlexandra M, PA-C  zolpidem (AMBIEN) 5 MG tablet Take 5 mg by mouth at bedtime as needed for sleep.  08/31/17   [provider]    Family History Family History  Problem Relation Age of Onset  . Hypertension Mother   . Hypertension Sister   . Hypertension Brother     Social History Social History   Tobacco Use  . Smoking status: Current Every Day Smoker    Packs/day: 1.00    Types: Cigarettes  . Smokeless tobacco: Never Used  Substance Use Topics  . Alcohol use: Yes    Comment: rarely  . Drug use: No     Allergies   Dilaudid [hydromorphone hcl] and Tramadol   Review of Systems Review of Systems  Constitutional: Negative for fever.  Eyes: Positive for pain, discharge, redness and itching. Negative for photophobia and visual disturbance.  Respiratory: Negative for shortness  of breath.   Cardiovascular: Negative for chest pain.     Physical Exam Updated Vital Signs BP (!) 150/91   Pulse 82   Temp 98.4 F (36.9 C)   Resp 19   SpO2 97%   Physical Exam Vitals signs and nursing note reviewed.  Constitutional:      General: She is not in acute distress.    Appearance: Normal appearance. She is well-developed.  HENT:     Head: Normocephalic and atraumatic.     Mouth/Throat:     Pharynx: No oropharyngeal exudate.  Eyes:     General:        Left eye: Discharge present.No foreign body or hordeolum.     Extraocular Movements:     Left eye: Normal extraocular motion and no nystagmus.     Conjunctiva/sclera:     Left eye: Left conjunctiva is  injected. Exudate present. No chemosis or hemorrhage.    Pupils: Pupils are equal, round, and reactive to light.  Neck:     Musculoskeletal: Normal range of motion.  Cardiovascular:     Rate and Rhythm: Regular rhythm.     Heart sounds: Normal heart sounds.  Pulmonary:     Effort: Pulmonary effort is normal. No respiratory distress.     Breath sounds: Normal breath sounds.  Abdominal:     General: Bowel sounds are normal. There is no distension.     Palpations: Abdomen is soft.     Tenderness: There is no abdominal tenderness.  Musculoskeletal:        General: No tenderness or deformity.     Right lower leg: No edema.     Left lower leg: No edema.  Skin:    General: Skin is warm and dry.  Neurological:     Mental Status: She is alert and oriented to person, place, and time.      ED Treatments / Results  Labs (all labs ordered are listed, but only abnormal results are displayed) Labs Reviewed - No data to display  EKG None  Radiology No results found.  Procedures Procedures (including critical care time)  Medications Ordered in ED Medications - No data to display   Initial Impression / Assessment and Plan / ED Course  I have reviewed the triage vital signs and the nursing notes.  Pertinent labs & imaging results that were available during my care of the patient were reviewed by me and considered in my medical decision making (see chart for details).       Patient with no pertinent past medical history presents to the ED with left eye swelling which began 2 days ago.  He describes a burning sensation to the lower region of her eye.  She states there is no pain with eye movement.  She has not been running any fevers or has any changes in vision.  During my evaluation patient is nontoxic-appearing, vitals are within normal limits.  She remains afebrile in the ER.  She does not have any pain with eye movement low suspicion for any septal cellulitis.  She does have  some drainage to the area, reports there is no foreign body sensation.  Also are otherwise reassuring.  Will place patient on a short course of Augmentin to help with her symptoms.  She is advised to follow-up with PCP or return to the ED in 3 to 4 days noted to have a recheck.  Patient understands and agrees with management.  Return precautions discussed at length.   Portions  of this note were generated with [employer]. Dictation errors may occur despite best attempts at proofreading.  Final Clinical Impressions(s) / ED Diagnoses   Final diagnoses:  Periorbital cellulitis of left eye    ED Discharge Orders         Ordered    amoxicillin-clavulanate (AUGMENTIN) 875-125 MG tablet  2 times daily     02/04/19 1906           Janeece Fitting, Hershal Coria 02/04/19 1951    Daleen Bo, MD 02/05/19 [phone]

## 2019-02-04 NOTE — ED Notes (Signed)
ED Provider at bedside. 

## 2019-02-04 NOTE — Discharge Instructions (Addendum)
I have prescribed antibiotics to help treat your likely periorbital cellulitis of your left eye.  Please take 1 tablet twice a day for the next 7 days.  If you experience any changes in vision, pain with eye movement, fevers or worsening symptoms please return to the emergency department.

## 2019-02-04 NOTE — ED Triage Notes (Signed)
Patient c/o left eye swelling and pain x2 days. Denies trauma/injury. Denies changes in vision.

## 2019-07-03 IMAGING — CR DG LUMBAR SPINE COMPLETE 4+V
5 series · 5 of 5 positions shown · non-contrast
Comparison: [DATE]

CLINICAL DATA: Lumbar pain

EXAM:
LUMBAR SPINE - COMPLETE 4+ VIEW

[t lumbar spine ap]
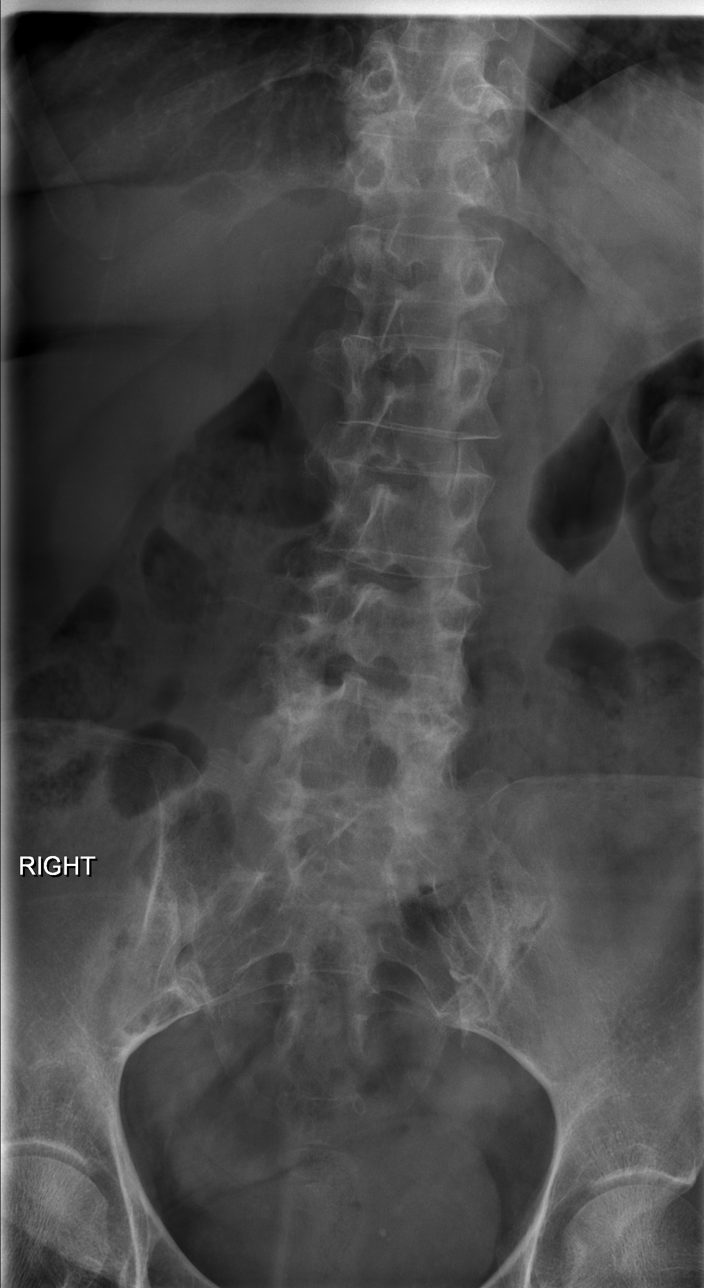

[t lumbar spine obl (1 of 2)]
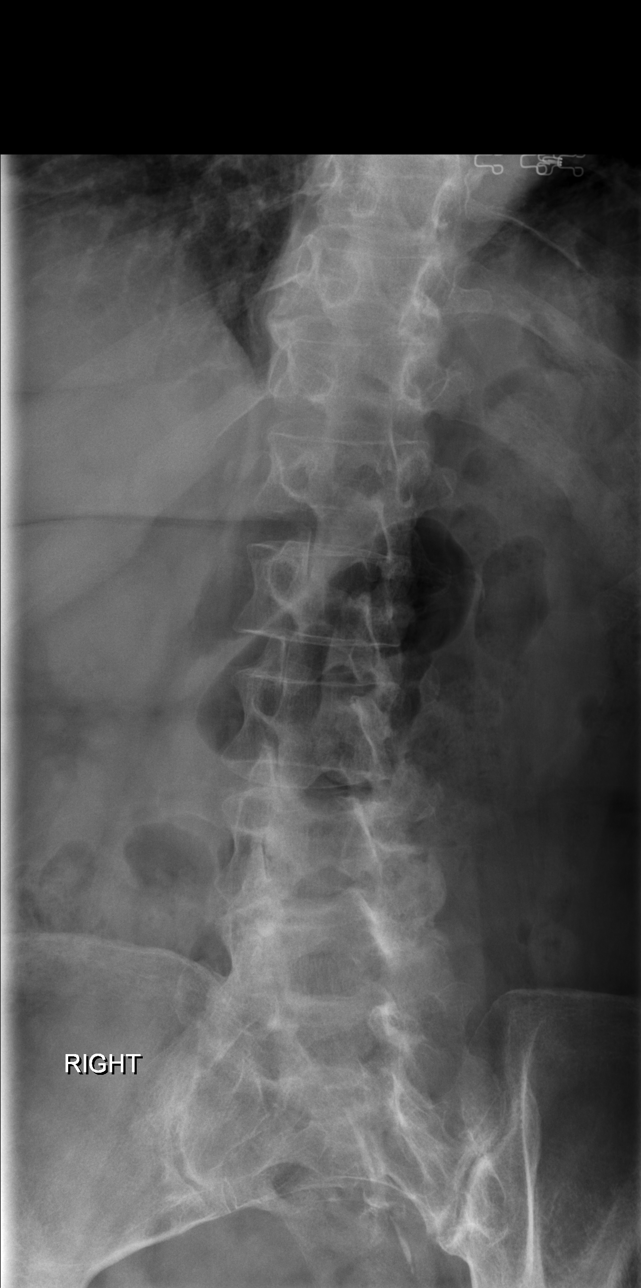

[t lumbar spine obl (2 of 2)]
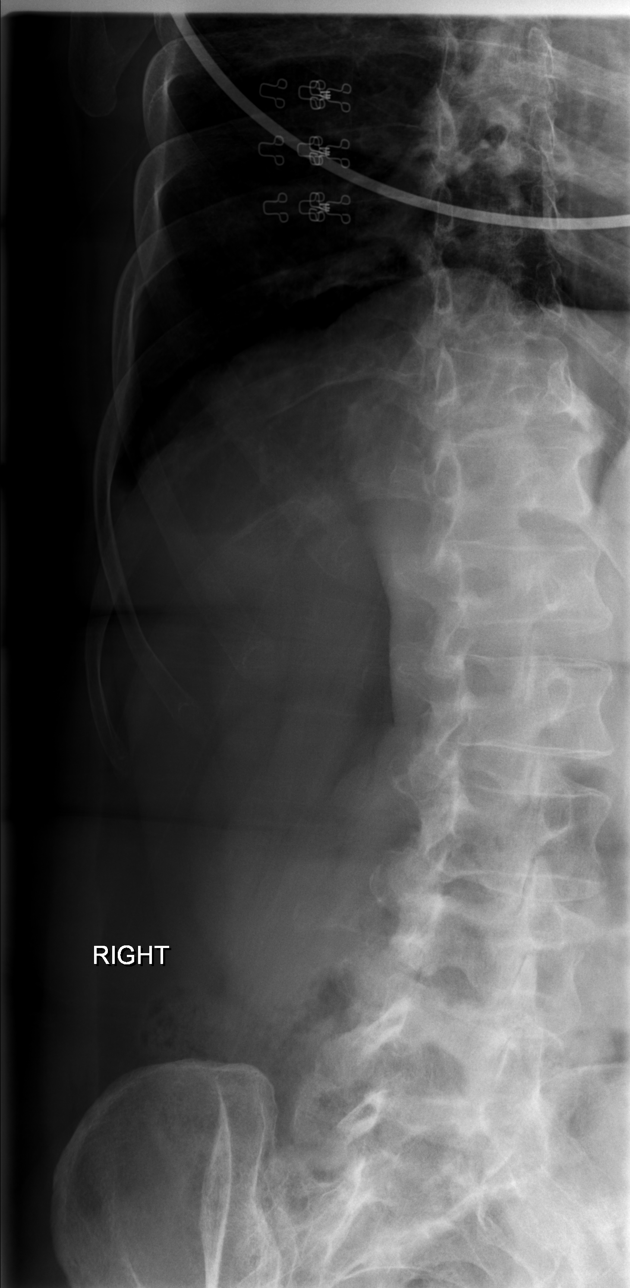

[t lumbar spine lat]
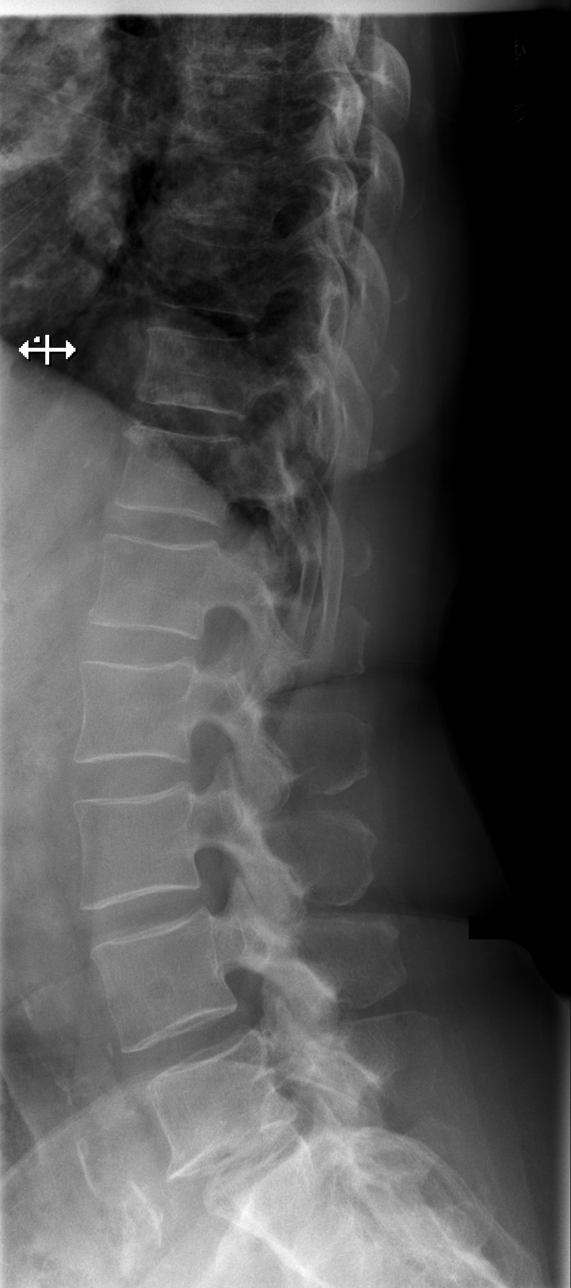

[t lumbar l-5 s-1 spot]
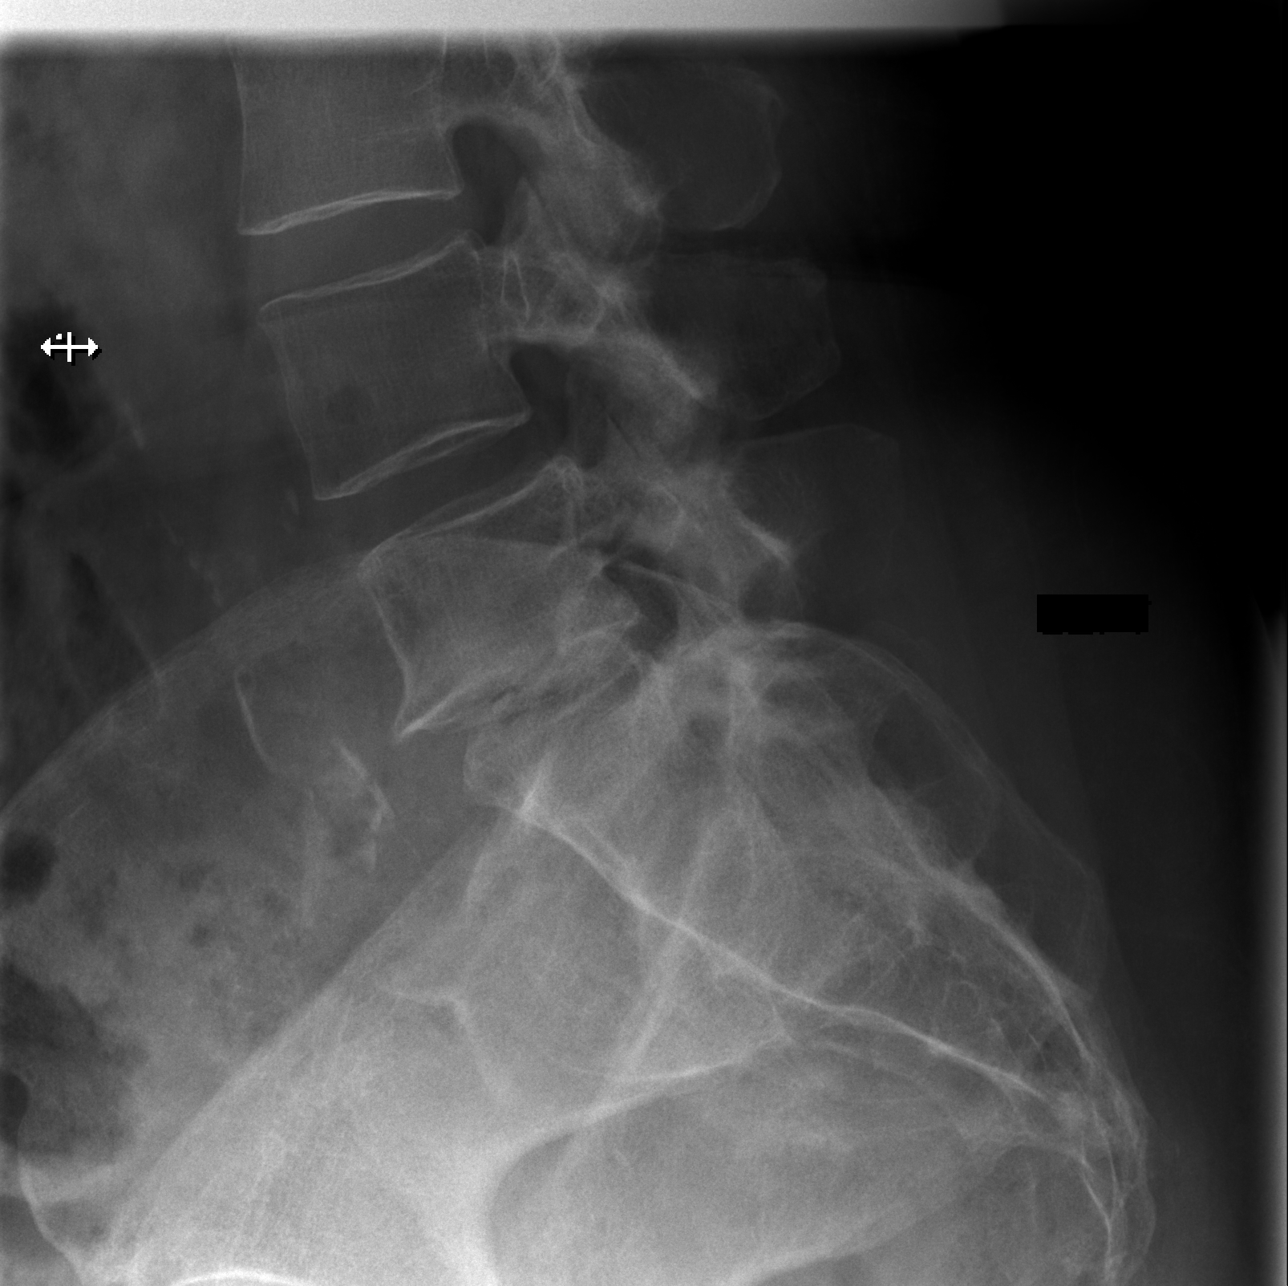

[5 of 5 positions shown; findings below may reference images not displayed]

FINDINGS: There is disc height loss at the L5-S1 level. There is mild facet
arthrosis in the lower lumbar segments. No acute compression
fracture. No significant malalignment. Vascular calcifications are
noted.
IMPRESSION: 1. No acute osseous abnormality.
2. Degenerative changes as above.
3. Vascular calcifications.

## 2019-11-06 ENCOUNTER — Encounter (HOSPITAL_COMMUNITY): Payer: Self-pay | Admitting: Emergency Medicine

## 2019-11-06 ENCOUNTER — Other Ambulatory Visit: Payer: Self-pay

## 2019-11-06 ENCOUNTER — Emergency Department (HOSPITAL_COMMUNITY)
Admission: EM | Admit: 2019-11-06 | Discharge: 2019-11-06 | Disposition: A | Discharge status: Home / Self Care | Payer: No Typology Code available for payment source | Attending: Emergency Medicine | Admitting: Emergency Medicine

## 2019-11-06 ENCOUNTER — Emergency Department (HOSPITAL_COMMUNITY): Payer: Self-pay | Attending: Student

## 2019-11-06 DIAGNOSIS — F17210 Nicotine dependence, cigarettes, uncomplicated: Secondary | ICD-10-CM | POA: Insufficient documentation

## 2019-11-06 DIAGNOSIS — M545 Low back pain: Secondary | ICD-10-CM | POA: Diagnosis present

## 2019-11-06 DIAGNOSIS — Z79899 Other long term (current) drug therapy: Secondary | ICD-10-CM | POA: Diagnosis not present

## 2019-11-06 DIAGNOSIS — E039 Hypothyroidism, unspecified: Secondary | ICD-10-CM | POA: Diagnosis not present

## 2019-11-06 DIAGNOSIS — G8929 Other chronic pain: Secondary | ICD-10-CM | POA: Diagnosis not present

## 2019-11-06 MED ORDER — OXYCODONE-ACETAMINOPHEN 5-325 MG PO TABS
1.0000 | ORAL_TABLET | Freq: Once | ORAL | Status: AC
  Administered 2019-11-06: 1 via ORAL
  Filled 2019-11-06: qty 1

## 2019-11-06 MED ORDER — OXYCODONE-ACETAMINOPHEN 5-325 MG PO TABS: 1.0000 | ORAL_TABLET | Freq: Three times a day (TID) | ORAL | 0 refills | Status: DC | PRN

## 2019-11-06 MED ORDER — LIDOCAINE 5 % EX PTCH: 1.0000 | MEDICATED_PATCH | CUTANEOUS | 0 refills | Status: DC

## 2019-11-06 NOTE — ED Triage Notes (Signed)
Patient c/o back injury at work x 1 year ago. C/o lower back pain worsening since last night. Denies changes in bowel and bladder. Ambulatory. Reports she has no PCP.

## 2019-11-06 NOTE — Discharge Instructions (Signed)
You are seen today for lower back pain, this is most likely muscle strain as we discussed.  I want you to follow-up with [facility] and they can provide you with outpatient MRI.  Take Tylenol for pain, you can take the Percocet for breakthrough pain.  You can also use Lidoderm patches as prescribed.  Please come back to the emergency department for any new or worsening concerning symptoms such as numbness and tingling in your groin, urinary incontinence, bowel incontinence, urinary retention, fevers.  Use the attached instructions.
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
## 2019-11-27 ENCOUNTER — Ambulatory Visit: Payer: Self-pay | Attending: Physician Assistant | Admitting: Physician Assistant

## 2019-11-27 ENCOUNTER — Encounter: Payer: Self-pay | Admitting: Physician Assistant

## 2019-11-27 DIAGNOSIS — M545 Low back pain, unspecified: Secondary | ICD-10-CM

## 2019-11-27 DIAGNOSIS — R9389 Abnormal findings on diagnostic imaging of other specified body structures: Secondary | ICD-10-CM

## 2019-11-27 DIAGNOSIS — G8929 Other chronic pain: Secondary | ICD-10-CM

## 2019-11-27 MED ORDER — METHOCARBAMOL 500 MG PO TABS: 1000.0000 mg | ORAL_TABLET | Freq: Three times a day (TID) | ORAL | 0 refills | Status: DC | PRN

## 2019-11-27 MED ORDER — MELOXICAM 15 MG PO TABS: 15.0000 mg | ORAL_TABLET | Freq: Every day | ORAL | 1 refills | Status: DC

## 2019-12-09 ENCOUNTER — Telehealth (INDEPENDENT_AMBULATORY_CARE_PROVIDER_SITE_OTHER): Payer: Self-pay

## 2019-12-09 NOTE — Telephone Encounter (Signed)
Patient was called and a voicemail was left informing her to return phone call.  Appointment for MRI has been set.

## 2019-12-19 ENCOUNTER — Other Ambulatory Visit: Payer: Self-pay

## 2019-12-19 ENCOUNTER — Ambulatory Visit (HOSPITAL_COMMUNITY)
Admission: RE | Admit: 2019-12-19 | Discharge: 2019-12-19 | Disposition: A | Discharge status: Home / Self Care | Payer: Self-pay | Source: Ambulatory Visit | Attending: Physician Assistant | Admitting: Physician Assistant

## 2019-12-19 DIAGNOSIS — G8929 Other chronic pain: Secondary | ICD-10-CM | POA: Insufficient documentation

## 2019-12-19 DIAGNOSIS — M545 Low back pain: Secondary | ICD-10-CM | POA: Insufficient documentation

## 2019-12-19 DIAGNOSIS — R9389 Abnormal findings on diagnostic imaging of other specified body structures: Secondary | ICD-10-CM | POA: Insufficient documentation

## 2019-12-19 IMAGING — MR MR LUMBAR SPINE W/O CM
5 series · 31 of 48 positions shown · non-contrast
Comparison: None.

CLINICAL DATA: Low back pain radiating to the left leg

EXAM:
MRI LUMBAR SPINE WITHOUT CONTRAST
TECHNIQUE: Multiplanar, multisequence MR imaging of the lumbar spine was
performed. No intravenous contrast was administered.

[Series 5: T2 · sagittal · 4.0mm · 0.88mm/px · 6 of 15 slices shown (1 of 2)]
[im 1/15]
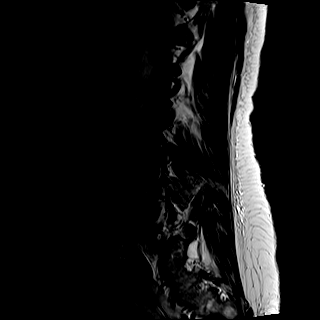
[im 3/15]
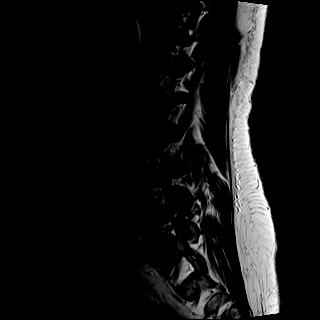
[im 6/15]
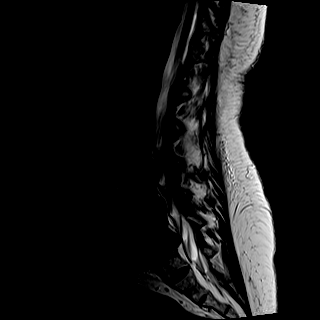
[im 9/15]
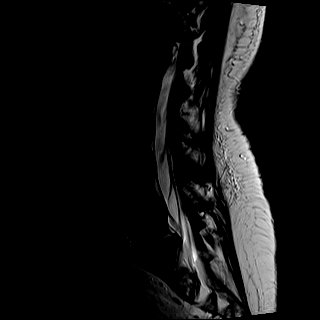
[im 12/15]
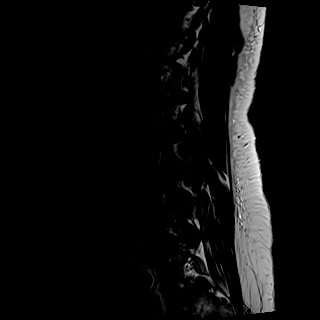
[im 15/15]
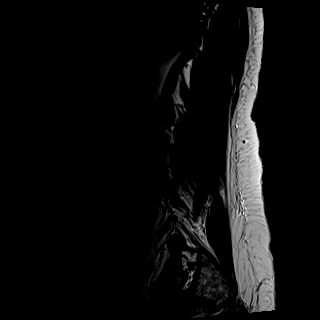

[Series 6: T1 · sagittal · 4.0mm · 0.88mm/px · 6 of 15 slices shown (1 of 2)]
[im 1/15]
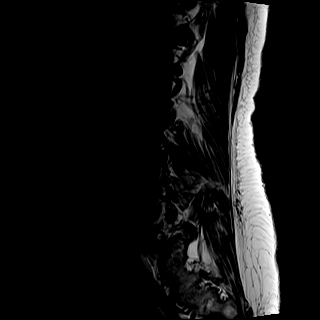
[im 3/15]
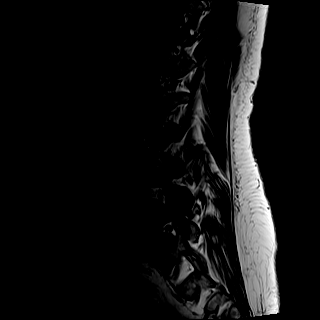
[im 6/15]
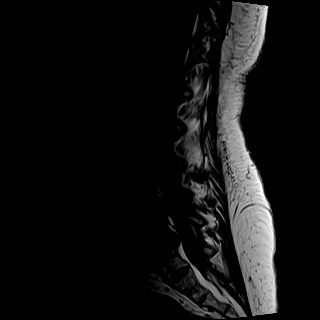
[im 9/15]
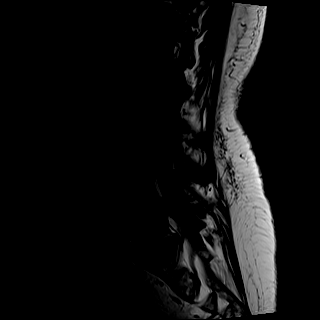
[im 12/15]
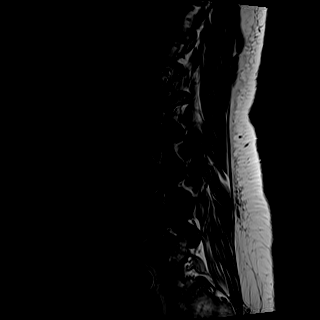
[im 15/15]
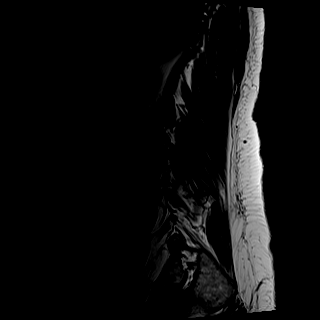

[Series 7: STIR · sagittal · 4.0mm · 0.44mm/px · 1 of 15 slices shown]
[im 1/15]
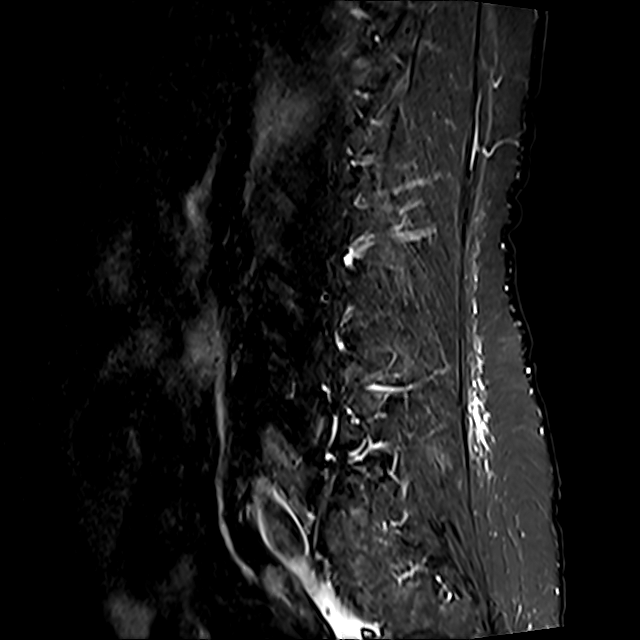

[Series 8: T2 · axial · 4.0mm · 0.86mm/px · z∈[-157,+65]mm · 9 of 36 slices shown (2 of 2)]
[im 1/36]
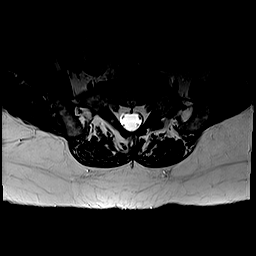
[im 6/36]
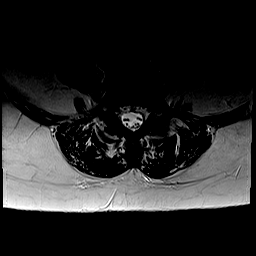
[im 11/36]
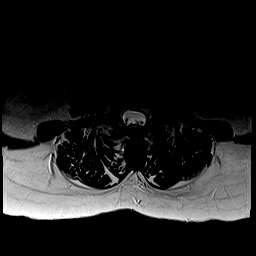
[im 16/36]
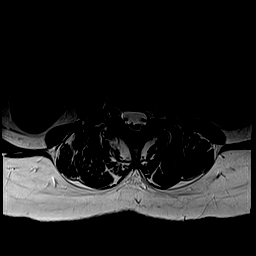
[im 18/36]
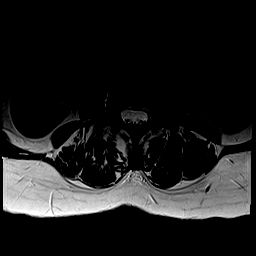
[im 21/36]
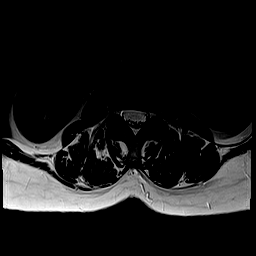
[im 26/36]
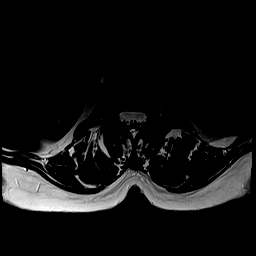
[im 31/36]
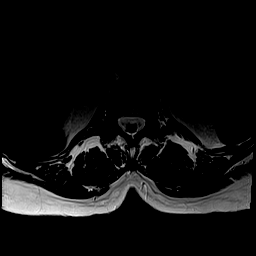
[im 36/36]
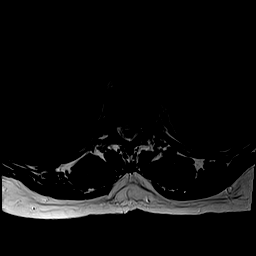

[Series 9: T1 · axial · 4.0mm · 0.43mm/px · z∈[-157,+65]mm · 9 of 36 slices shown (2 of 2)]
[im 1/36]
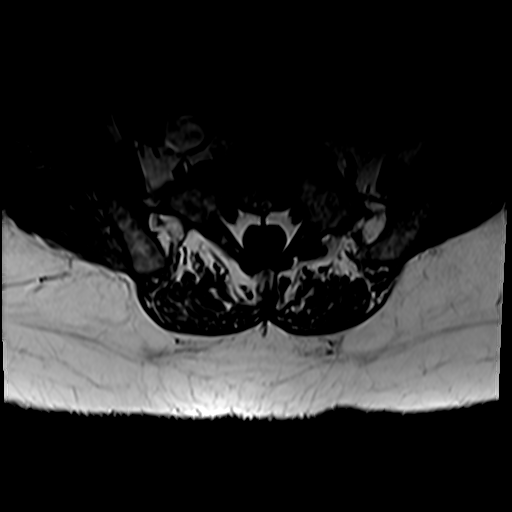
[im 6/36]
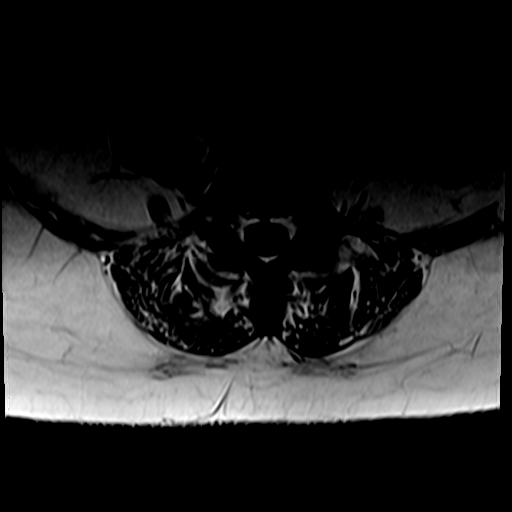
[im 11/36]
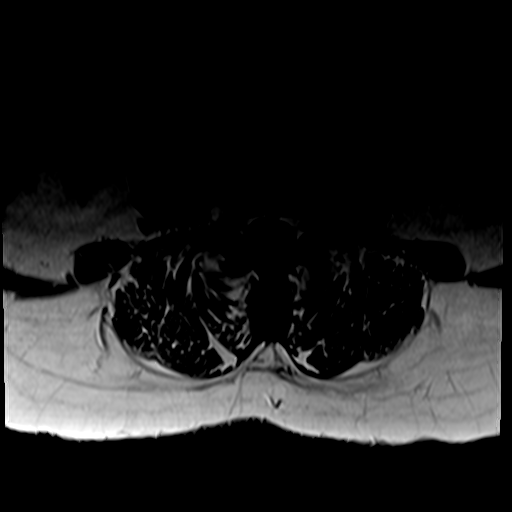
[im 16/36]
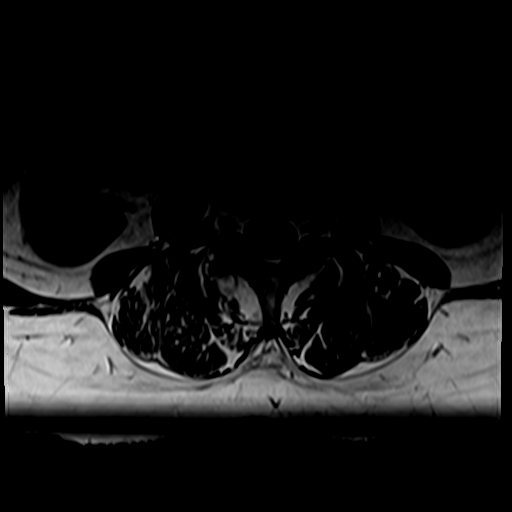
[im 18/36]
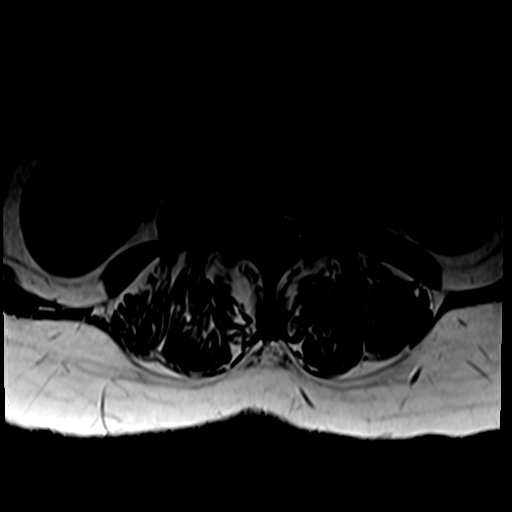
[im 21/36]
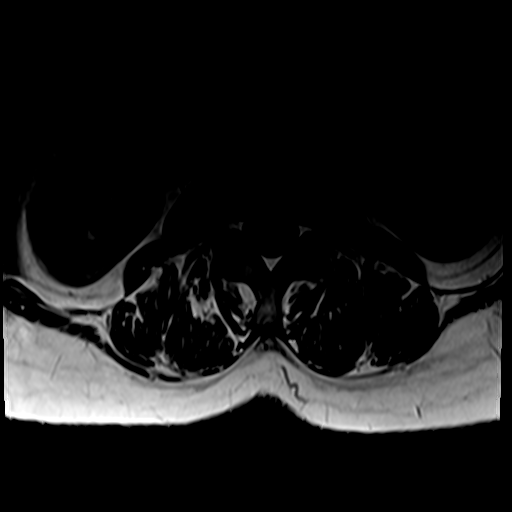
[im 26/36]
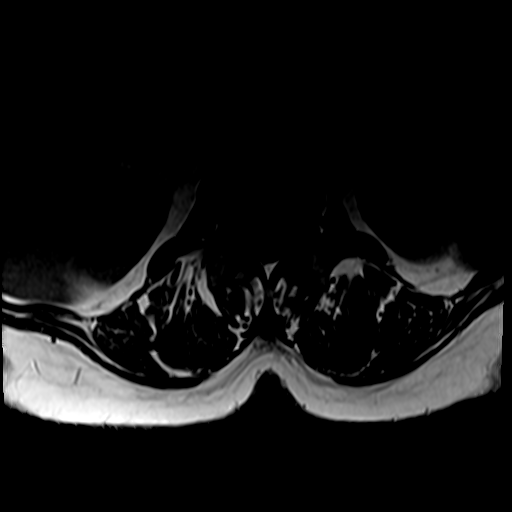
[im 31/36]
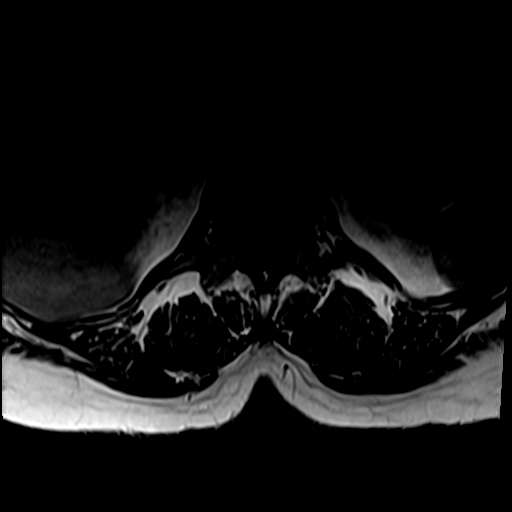
[im 36/36]
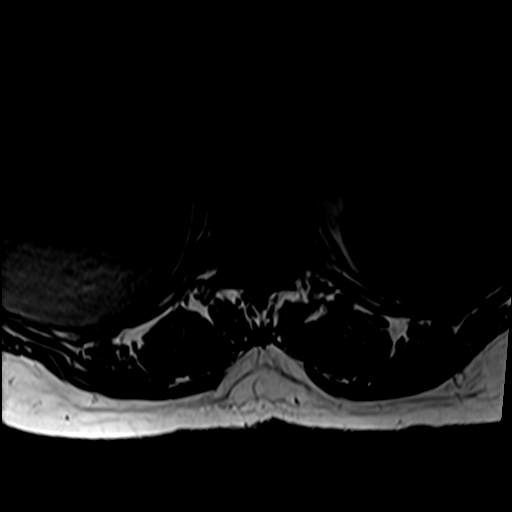

[31 of 48 positions shown; findings below may reference images not displayed]

FINDINGS: Segmentation:  Standard.

Alignment:  Physiologic.

Vertebrae:  Bilateral L4-5 facet edema.

Conus medullaris and cauda equina: Conus extends to the L2 level.
Conus and cauda equina appear normal.

Paraspinal and other soft tissues: Negative.

Disc levels:

T11-L3 levels are normal.

L3-L4: Disc desiccation without herniation. There is no spinal canal
stenosis. No neural foraminal stenosis.

L4-L5: Moderate facet hypertrophy. Small disc bulge. There is no
spinal canal stenosis. No neural foraminal stenosis.

L5-S1: Disc bulge. There is no spinal canal stenosis. Mild left
neural foraminal stenosis.

Visualized sacrum: Normal.
IMPRESSION: 1. L4-L5 moderate facet arthrosis with bilateral facet edema, which
may be a source of local low back pain.
2. Mild left L5-S1 neural foraminal stenosis.
3. No spinal canal stenosis.

## 2019-12-25 ENCOUNTER — Other Ambulatory Visit: Payer: Self-pay | Admitting: Physician Assistant

## 2019-12-25 DIAGNOSIS — G8929 Other chronic pain: Secondary | ICD-10-CM

## 2019-12-25 DIAGNOSIS — R9389 Abnormal findings on diagnostic imaging of other specified body structures: Secondary | ICD-10-CM

## 2019-12-30 ENCOUNTER — Telehealth: Payer: Self-pay | Admitting: Internal Medicine

## 2019-12-30 NOTE — Telephone Encounter (Signed)
Please follow up with pt. Referral was placed

## 2019-12-30 NOTE — Telephone Encounter (Signed)
Copied from CRM [id number]. Topic: General - Other >> Dec 30, 2019  2:10 PM Dalphine Handing A wrote: Patient is calling back to inform she is interested in referral appointment with [facility] and would like a callback once referral is placed.

## 2019-12-31 NOTE — Telephone Encounter (Signed)
Per Patient decision  Sent Referral to [facility] ph. # [phone] .They will contact the patient to schedule an appointment and I will follow up. Sent Referral and Notes in [employer] [facility]

## 2019-12-31 NOTE — Progress Notes (Signed)
Per Patient decision  Sent Referral to [facility] ph. # [phone] .They will contact the patient to schedule an appointment and I will follow up. Sent Referral and Notes in [employer] [facility]  

## 2020-01-06 NOTE — Telephone Encounter (Signed)
Pt was given [facility] phone number and she will reach out to them

## 2020-01-14 DIAGNOSIS — M1990 Unspecified osteoarthritis, unspecified site: Secondary | ICD-10-CM | POA: Insufficient documentation

## 2020-01-14 DIAGNOSIS — E669 Obesity, unspecified: Secondary | ICD-10-CM | POA: Insufficient documentation

## 2020-01-27 ENCOUNTER — Other Ambulatory Visit: Payer: Self-pay

## 2020-01-27 ENCOUNTER — Ambulatory Visit: Payer: Self-pay | Attending: Family Medicine | Admitting: Family Medicine

## 2020-01-27 ENCOUNTER — Other Ambulatory Visit: Payer: Self-pay | Admitting: Family Medicine

## 2020-01-27 ENCOUNTER — Encounter: Payer: Self-pay | Admitting: Family Medicine

## 2020-01-27 VITALS — BP 120/74 | HR 97 | Ht 69.0 in | Wt 217.0 lb

## 2020-01-27 DIAGNOSIS — E039 Hypothyroidism, unspecified: Secondary | ICD-10-CM | POA: Insufficient documentation

## 2020-01-27 DIAGNOSIS — G4700 Insomnia, unspecified: Secondary | ICD-10-CM | POA: Insufficient documentation

## 2020-01-27 DIAGNOSIS — E038 Other specified hypothyroidism: Secondary | ICD-10-CM

## 2020-01-27 DIAGNOSIS — Z1159 Encounter for screening for other viral diseases: Secondary | ICD-10-CM

## 2020-01-27 DIAGNOSIS — R0982 Postnasal drip: Secondary | ICD-10-CM

## 2020-01-27 DIAGNOSIS — G4709 Other insomnia: Secondary | ICD-10-CM

## 2020-01-27 DIAGNOSIS — M4800 Spinal stenosis, site unspecified: Secondary | ICD-10-CM | POA: Insufficient documentation

## 2020-01-27 MED ORDER — METHOCARBAMOL 500 MG PO TABS: 1000.0000 mg | ORAL_TABLET | Freq: Three times a day (TID) | ORAL | 1 refills | Status: DC | PRN

## 2020-01-27 MED ORDER — GUAIFENESIN ER 600 MG PO TB12: 600.0000 mg | ORAL_TABLET | Freq: Two times a day (BID) | ORAL | 0 refills | Status: DC

## 2020-01-27 MED ORDER — MELOXICAM 15 MG PO TABS: 15.0000 mg | ORAL_TABLET | Freq: Every day | ORAL | 1 refills | Status: DC

## 2020-01-27 MED ORDER — TRAZODONE HCL 50 MG PO TABS: 50.0000 mg | ORAL_TABLET | Freq: Every evening | ORAL | 3 refills | Status: DC | PRN

## 2020-01-27 MED FILL — METHOCARBAMOL 500 MG TABS: 500 | 15 days supply | Qty: 90 | Fill #0

## 2020-01-27 MED FILL — traZODone HCL 50 MG TABS: 50 | 30 days supply | Qty: 30 | Fill #0

## 2020-01-27 MED FILL — MELOXICAM 15 MG TABLET: 15 | 30 days supply | Qty: 30 | Fill #0

## 2020-01-27 NOTE — Progress Notes (Signed)
Subjective:  Patient ID: Jessica Cooke, female    DOB: 1960/11/30  Age: 59 y.o. 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  Previously followed by Dr. Amada Kingfisher - [facility] until she lost her insurance. MRI of lumbar spine from 12/2019 revealed:  IMPRESSION: 1. L4-L5 moderate facet arthrosis with bilateral facet edema, which may be a source of local low back pain. 2. Mild left L5-S1 neural foraminal stenosis. 3. No spinal canal stenosis.  She was seen by [facility] and was informed since they do not take [facility] care they would refer her to have an epidural spinal injection.  Pain sometimes radiates down both lower extremities but today is mild 2/10.  She was prescribed a muscle relaxant and NSAID post unable to obtain this due to the cost of the pharmacy.  Last night had mucus in her chest and some postnasal drip but no sinus pressure or pain. She smokes 1 pack cig/day.  Denies presence of dyspnea, wheezing. Complains of insomnia; was previously on Ambien which she received from her PCP and used intermittently and at some other times would use lorazepam. Currently does not work.  Past Medical History:  Diagnosis Date  . Eczema   . Hypothyroid   . Thyroid disease     Past Surgical History:  Procedure Laterality Date  . ABDOMINAL HYSTERECTOMY      Family History  Problem Relation Age of Onset  . Hypertension Mother   . Hypertension Sister   . Hypertension Brother     Allergies  Allergen Reactions  . Dilaudid [Hydromorphone Hcl] Nausea And Vomiting  . Tramadol Nausea And Vomiting    Outpatient Medications Prior to Visit  Medication Sig Dispense Refill  . levothyroxine (SYNTHROID, LEVOTHROID) 100 MCG tablet Take 100 mcg by mouth daily before breakfast.    . LORazepam (ATIVAN) 0.5 MG  tablet Take 0.5 mg by mouth daily as needed for anxiety.   1  . meloxicam (MOBIC) 15 MG tablet Take 1 tablet (15 mg total) by mouth daily. (Patient not taking: Reported on 01/27/2020) 30 tablet 1  . methocarbamol (ROBAXIN) 500 MG tablet Take 2 tablets (1,000 mg total) by mouth every 8 (eight) hours as needed for muscle spasms. (Patient not taking: Reported on 01/27/2020) 90 tablet 0   No facility-administered medications prior to visit.     ROS Review of Systems  Constitutional: Negative for activity change, appetite change and fatigue.  HENT: Negative for congestion, sinus pressure and sore throat.   Eyes: Negative for visual disturbance.  Respiratory: Negative for cough, chest tightness, shortness of breath and wheezing.   Cardiovascular: Negative for chest pain and palpitations.  Gastrointestinal: Negative for abdominal distention, abdominal pain and constipation.  Endocrine: Negative for polydipsia.  Genitourinary: Negative for dysuria and frequency.  Musculoskeletal: Positive for back pain. Negative for arthralgias.  Skin: Negative for rash.  Neurological: Negative for tremors, light-headedness and numbness.  Hematological: Does not bruise/bleed easily.  Psychiatric/Behavioral: Positive for sleep disturbance. Negative for agitation and behavioral problems.    Objective:  BP 120/74   Pulse 97   Ht 5\' 9"  (1.753 m)   Wt 217 lb (98.4 kg)   SpO2 95%   BMI 32.05 kg/m   BP/Weight 01/27/2020 11/06/2019 02/04/2019  Systolic BP 120 119 133  Diastolic BP 74 91 95  Wt. (Lbs) 217 - -  BMI 32.05 - -      Physical Exam Constitutional:      Appearance: She is well-developed.  HENT:     Right Ear: Tympanic membrane normal.     Left Ear: Tympanic membrane normal.     Mouth/Throat:     Mouth: Mucous membranes are moist.  Neck:     Vascular: No JVD.  Cardiovascular:     Rate and Rhythm: Normal rate.     Heart sounds: Normal heart sounds. No murmur heard.   Pulmonary:      Effort: Pulmonary effort is normal.     Breath sounds: Normal breath sounds. No wheezing or rales.  Chest:     Chest wall: No tenderness.  Abdominal:     General: Bowel sounds are normal. There is no distension.     Palpations: Abdomen is soft. There is no mass.     Tenderness: There is no abdominal tenderness.  Musculoskeletal:        General: Normal range of motion.     Right lower leg: No edema.     Left lower leg: No edema.     Comments: Negative straight leg raise bilaterally  Neurological:     Mental Status: She is alert and oriented to person, place, and time.  Psychiatric:        Mood and Affect: Mood normal.     CMP Latest Ref Rng & Units 09/12/2017 09/09/2016 09/06/2016  Glucose 65 - 99 mg/dL 366(Y) 81 93  BUN 6 - 20 mg/dL 9 10 11   Creatinine 0.44 - 1.00 mg/dL 4.03 4.74  Sodium 135 - 145 mmol/L 143 140 142  Potassium 3.5 - 5.1 mmol/L 3.7 4.0 3.5  Chloride 101 - 111 mmol/L 111 110 105  CO2 22 - 32 mmol/L 22 23 -  Calcium 8.9 - 10.3 mg/dL 9.5 9.1 -  Total Protein 6.5 - 8.1 g/dL 6.7 6.8 -  Total Bilirubin 0.3 - 1.2 mg/dL 0.5 0.5 -  Alkaline Phos 38 - 126 U/L 50 45 -  AST 15 - 41 U/L 19 19 -  ALT 14 - 54 U/L 12(L) 13(L) -    Lipid Panel     Component Value Date/Time   CHOL  06/30/2008 0634    115        ATP III CLASSIFICATION:  <200     mg/dL   Desirable  07/02/2008  mg/dL   Borderline High  563-875    mg/dL   High          TRIG 67 06/30/2008 0634   HDL 28 (L) 06/30/2008 0634   CHOLHDL 4.1 06/30/2008 0634   VLDL 13 06/30/2008 0634   LDLCALC  06/30/2008 0634    74        Total Cholesterol/HDL:CHD Risk Coronary Heart Disease Risk Table                     Men   Women  1/2 Average Risk   3.4   3.3  Average Risk       5.0   4.4  2 X Average Risk   9.6   7.1  3 X Average Risk  23.4   11.0        Use the calculated Patient Ratio above and the CHD Risk Table to determine the patient's CHD Risk.        ATP III CLASSIFICATION (LDL):  <100     mg/dL  Optimal  100-129  mg/dL   Near or Above                    Optimal  130-159  mg/dL   Borderline  834-196  mg/dL   High  >222     mg/dL   Very High    CBC    Component Value Date/Time   WBC 7.3 09/12/2017 1919   RBC 5.08 09/12/2017 1919   HGB 15.6 (H) 09/12/2017 1919   HCT 46.5 (H) 09/12/2017 1919   PLT 440 (H) 09/12/2017 1919   MCV 91.5 09/12/2017 1919   MCH 30.7 09/12/2017 1919   MCHC 33.5 09/12/2017 1919   RDW 13.9 09/12/2017 1919   LYMPHSABS 3.2 09/12/2017 1919   MONOABS 0.4 09/12/2017 1919   EOSABS 0.2 09/12/2017 1919   BASOSABS 0.1 09/12/2017 1919    No results found for: HGBA1C  Assessment & Plan:  1. Other specified hypothyroidism We will send of thyroid panel and adjust regimen accordingly - T4, free - TSH - Basic Metabolic Panel  2. Other insomnia Uncontrolled Previously on Ambien which she has run out of; took this intermittently and also took lorazepam from previous PCP Advised on adverse effects of short acting benzos and risk of dependence She never tried trazodone and is willing to try this today Sleep hygiene - traZODone (DESYREL) 50 MG tablet; Take 1 tablet (50 mg total) by mouth at bedtime as needed for sleep.  Dispense: 30 tablet; Refill: 3  3. Spinal stenosis, unspecified spinal region Uncontrolled Seen by spine specialist with plan for corticosteroid injection She will be calling her spine specialist back to set up an appointment - methocarbamol (ROBAXIN) 500 MG tablet; Take 2 tablets (1,000 mg total) by mouth every 8 (eight) hours as needed for muscle spasms.  Dispense: 90 tablet; Refill: 1 - meloxicam (MOBIC) 15 MG tablet; Take 1 tablet (15 mg total) by mouth daily.  Dispense: 30 tablet; Refill: 1  4. Post-nasal drip - guaiFENesin (MUCINEX) 600 MG 12 hr tablet; Take 1 tablet (600 mg total) by mouth 2 (two) times daily.  Dispense: 60 tablet; Refill: 0  5. Screening for viral disease - HCV RNA quant rflx ultra or genotyp([facility]) -  HIV Antibody (routine testing w rflx)    Meds ordered this encounter  Medications  . methocarbamol (ROBAXIN) 500 MG tablet    Sig: Take 2 tablets (1,000 mg total) by mouth every 8 (eight) hours as needed for muscle spasms.    Dispense:  90 tablet    Refill:  1  . meloxicam (MOBIC) 15 MG tablet    Sig: Take 1 tablet (15 mg total) by mouth daily.    Dispense:  30 tablet    Refill:  1  . guaiFENesin (MUCINEX) 600 MG 12 hr tablet    Sig: Take 1 tablet (600 mg total) by mouth 2 (two) times daily.    Dispense:  60 tablet    Refill:  0  . traZODone (DESYREL) 50 MG tablet    Sig: Take 1 tablet (50 mg total) by mouth at bedtime as needed for sleep.    Dispense:  30 tablet    Refill:  3    Follow-up: Return in about 3 months (around 04/28/2020) for Chronic disease management.       Hoy Register, MD, FAAFP. [facility] [place], [place] [phone]   01/27/2020, 3:31 PM

## 2020-01-27 NOTE — Progress Notes (Signed)
States that she feel like she is getting a cold coughing up mucus.  Wants to discuss upcoming injection in back.  Has not been sleeping well.

## 2020-01-27 NOTE — Patient Instructions (Signed)
Spinal Stenosis  Spinal stenosis happens when the open space (spinal canal) between the bones of your spine (vertebrae) gets smaller. It is caused by bone pushing into the open spaces of your backbone (spine). This puts pressure on your backbone and the nerves in your backbone. Treatment often focuses on managing any pain and symptoms. In some cases, surgery may be needed. Follow these instructions at home: Managing pain, stiffness, and swelling   Do all exercises and stretches as told by your doctor.  Stand and sit up straight (use good posture). If you were given a brace or a corset, wear it as told by your doctor.  Do not do any activities that cause pain. Ask your doctor what activities are safe for you.  Do not lift anything that is heavier than 10 lb (4.5 kg) or heavier than your doctor tells you.  Try to stay at a healthy weight. Talk with your doctor if you need help losing weight.  If directed, put heat on the affected area as often as told by your doctor. Use the heat source that your doctor recommends, such as a moist heat pack or a heating pad. ? Put a towel between your skin and the heat source. ? Leave the heat on for 20-30 minutes. ? Remove the heat if your skin turns bright red. This is especially important if you are not able to feel pain, heat, or cold. You may have a greater risk of getting burned. General instructions  Take over-the-counter and prescription medicines only as told by your doctor.  Do not use any products that contain nicotine or tobacco, such as cigarettes and e-cigarettes. If you need help quitting, ask your doctor.  Eat a healthy diet. This includes plenty of fruits and vegetables, whole grains, and low-fat (lean) protein.  Keep all follow-up visits as told by your doctor. This is important. Contact a doctor if:  Your symptoms do not get better.  Your symptoms get worse.  You have a fever. Get help right away if:  You have new or worse pain  in your neck or upper back.  You have very bad pain that medicine does not control.  You are dizzy.  You have vision problems, blurred vision, or double vision.  You have a very bad headache that is worse when you stand.  You feel sick to your stomach (nauseous).  You throw up (vomit).  You have new or worse numbness or tingling in your back or legs.  You have pain, redness, swelling, or warmth in your arm or leg. Summary  Spinal stenosis happens when the open space (spinal canal) between the bones of your spine gets smaller (narrow).  Contact a doctor if your symptoms get worse.  In some cases, surgery may be needed. This information is not intended to replace advice given to you by your health care provider. Make sure you discuss any questions you have with your health care provider. Document Revised: 03/17/2017 Document Reviewed: 03/09/2016 Elsevier Patient Education  2020 [employer]  

## 2020-01-28 ENCOUNTER — Other Ambulatory Visit: Payer: Self-pay | Admitting: Neurosurgery

## 2020-01-28 DIAGNOSIS — M4726 Other spondylosis with radiculopathy, lumbar region: Secondary | ICD-10-CM

## 2020-01-28 LAB — BASIC METABOLIC PANEL
BUN/Creatinine Ratio: 10 — ABNORMAL LOW (ref 12–28)
BUN: 8 mg/dL (ref 8–27)
CO2: 19 mmol/L — ABNORMAL LOW (ref 20–29)
Calcium: 10.4 mg/dL — ABNORMAL HIGH (ref 8.7–10.3)
Chloride: 106 mmol/L (ref 96–106)
Creatinine, Ser: 0.84 mg/dL (ref 0.57–1.00)
GFR calc Af Amer: 87 mL/min/{1.73_m2} (ref >59)
GFR calc non Af Amer: 75 mL/min/{1.73_m2} (ref >59)
Glucose: 109 mg/dL — ABNORMAL HIGH (ref 65–99)
Potassium: 4.3 mmol/L (ref 3.5–5.2)
Sodium: 142 mmol/L (ref 134–144)

## 2020-01-28 LAB — HIV ANTIBODY (ROUTINE TESTING W REFLEX): HIV Screen 4th Generation wRfx: NONREACTIVE

## 2020-01-28 LAB — HCV RNA QUANT RFLX ULTRA OR GENOTYP: HCV Quant Baseline: NOT DETECTED IU/mL

## 2020-01-28 LAB — TSH: TSH: 3.22 u[IU]/mL (ref 0.450–4.500)

## 2020-01-28 LAB — T4, FREE: Free T4: 1.48 ng/dL (ref 0.82–1.77)

## 2020-01-29 ENCOUNTER — Other Ambulatory Visit: Payer: Self-pay | Admitting: Family Medicine

## 2020-01-29 MED ORDER — LEVOTHYROXINE SODIUM 100 MCG PO TABS: 100.0000 ug | ORAL_TABLET | Freq: Every day | ORAL | 1 refills | Status: DC

## 2020-01-31 ENCOUNTER — Telehealth: Payer: Self-pay | Admitting: Family Medicine

## 2020-01-31 ENCOUNTER — Telehealth: Payer: Self-pay

## 2020-01-31 NOTE — Telephone Encounter (Signed)
-----   Message from Hoy Register, MD sent at 01/29/2020  2:57 PM EDT ----- Please inform her labs are stable.  Thyroid panel is normal

## 2020-01-31 NOTE — Telephone Encounter (Signed)
° °   Called pt to schedule an appt if needed. Pt said she is fine for now. Pt will call if she needs one.

## 2020-01-31 NOTE — Telephone Encounter (Signed)
Patient was seen on 01/27/20 with [facility], she had some congestion in chest but no cough at the time of visit.

## 2020-01-31 NOTE — Telephone Encounter (Signed)
Patient name and DOB has been verified Patient was informed of lab results. Patient had no questions.  

## 2020-02-04 ENCOUNTER — Other Ambulatory Visit: Payer: Self-pay

## 2020-02-04 ENCOUNTER — Other Ambulatory Visit: Payer: Self-pay | Admitting: Neurosurgery

## 2020-02-04 ENCOUNTER — Ambulatory Visit
Admission: RE | Admit: 2020-02-04 | Discharge: 2020-02-04 | Disposition: A | Discharge status: Home / Self Care | Payer: Self-pay | Source: Ambulatory Visit | Attending: Neurosurgery | Admitting: Neurosurgery

## 2020-02-04 DIAGNOSIS — M4726 Other spondylosis with radiculopathy, lumbar region: Secondary | ICD-10-CM

## 2020-02-04 IMAGING — XA DG FACET JT INJ L OR S SPINE SINGLE LEVEL UNI
2 series · 2 of 2 positions shown · non-contrast
Comparison: none

CLINICAL DATA: Lumbosacral spondylosis without myelopathy.
Bilateral low back pain. Facet arthropathy at L4-L5 with marrow
edema. Bilateral L4-L5 facet injections requested.

[Series 1: ortho adipose · 1 of 1 slices shown (1 of 2)]
[im 1/1]
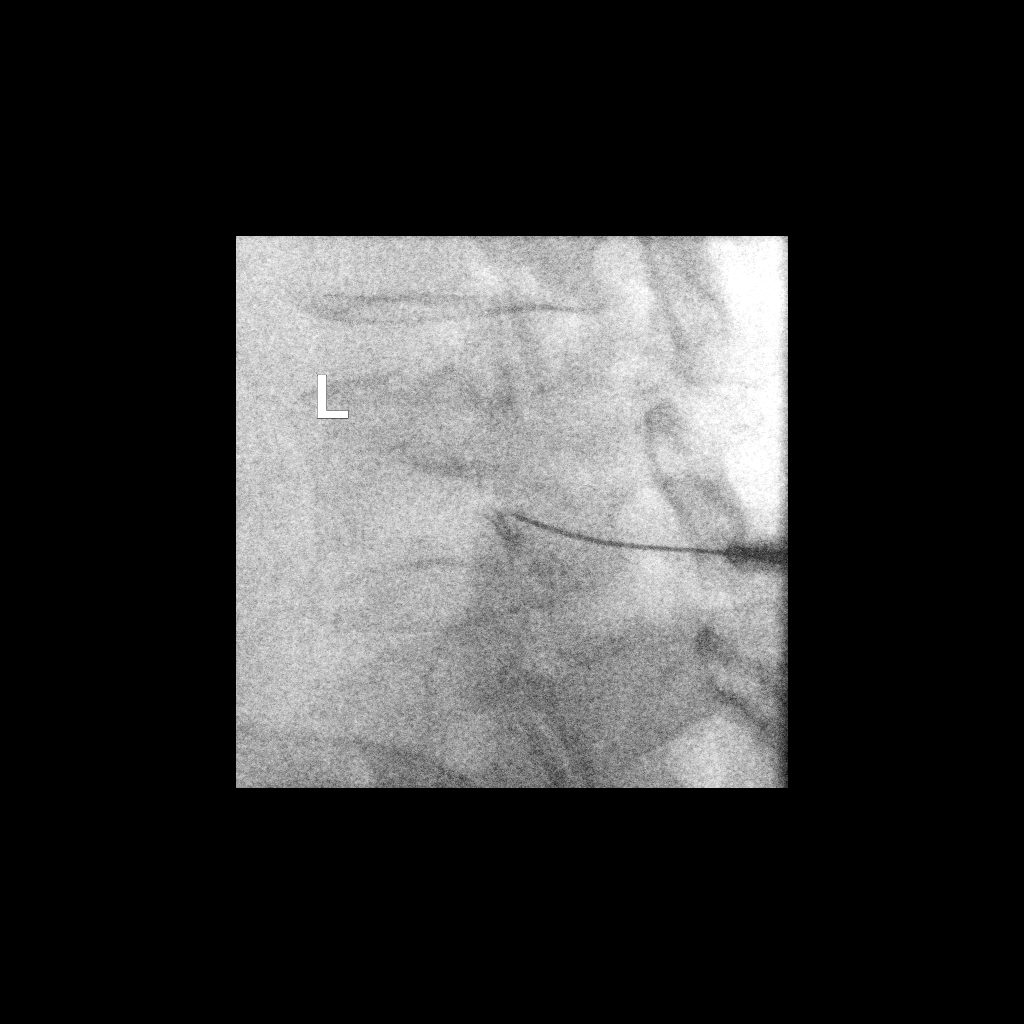

[Series 2: ortho adipose · 1 of 1 slices shown (2 of 2)]
[im 1/1]
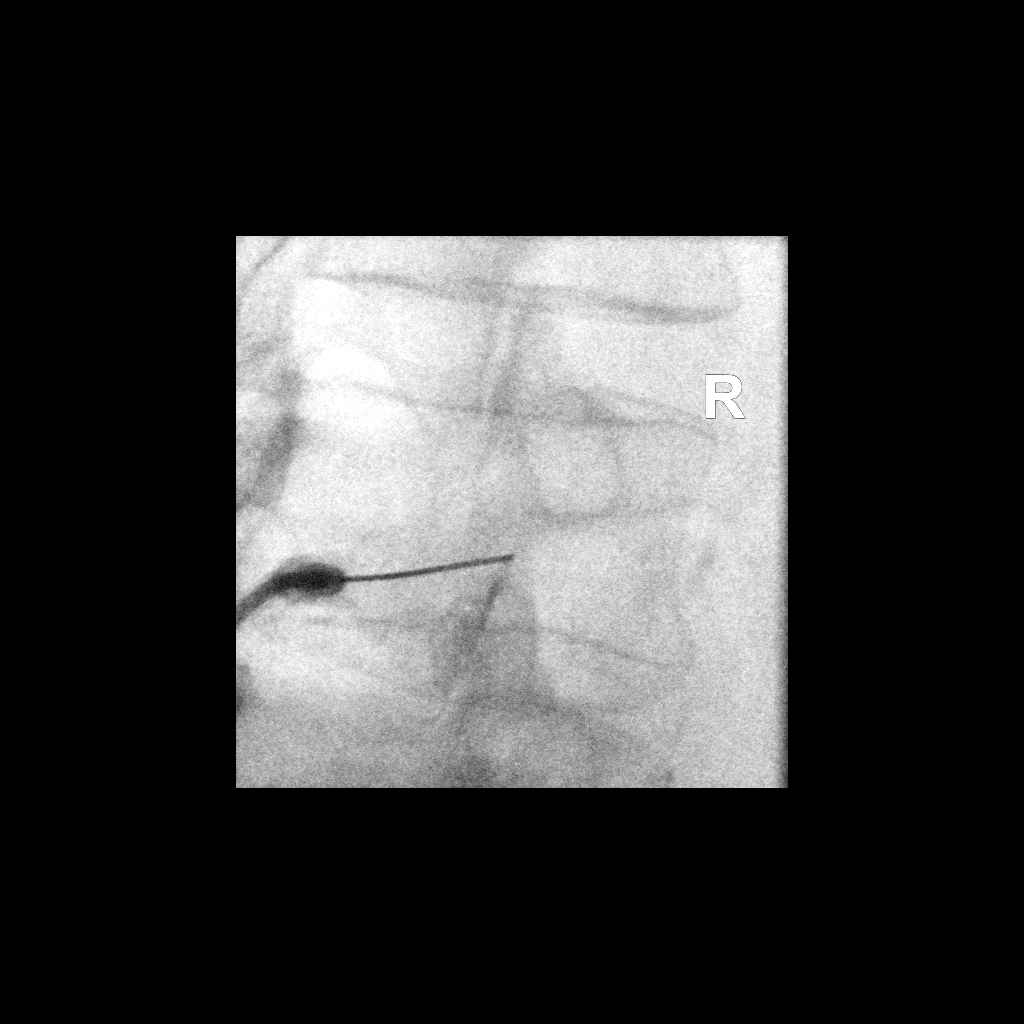

[2 of 2 positions shown; findings below may reference images not displayed]

EXAM:
DG FACET JT INJ L OR S SPINE SINGLE LEVEL UNILATERAL

FLUOROSCOPY TIME:  Radiation Exposure Index (as provided by the
fluoroscopic device): 10.4 mGy

Fluoroscopy Time:  1 minute, 6 seconds

Number of Acquired Images:  0

PROCEDURE:
The procedure, risks, benefits, and alternatives were explained to
the patient. Questions regarding the procedure were encouraged and
answered. The patient understands and consents to the procedure.

LEFT L4-L5 FACET INJECTION: A posterior oblique approach was taken
to the facet on the left at L4-L5 using a curved 3.5 inch 22 gauge
spinal needle. Intra-articular positioning was confirmed by
injecting a small amount of Isovue-M 200. No vascular opacification
is seen. 60 mg of Depo-Medrol mixed with 0.75 mL of 0.25%
bupivacaine were instilled into the joint. The injection resulted in
concordant pain.

RIGHT L4-L5 FACET INJECTION: A posterior oblique approach was taken
to the facet on the right at L4-L5 using a curved 3.5 inch 22 gauge
spinal needle. Intra-articular positioning was confirmed by
injecting a small amount of Isovue-M 200. No vascular opacification
is seen. 60 mg of Depo-Medrol mixed with 0.75 mL of 0.25%
bupivacaine were instilled into the joint. The injection resulted in
concordant pain.

The procedure was well-tolerated.
IMPRESSION: Technically successful bilateral L4-L5 facet injections.

## 2020-02-04 MED ORDER — IOPAMIDOL (ISOVUE-M 200) INJECTION 41%
1.0000 mL | Freq: Once | INTRAMUSCULAR | Status: AC
  Administered 2020-02-04: 1 mL via INTRA_ARTICULAR

## 2020-02-04 MED ORDER — METHYLPREDNISOLONE ACETATE 40 MG/ML INJ SUSP (RADIOLOG
120.0000 mg | Freq: Once | INTRAMUSCULAR | Status: AC
  Administered 2020-02-04: 120 mg via INTRA_ARTICULAR

## 2020-02-04 NOTE — Discharge Instructions (Signed)

## 2020-07-22 ENCOUNTER — Encounter (INDEPENDENT_AMBULATORY_CARE_PROVIDER_SITE_OTHER): Payer: Self-pay

## 2020-07-22 ENCOUNTER — Ambulatory Visit (INDEPENDENT_AMBULATORY_CARE_PROVIDER_SITE_OTHER): Payer: Self-pay | Admitting: Primary Care

## 2020-07-23 ENCOUNTER — Other Ambulatory Visit: Payer: Self-pay

## 2020-07-23 ENCOUNTER — Ambulatory Visit (INDEPENDENT_AMBULATORY_CARE_PROVIDER_SITE_OTHER): Payer: Self-pay | Admitting: Primary Care

## 2020-07-23 ENCOUNTER — Encounter (INDEPENDENT_AMBULATORY_CARE_PROVIDER_SITE_OTHER): Payer: Self-pay | Admitting: Primary Care

## 2020-07-23 VITALS — BP 120/77 | HR 93 | Temp 97.2°F | Resp 16 | Wt 218.0 lb

## 2020-07-23 DIAGNOSIS — Z1231 Encounter for screening mammogram for malignant neoplasm of breast: Secondary | ICD-10-CM

## 2020-07-23 DIAGNOSIS — M4800 Spinal stenosis, site unspecified: Secondary | ICD-10-CM

## 2020-07-23 DIAGNOSIS — Z716 Tobacco abuse counseling: Secondary | ICD-10-CM

## 2020-07-23 DIAGNOSIS — F17209 Nicotine dependence, unspecified, with unspecified nicotine-induced disorders: Secondary | ICD-10-CM

## 2020-07-23 DIAGNOSIS — Z1211 Encounter for screening for malignant neoplasm of colon: Secondary | ICD-10-CM

## 2020-07-23 DIAGNOSIS — Z23 Encounter for immunization: Secondary | ICD-10-CM

## 2020-07-23 DIAGNOSIS — G4709 Other insomnia: Secondary | ICD-10-CM

## 2020-07-23 DIAGNOSIS — E038 Other specified hypothyroidism: Secondary | ICD-10-CM

## 2020-07-23 MED ORDER — METHOCARBAMOL 500 MG PO TABS
ORAL_TABLET | Freq: Three times a day (TID) | ORAL | 1 refills | Status: DC | PRN
  Filled 2020-07-23: qty 90, 15d supply, fill #0
  Filled 2020-09-23 – 2020-10-08 (×2): qty 90, 15d supply, fill #1

## 2020-07-23 MED ORDER — NICOTINE 21 MG/24HR TD PT24
21.0000 mg | MEDICATED_PATCH | TRANSDERMAL | 0 refills | Status: DC
  Filled 2020-07-23: qty 28, 28d supply, fill #0

## 2020-07-23 NOTE — Progress Notes (Signed)
Acute Office Visit  Subjective:    Patient ID: Jessica Cooke, female    DOB: [birth year], 60 y.o.   MRN: [id number]  No chief complaint on file.   HPI Ms. Jessica Cooke is a 60 year old obese female seen  today for an acute visit she has been having lower back pain for greater than 3 weeks pain is worse with activities such as bending, reaching, lifting and going up steps.  She rates her pain 4 out of 10.  She is also having sharp shooting pains in her lower back. Past Medical History:  Diagnosis Date  . Eczema   . Hypothyroid   . Thyroid disease     Past Surgical History:  Procedure Laterality Date  . ABDOMINAL HYSTERECTOMY      Family History  Problem Relation Age of Onset  . Hypertension Mother   . Hypertension Sister   . Hypertension Brother     Social History   Socioeconomic History  . Marital status: Married    Spouse name: Not on file  . Number of children: Not on file  . Years of education: Not on file  . Highest education level: Not on file  Occupational History  . Not on file  Tobacco Use  . Smoking status: Current Every Day Smoker    Packs/day: 1.00    Types: Cigarettes  . Smokeless tobacco: Never Used  Substance and Sexual Activity  . Alcohol use: Yes    Comment: rarely  . Drug use: No  . Sexual activity: Not on file  Other Topics Concern  . Not on file  Social History Narrative  . Not on file   Social Determinants of Health   Financial Resource Strain: Not on file  Food Insecurity: Not on file  Transportation Needs: Not on file  Physical Activity: Not on file  Stress: Not on file  Social Connections: Not on file  Intimate Partner Violence: Not on file    Outpatient Medications Prior to Visit  Medication Sig Dispense Refill  . levothyroxine (SYNTHROID) 100 MCG tablet Take 1 tablet (100 mcg total) by mouth daily before breakfast. 90 tablet 1  . meloxicam (MOBIC) 15 MG tablet TAKE 1 TABLET (15 MG TOTAL) BY MOUTH DAILY. (Patient not  taking: Reported on 07/23/2020) 30 tablet 1  . guaiFENesin (MUCINEX) 600 MG 12 hr tablet Take 1 tablet (600 mg total) by mouth 2 (two) times daily. (Patient not taking: Reported on 07/23/2020) 60 tablet 0  . methocarbamol (ROBAXIN) 500 MG tablet TAKE 2 TABLETS (1,000 MG TOTAL) BY MOUTH EVERY 8 (EIGHT) HOURS AS NEEDED FOR MUSCLE SPASMS. (Patient not taking: Reported on 07/23/2020) 90 tablet 1  . traZODone (DESYREL) 50 MG tablet TAKE 1 TABLET (50 MG TOTAL) BY MOUTH AT BEDTIME AS NEEDED FOR SLEEP. (Patient not taking: Reported on 07/23/2020) 30 tablet 3   No facility-administered medications prior to visit.    Allergies  Allergen Reactions  . Dilaudid [Hydromorphone Hcl] Nausea And Vomiting  . Tramadol Nausea And Vomiting    Review of Systems  Gastrointestinal: Positive for abdominal distention.  Musculoskeletal: Positive for back pain.  All other systems reviewed and are negative.      Objective:    Physical Exam Constitutional:      Appearance: Normal appearance. She is obese.  HENT:     Head: Normocephalic.     Right Ear: Tympanic membrane and external ear normal.     Left Ear: Tympanic membrane and external ear normal.  Nose: Nose normal.  Eyes:     Extraocular Movements: Extraocular movements intact.     Pupils: Pupils are equal, round, and reactive to light.  Cardiovascular:     Rate and Rhythm: Normal rate and regular rhythm.  Pulmonary:     Effort: Pulmonary effort is normal.     Breath sounds: Normal breath sounds.  Abdominal:     General: Bowel sounds are normal.     Palpations: Abdomen is soft.  Musculoskeletal:        General: Normal range of motion.     Cervical back: Normal range of motion.  Skin:    General: Skin is warm and dry.  Neurological:     Mental Status: She is alert and oriented to person, place, and time.  Psychiatric:        Mood and Affect: Mood normal.        Behavior: Behavior normal.        Thought Content: Thought content normal.         Judgment: Judgment normal.     BP 120/77 (BP Location: Left Arm, Patient Position: Sitting, Cuff Size: Large)   Pulse 93   Temp (!) 97.2 F (36.2 C)   Resp 16   Wt 218 lb (98.9 kg)   SpO2 94%   BMI 32.19 kg/m  Wt Readings from Last 3 Encounters:  07/23/20 218 lb (98.9 kg)  01/27/20 217 lb (98.4 kg)  05/23/18 214 lb (97.1 kg)    Health Maintenance Due  Topic Date Due  . PAP SMEAR-Modifier  Never done  . COLONOSCOPY (Pts 45-1yrs Insurance coverage will need to be confirmed)  Never done  . MAMMOGRAM  Never done  . COVID-19 Vaccine (3 - Booster for Pfizer series) 05/15/2020    There are no preventive care reminders to display for this patient.   Lab Results  Component Value Date   TSH 4.640 (H) 07/23/2020   Lab Results  Component Value Date   WBC 7.3 09/12/2017   HGB 15.6 (H) 09/12/2017   HCT 46.5 (H) 09/12/2017   MCV 91.5 09/12/2017   PLT 440 (H) 09/12/2017   Lab Results  Component Value Date   NA 142 01/27/2020   K 4.3 01/27/2020   CO2 19 (L) 01/27/2020   GLUCOSE 109 (H) 01/27/2020   BUN 8 01/27/2020   CREATININE 0.84 01/27/2020   BILITOT 0.5 09/12/2017   ALKPHOS 50 09/12/2017   AST 19 09/12/2017   ALT 12 (L) 09/12/2017   PROT 6.7 09/12/2017   ALBUMIN 4.3 09/12/2017   CALCIUM 10.4 (H) 01/27/2020   ANIONGAP 10 09/12/2017   Lab Results  Component Value Date   CHOL  06/30/2008    115        ATP III CLASSIFICATION:  <200     mg/dL   Desirable  696-789  mg/dL   Borderline High  >=381    mg/dL   High          Lab Results  Component Value Date   HDL 28 (L) 06/30/2008   Lab Results  Component Value Date   LDLCALC  06/30/2008    74        Total Cholesterol/HDL:CHD Risk Coronary Heart Disease Risk Table                     Men   Women  1/2 Average Risk   3.4   3.3  Average Risk       5.0  4.4  2 X Average Risk   9.6   7.1  3 X Average Risk  23.4   11.0        Use the calculated Patient Ratio above and the CHD Risk Table to determine the  patient's CHD Risk.        ATP III CLASSIFICATION (LDL):  <100     mg/dL   Optimal  518-841  mg/dL   Near or Above                    Optimal  130-159  mg/dL   Borderline  660-630  mg/dL   High  >160     mg/dL   Very High   Lab Results  Component Value Date   TRIG 67 06/30/2008   Lab Results  Component Value Date   CHOLHDL 4.1 06/30/2008   No results found for: HGBA1C     Assessment & Plan:  Diagnoses and all orders for this visit:  Other specified hypothyroidism -     TSH + free T4  Spinal stenosis, unspecified spinal region Patient denies any pain radiating down her legs, paresthesias, saddle paresthesias, urinary incontinence, urinary retention, bowel incontinence, no IV drug use, weight changes -     methocarbamol (ROBAXIN) 500 MG tablet; TAKE 2 TABLETS (1,000 MG TOTAL) BY MOUTH EVERY 8 (EIGHT) HOURS AS NEEDED FOR MUSCLE SPASMS.  Other insomnia Advised that she can try melatonin 5mg -15 mg at night for sleep, can also do benadryl 25-50mg  at night for sleep.  Also here is some information about good sleep hygiene.  If these regimens are not effective follow-up with PCP  Encounter for screening mammogram for malignant neoplasm of breast -     MM Digital Screening; Future  Tobacco abuse counseling Counseled on nicotine affect every organ in the body second leading cause of death.  Increased risk for lung cancer , other respiratory diseases risk for high blood pressure.  After discussion patient agreed to start cessation with trying nicotine patches -     nicotine (NICODERM CQ) 21 mg/24hr patch; Place 1 patch (21 mg total) onto the skin daily.  Colon cancer screening Recommended colon cancer screening age 1-75. This screening is used for a disease when no symptoms are present . Diagnostic test is used for symptoms examples blood in stool, colorectal polyps or coloector cancer, family history or inflammatory bowel disease -  Return to community health and wellness -      Fecal occult blood, imunochemical; Future  Other orders -     Tdap vaccine greater than or equal to 7yo IM     Meds ordered this encounter  Medications  . methocarbamol (ROBAXIN) 500 MG tablet    Sig: TAKE 2 TABLETS (1,000 MG TOTAL) BY MOUTH EVERY 8 (EIGHT) HOURS AS NEEDED FOR MUSCLE SPASMS.    Dispense:  90 tablet    Refill:  1  . nicotine (NICODERM CQ) 21 mg/24hr patch    Sig: Place 1 patch (21 mg total) onto the skin daily.    Dispense:  28 patch    Refill:  0     51-68, NP

## 2020-07-23 NOTE — Patient Instructions (Signed)
Chronic Back Pain When back pain lasts longer than 3 months, it is called chronic back pain. Pain may get worse at certain times (flare-ups). There are things you can do at home to manage your pain. Follow these instructions at home: Pay attention to any changes in your symptoms. Take these actions to help with your pain: Managing pain and stiffness  If told, put ice on the painful area. Your doctor may tell you to use ice for 24-48 hours after the flare-up starts. To do this: ? Put ice in a plastic bag. ? Place a towel between your skin and the bag. ? Leave the ice on for 20 minutes, 2-3 times a day.  If told, put heat on the painful area. Do this as often as told by your doctor. Use the heat source that your doctor recommends, such as a moist heat pack or a heating pad. ? Place a towel between your skin and the heat source. ? Leave the heat on for 20-30 minutes. ? Take off the heat if your skin turns bright red. This is especially important if you are unable to feel pain, heat, or cold. You may have a greater risk of getting burned.  Soak in a warm bath. This can help relieve pain.      Activity  Avoid bending and other activities that make pain worse.  When standing: ? Keep your upper back and neck straight. ? Keep your shoulders pulled back. ? Avoid slouching.  When sitting: ? Keep your back straight. ? Relax your shoulders. Do not round your shoulders or pull them backward.  Do not sit or stand in one place for long periods of time.  Take short rest breaks during the day. Lying down or standing is usually better than sitting. Resting can help relieve pain.  When sitting or lying down for a long time, do some mild activity or stretching. This will help to prevent stiffness and pain.  Get regular exercise. Ask your doctor what activities are safe for you.  Do not lift anything that is heavier than 10 lb (4.5 kg) or the limit that you are told, until your doctor says that it  is safe.  To prevent injury when you lift things: ? Bend your knees. ? Keep the weight close to your body. ? Avoid twisting.  Sleep on a firm mattress. Try lying on your side with your knees slightly bent. If you lie on your back, put a pillow under your knees.   Medicines  Treatment may include medicines for pain and swelling taken by mouth or put on the skin, prescription pain medicine, or muscle relaxants.  Take over-the-counter and prescription medicines only as told by your doctor.  Ask your doctor if the medicine prescribed to you: ? Requires you to avoid driving or using machinery. ? Can cause trouble pooping (constipation). You may need to take these actions to prevent or treat trouble pooping:  Drink enough fluid to keep your pee (urine) pale yellow.  Take over-the-counter or prescription medicines.  Eat foods that are high in fiber. These include beans, whole grains, and fresh fruits and vegetables.  Limit foods that are high in fat and sugars. These include fried or sweet foods. General instructions  Do not use any products that contain nicotine or tobacco, such as cigarettes, e-cigarettes, and chewing tobacco. If you need help quitting, ask your doctor.  Keep all follow-up visits as told by your doctor. This is important. Contact a doctor if:    Your pain does not get better with rest or medicine.  Your pain gets worse, or you have new pain.  You have a high fever.  You lose weight very quickly.  You have trouble doing your normal activities. Get help right away if:  One or both of your legs or feet feel weak.  One or both of your legs or feet lose feeling (have numbness).  You have trouble controlling when you poop (have a bowel movement) or pee (urinate).  You have bad back pain and: ? You feel like you may vomit (nauseous), or you vomit. ? You have pain in your belly (abdomen). ? You have shortness of breath. ? You faint. Summary  When back pain  lasts longer than 3 months, it is called chronic back pain.  Pain may get worse at certain times (flare-ups).  Use ice and heat as told by your doctor. Your doctor may tell you to use ice after flare-ups. This information is not intended to replace advice given to you by your health care provider. Make sure you discuss any questions you have with your health care provider. Document Revised: 05/15/2019 Document Reviewed: 05/15/2019 Elsevier Patient Education  2021 Elsevier Inc.  

## 2020-07-23 NOTE — Progress Notes (Signed)
Lower back pain x 3 weeks  Intermittent pain with movement  pain 4/10   Would like refill on muscle relaxer

## 2020-07-24 LAB — TSH+FREE T4
Free T4: 1.52 ng/dL (ref 0.82–1.77)
TSH: 4.64 u[IU]/mL — ABNORMAL HIGH (ref 0.450–4.500)

## 2020-07-27 ENCOUNTER — Other Ambulatory Visit: Payer: Self-pay

## 2020-07-27 ENCOUNTER — Other Ambulatory Visit (INDEPENDENT_AMBULATORY_CARE_PROVIDER_SITE_OTHER): Payer: Self-pay

## 2020-07-27 DIAGNOSIS — Z1211 Encounter for screening for malignant neoplasm of colon: Secondary | ICD-10-CM

## 2020-07-29 ENCOUNTER — Telehealth (INDEPENDENT_AMBULATORY_CARE_PROVIDER_SITE_OTHER): Payer: Self-pay

## 2020-07-29 LAB — FECAL OCCULT BLOOD, IMMUNOCHEMICAL: Fecal Occult Bld: NEGATIVE

## 2020-07-29 NOTE — Telephone Encounter (Signed)
Patient is aware that thyroid is stable and to continue on current dose and take 30 mins prior to breakfast. Maryjean Morn, CMA

## 2020-07-29 NOTE — Telephone Encounter (Signed)
-----   Message from Grayce Sessions, NP sent at 07/26/2020  2:06 PM EDT ----- Thyroid level is stable continue 100 mcg 30 minutes prior to breakfast.  Follow-up with PCP

## 2020-08-03 ENCOUNTER — Telehealth (INDEPENDENT_AMBULATORY_CARE_PROVIDER_SITE_OTHER): Payer: Self-pay | Admitting: Primary Care

## 2020-08-03 NOTE — Telephone Encounter (Signed)
Pt is calling to speak to [facility] to schedule an appt for her CAFA application. CB- [phone]

## 2020-08-03 NOTE — Telephone Encounter (Signed)
Called Pt no answer and unable to leave vm.  

## 2020-08-05 ENCOUNTER — Telehealth (INDEPENDENT_AMBULATORY_CARE_PROVIDER_SITE_OTHER): Payer: Self-pay | Admitting: Primary Care

## 2020-08-05 NOTE — Telephone Encounter (Signed)
Pt had called in on 4/18 and is calling  back in re copied : Pt is calling to speak to [facility] to schedule an appt for her CAFA application. CB- [phone]" Pls fu pt and advise

## 2020-08-05 NOTE — Telephone Encounter (Signed)
I return Pt call and schedule aq financial appt for 08/11/20

## 2020-08-11 ENCOUNTER — Ambulatory Visit: Payer: Self-pay | Attending: Family Medicine

## 2020-08-11 ENCOUNTER — Other Ambulatory Visit: Payer: Self-pay

## 2020-08-14 ENCOUNTER — Other Ambulatory Visit: Payer: Self-pay | Admitting: Family Medicine

## 2020-08-14 NOTE — Telephone Encounter (Signed)
Recent TSH 07/23/20.  LOV 07/23/20 Approved per protocol at this time.  Requested Prescriptions  Pending Prescriptions Disp Refills  . levothyroxine (SYNTHROID) 100 MCG tablet [Pharmacy Med Name: LEVOTHYROXINE 0.100MG  ( ) TAB] 90 tablet 1    Sig: TAKE 1 TABLET(100 MCG) BY MOUTH DAILY BEFORE BREAKFAST     Endocrinology:  Hypothyroid Agents Failed - 08/14/2020  6:35 AM      Failed - TSH needs to be rechecked within 3 months after an abnormal result. Refill until TSH is due.      Failed - TSH in normal range and within 360 days    TSH  Date Value Ref Range Status  07/23/2020 4.640 (H) 0.450 - 4.500 uIU/mL Final         Passed - Valid encounter within last 12 months    Recent Outpatient Visits          3 weeks ago Other specified hypothyroidism   [facility] Grayce Sessions, NP   6 months ago Post-nasal drip   [facility] [place], [place], [place]   8 months ago Chronic right-sided low back pain without sciatica   [facility] [place], [place], [place]

## 2020-08-25 ENCOUNTER — Telehealth: Payer: Self-pay | Admitting: Primary Care

## 2020-08-25 NOTE — Telephone Encounter (Signed)
Pt was sent a letter from financial dept. Inform them, that the application they submitted was incomplete, since they were missing some documentation at the time of the appointment, Pt need to reschedule and resubmit all new papers and application for CAFA and [place], P.S. old documents has been sent back by mail to the Pt and Pt. need to make a new appt. 

## 2020-09-23 ENCOUNTER — Other Ambulatory Visit: Payer: Self-pay

## 2020-09-30 ENCOUNTER — Other Ambulatory Visit: Payer: Self-pay

## 2020-10-08 ENCOUNTER — Other Ambulatory Visit: Payer: Self-pay

## 2020-10-29 ENCOUNTER — Ambulatory Visit: Payer: Self-pay

## 2020-11-12 ENCOUNTER — Other Ambulatory Visit: Payer: Self-pay | Admitting: Family Medicine

## 2020-11-12 NOTE — Telephone Encounter (Signed)
Requested medications are due for refill today.  yes  Requested medications are on the active medications list.  yes  Last refill. 08/14/2020  Future visit scheduled.   no  Notes to clinic.  Pt needs lab work.

## 2020-11-12 NOTE — Telephone Encounter (Signed)
Can she get refills or does she need lab appt for TSH? Last TSH was in April.

## 2020-11-12 NOTE — Telephone Encounter (Signed)
Needs TSH/T4

## 2021-01-22 ENCOUNTER — Emergency Department (HOSPITAL_COMMUNITY): Payer: Self-pay

## 2021-01-22 ENCOUNTER — Emergency Department (HOSPITAL_COMMUNITY)
Admission: EM | Admit: 2021-01-22 | Discharge: 2021-01-22 | Disposition: A | Discharge status: Home / Self Care | Payer: Self-pay | Attending: Emergency Medicine | Admitting: Emergency Medicine

## 2021-01-22 ENCOUNTER — Encounter (HOSPITAL_COMMUNITY): Payer: Self-pay

## 2021-01-22 ENCOUNTER — Other Ambulatory Visit: Payer: Self-pay

## 2021-01-22 DIAGNOSIS — E119 Type 2 diabetes mellitus without complications: Secondary | ICD-10-CM | POA: Insufficient documentation

## 2021-01-22 DIAGNOSIS — U071 COVID-19: Secondary | ICD-10-CM | POA: Insufficient documentation

## 2021-01-22 DIAGNOSIS — M549 Dorsalgia, unspecified: Secondary | ICD-10-CM | POA: Insufficient documentation

## 2021-01-22 DIAGNOSIS — I10 Essential (primary) hypertension: Secondary | ICD-10-CM | POA: Insufficient documentation

## 2021-01-22 DIAGNOSIS — F17210 Nicotine dependence, cigarettes, uncomplicated: Secondary | ICD-10-CM | POA: Insufficient documentation

## 2021-01-22 DIAGNOSIS — R0981 Nasal congestion: Secondary | ICD-10-CM | POA: Insufficient documentation

## 2021-01-22 DIAGNOSIS — Z79899 Other long term (current) drug therapy: Secondary | ICD-10-CM | POA: Insufficient documentation

## 2021-01-22 DIAGNOSIS — E039 Hypothyroidism, unspecified: Secondary | ICD-10-CM | POA: Insufficient documentation

## 2021-01-22 LAB — CBC WITH DIFFERENTIAL/PLATELET
Abs Immature Granulocytes: 0 10*3/uL (ref 0.00–0.07)
Basophils Absolute: 0 10*3/uL (ref 0.0–0.1)
Basophils Relative: 1 %
Eosinophils Absolute: 0 10*3/uL (ref 0.0–0.5)
Eosinophils Relative: 0 %
HCT: 46.4 % — ABNORMAL HIGH (ref 36.0–46.0)
Hemoglobin: 15.8 g/dL — ABNORMAL HIGH (ref 12.0–15.0)
Immature Granulocytes: 0 %
Lymphocytes Relative: 48 %
Lymphs Abs: 1.8 10*3/uL (ref 0.7–4.0)
MCH: 31.7 pg (ref 26.0–34.0)
MCHC: 34.1 g/dL (ref 30.0–36.0)
MCV: 93 fL (ref 80.0–100.0)
Monocytes Absolute: 0.4 10*3/uL (ref 0.1–1.0)
Monocytes Relative: 10 %
Neutro Abs: 1.5 10*3/uL — ABNORMAL LOW (ref 1.7–7.7)
Neutrophils Relative %: 41 %
Platelets: 376 10*3/uL (ref 150–400)
RBC: 4.99 MIL/uL (ref 3.87–5.11)
RDW: 13.9 % (ref 11.5–15.5)
WBC: 3.7 10*3/uL — ABNORMAL LOW (ref 4.0–10.5)
nRBC: 0 % (ref 0.0–0.2)

## 2021-01-22 LAB — RESP PANEL BY RT-PCR (FLU A&B, COVID) ARPGX2
Influenza A by PCR: NEGATIVE
Influenza B by PCR: NEGATIVE
SARS Coronavirus 2 by RT PCR: POSITIVE — AB

## 2021-01-22 LAB — COMPREHENSIVE METABOLIC PANEL
ALT: 16 U/L (ref 0–44)
AST: 21 U/L (ref 15–41)
Albumin: 4.3 g/dL (ref 3.5–5.0)
Alkaline Phosphatase: 47 U/L (ref 38–126)
Anion gap: 8 (ref 5–15)
BUN: 8 mg/dL (ref 8–23)
CO2: 25 mmol/L (ref 22–32)
Calcium: 9.3 mg/dL (ref 8.9–10.3)
Chloride: 113 mmol/L — ABNORMAL HIGH (ref 98–111)
Creatinine, Ser: 0.74 mg/dL (ref 0.44–1.00)
GFR, Estimated: >60 mL/min (ref >60)
Glucose, Bld: 109 mg/dL — ABNORMAL HIGH (ref 70–99)
Potassium: 3.8 mmol/L (ref 3.5–5.1)
Sodium: 146 mmol/L — ABNORMAL HIGH (ref 135–145)
Total Bilirubin: 0.6 mg/dL (ref 0.3–1.2)
Total Protein: 7 g/dL (ref 6.5–8.1)

## 2021-01-22 LAB — BRAIN NATRIURETIC PEPTIDE: B Natriuretic Peptide: 23.9 pg/mL (ref 0.0–100.0)

## 2021-01-22 LAB — TROPONIN I (HIGH SENSITIVITY): Troponin I (High Sensitivity): 12 ng/L (ref <18)

## 2021-01-22 IMAGING — CR DG CHEST 2V
2 series · 2 of 2 positions shown · non-contrast
Comparison: Chest radiographs [DATE] and earlier.

CLINICAL DATA: 60-year-old female with chest pain. Epigastric pain,
nausea vomiting and shortness of breath for 3 days. Smoker.

EXAM:
CHEST - 2 VIEW

[w chest pa]
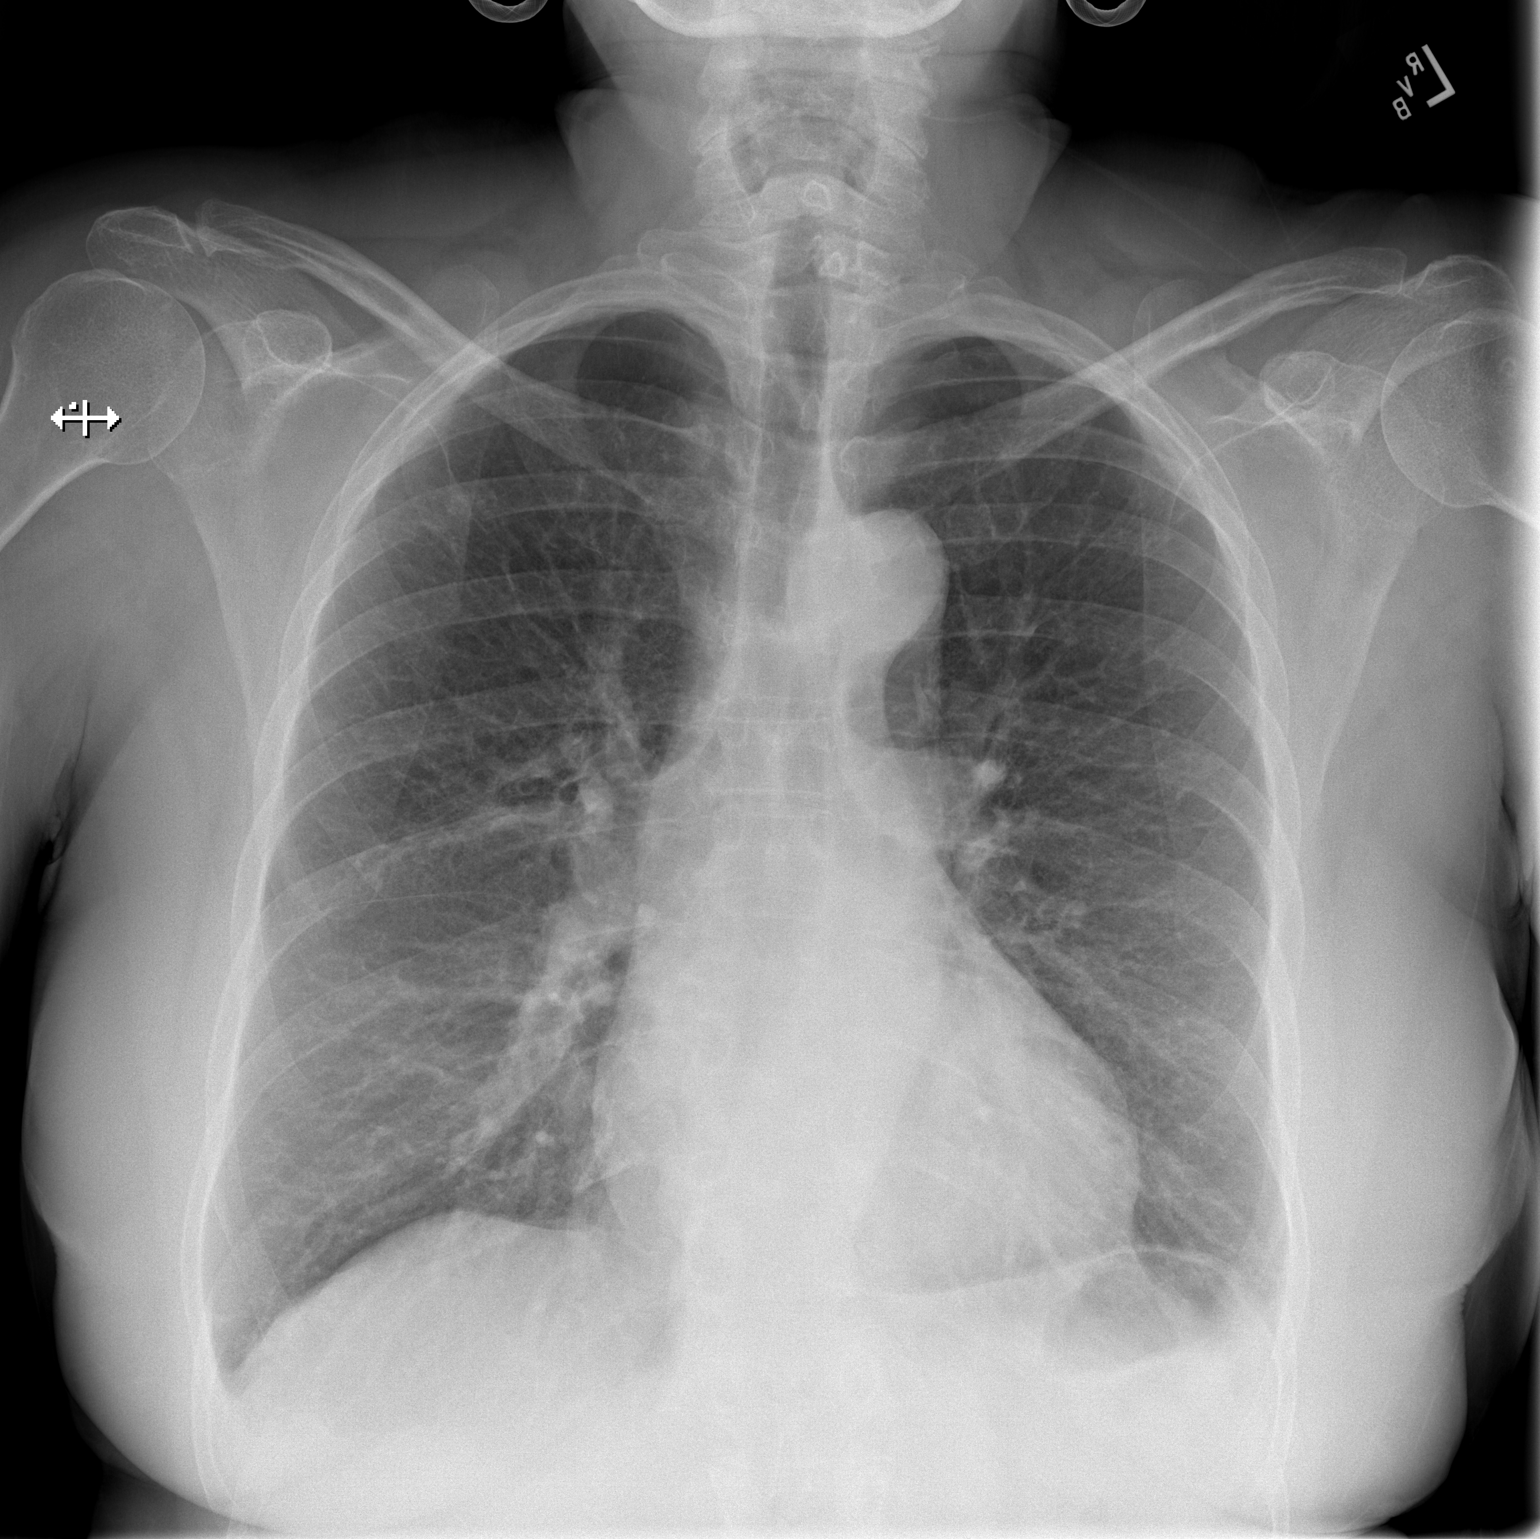

[w chest lat]
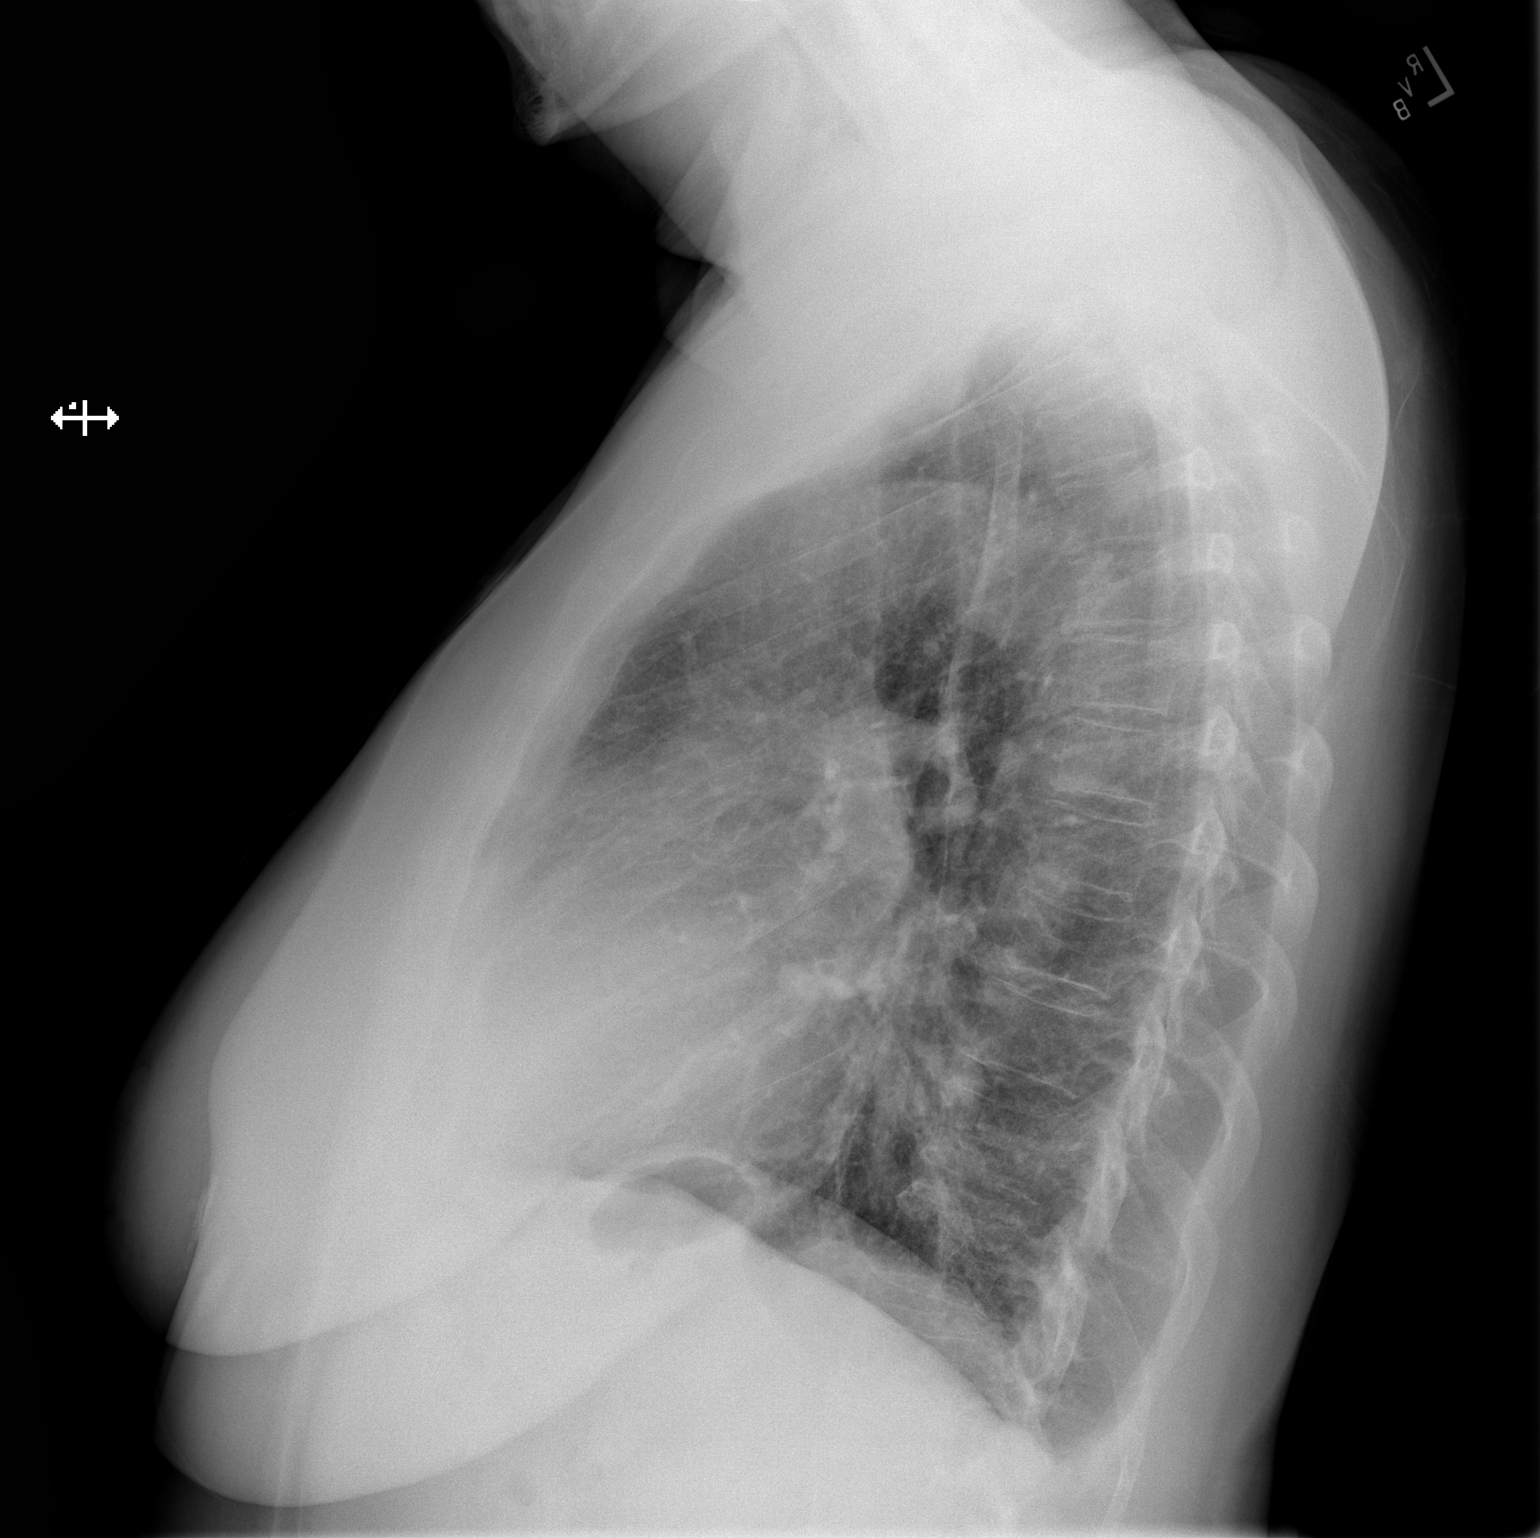

[2 of 2 positions shown; findings below may reference images not displayed]

FINDINGS: Chronic somewhat large lung volumes and chronic increased
interstitial markings in both lungs. Stable cardiac size at the
upper limits of normal. Stable tortuosity of the thoracic aorta.
Other mediastinal contours are within normal limits. Visualized
tracheal air column is within normal limits. No pneumothorax or
acute pulmonary opacity.

No acute osseous abnormality identified. Negative visible bowel gas.
IMPRESSION: No acute cardiopulmonary abnormality.

## 2021-01-22 MED ORDER — NIRMATRELVIR/RITONAVIR (PAXLOVID)TABLET: 3.0000 | ORAL_TABLET | Freq: Two times a day (BID) | ORAL | 0 refills | Status: AC

## 2021-01-22 NOTE — Discharge Instructions (Addendum)
As discussed, you have been diagnosed with COVID.  Please return here for concerning changes, otherwise obtain and take your prescribed medication.

## 2021-01-22 NOTE — ED Triage Notes (Signed)
Pt arrived via POV, c/o chest pain, SOB, body aches since Wednesday.

## 2021-01-22 NOTE — ED Notes (Signed)
Pt advised she needed updated vital signs. Pt states that she wants the results of her blood work and chest x-ray. Pt states that she does nol want to take another set of vital signs.

## 2021-01-22 NOTE — ED Provider Notes (Signed)
[facility]-EMERGENCY DEPT Provider Note   CSN: [id number] Arrival date & time: 01/22/21  0920     History Chief Complaint  Patient presents with   Chest Pain    Jessica Cooke is a 60 y.o. female.  HPI Patient presents with chest pain, back pain, dyspnea, congestion, subjective fever.  Onset was 3 days ago.  Her husband has a similar illness, though perhaps not as severe.  They have notable sick contact with grandchildren who the suspect clinically of having COVID, and they had contact with in the past few days. Patient is generally well, though she does have a history of hypertension, diabetes and continues to smoke cigarettes.  Since onset no relief with OTC medication.  Pain is sore, moderate, persistent, making sleep difficult.    Past Medical History:  Diagnosis Date   Eczema    Hypothyroid    Thyroid disease     Patient Active Problem List   Diagnosis Date Noted   Hypothyroidism    Insomnia    Spinal stenosis     Past Surgical History:  Procedure Laterality Date   ABDOMINAL HYSTERECTOMY       OB History   No obstetric history on file.     Family History  Problem Relation Age of Onset   Hypertension Mother    Hypertension Sister    Hypertension Brother     Social History   Tobacco Use   Smoking status: Every Day    Packs/day: 1.00    Types: Cigarettes   Smokeless tobacco: Never  Substance Use Topics   Alcohol use: Yes    Comment: rarely   Drug use: No    Home Medications Prior to Admission medications   Medication Sig Start Date End Date Taking? Authorizing Provider  levothyroxine (SYNTHROID) 100 MCG tablet TAKE 1 TABLET(100 MCG) BY MOUTH DAILY BEFORE BREAKFAST 08/14/20   Hoy Register, MD  meloxicam (MOBIC) 15 MG tablet TAKE 1 TABLET (15 MG TOTAL) BY MOUTH DAILY. Patient not taking: Reported on 07/23/2020 01/27/20 01/26/21  Hoy Register, MD  methocarbamol (ROBAXIN) 500 MG tablet TAKE 2 TABLETS (1,000 MG TOTAL) BY  MOUTH EVERY 8 (EIGHT) HOURS AS NEEDED FOR MUSCLE SPASMS. 07/23/20 07/23/21  Grayce Sessions, NP  nicotine (NICODERM CQ) 21 mg/24hr patch Place 1 patch (21 mg total) onto the skin daily. 07/23/20   Grayce Sessions, NP    Allergies    Dilaudid [hydromorphone hcl] and Tramadol  Review of Systems   Review of Systems  Constitutional:        Per HPI, otherwise negative  HENT:         Per HPI, otherwise negative  Respiratory:         Per HPI, otherwise negative  Cardiovascular:        Per HPI, otherwise negative  Gastrointestinal:  Negative for vomiting.  Endocrine:       Negative aside from HPI  Genitourinary:        Neg aside from HPI   Musculoskeletal:        Per HPI, otherwise negative  Skin: Negative.   Neurological:  Negative for syncope.   Physical Exam Updated Vital Signs BP 124/80   Pulse 96   Temp 100.2 F (37.9 C) (Oral)   Resp 16   SpO2 92%   Physical Exam Vitals and nursing note reviewed.  Constitutional:      General: She is not in acute distress.    Appearance: She is well-developed.  HENT:  Head: Normocephalic and atraumatic.  Eyes:     Conjunctiva/sclera: Conjunctivae normal.  Cardiovascular:     Rate and Rhythm: Normal rate and regular rhythm.  Pulmonary:     Effort: Pulmonary effort is normal. No respiratory distress.     Breath sounds: Normal breath sounds. No stridor.  Abdominal:     General: There is no distension.  Skin:    General: Skin is warm and dry.  Neurological:     Mental Status: She is alert and oriented to person, place, and time.     Cranial Nerves: No cranial nerve deficit.    ED Results / Procedures / Treatments   Labs (all labs ordered are listed, but only abnormal results are displayed) Labs Reviewed  RESP PANEL BY RT-PCR (FLU A&B, COVID) ARPGX2 - Abnormal; Notable for the following components:      Result Value   SARS Coronavirus 2 by RT PCR POSITIVE (*)    All other components within normal limits  COMPREHENSIVE  METABOLIC PANEL - Abnormal; Notable for the following components:   Sodium 146 (*)    Chloride 113 (*)    Glucose, Bld 109 (*)    All other components within normal limits  CBC WITH DIFFERENTIAL/PLATELET - Abnormal; Notable for the following components:   WBC 3.7 (*)    Hemoglobin 15.8 (*)    HCT 46.4 (*)    Neutro Abs 1.5 (*)    All other components within normal limits  BRAIN NATRIURETIC PEPTIDE  TROPONIN I (HIGH SENSITIVITY)  TROPONIN I (HIGH SENSITIVITY)    EKG EKG Interpretation  Date/Time:  Friday January 22 2021 09:35:08 EDT Ventricular Rate:  116 PR Interval:  168 QRS Duration: 68 QT Interval:  330 QTC Calculation: 459 R Axis:   95 Text Interpretation: Sinus tachycardia Atrial premature complex LAE, consider biatrial enlargement Right axis deviation Low voltage, extremity leads Abnormal ECG Confirmed by Gerhard Munch [phone]) on 01/22/2021 11:23:10 AM  Radiology DG Chest 2 View  Result Date: 01/22/2021 CLINICAL DATA:  60 year old female with chest pain. Epigastric pain, nausea vomiting and shortness of breath for 3 days. Smoker. EXAM: CHEST - 2 VIEW COMPARISON:  Chest radiographs 05/23/2018 and earlier. FINDINGS: Chronic somewhat large lung volumes and chronic increased interstitial markings in both lungs. Stable cardiac size at the upper limits of normal. Stable tortuosity of the thoracic aorta. Other mediastinal contours are within normal limits. Visualized tracheal air column is within normal limits. No pneumothorax or acute pulmonary opacity. No acute osseous abnormality identified. Negative visible bowel gas. IMPRESSION: No acute cardiopulmonary abnormality. Electronically Signed   By: Odessa Fleming M.D.   On: 01/22/2021 11:08    Procedures Procedures   Medications Ordered in ED Medications - No data to display  ED Course  I have reviewed the triage vital signs and the nursing notes.  Pertinent labs & imaging results that were available during my care of the patient  were reviewed by me and considered in my medical decision making (see chart for details).  Update: COVID-positive MDM Rules/Calculators/A&P Adult female presents with 3 days of illness.  She does have chest pain, but findings are reassuring, low suspicion for ACS.  She is not hypoxic, low suspicion for PE.  No evidence of bacteremia, sepsis. X-ray, labs, EKG reviewed, findings consistent with COVID which is positive on PCR testing here.  Patient started on appropriate outpatient therapy, discharged in stable condition. Final Clinical Impression(s) / ED Diagnoses Final diagnoses:  COVID    Rx / DC  Orders ED Discharge Orders          Ordered    nirmatrelvir/ritonavir EUA (PAXLOVID) 20 x 150 MG & 10 x 100MG  TABS  2 times daily        01/22/21 1637             03/24/21, MD 01/22/21 [phone]

## 2021-01-26 ENCOUNTER — Emergency Department (HOSPITAL_BASED_OUTPATIENT_CLINIC_OR_DEPARTMENT_OTHER): Payer: Self-pay

## 2021-01-26 ENCOUNTER — Ambulatory Visit: Payer: Self-pay

## 2021-01-26 ENCOUNTER — Other Ambulatory Visit: Payer: Self-pay

## 2021-01-26 ENCOUNTER — Encounter (HOSPITAL_BASED_OUTPATIENT_CLINIC_OR_DEPARTMENT_OTHER): Payer: Self-pay | Admitting: Emergency Medicine

## 2021-01-26 ENCOUNTER — Emergency Department (HOSPITAL_BASED_OUTPATIENT_CLINIC_OR_DEPARTMENT_OTHER)
Admission: EM | Admit: 2021-01-26 | Discharge: 2021-01-26 | Disposition: A | Discharge status: Home / Self Care | Payer: Self-pay | Attending: Emergency Medicine | Admitting: Emergency Medicine

## 2021-01-26 DIAGNOSIS — E871 Hypo-osmolality and hyponatremia: Secondary | ICD-10-CM | POA: Insufficient documentation

## 2021-01-26 DIAGNOSIS — F17210 Nicotine dependence, cigarettes, uncomplicated: Secondary | ICD-10-CM | POA: Insufficient documentation

## 2021-01-26 DIAGNOSIS — U071 COVID-19: Secondary | ICD-10-CM | POA: Insufficient documentation

## 2021-01-26 DIAGNOSIS — M79662 Pain in left lower leg: Secondary | ICD-10-CM | POA: Insufficient documentation

## 2021-01-26 DIAGNOSIS — M79605 Pain in left leg: Secondary | ICD-10-CM

## 2021-01-26 DIAGNOSIS — Z79899 Other long term (current) drug therapy: Secondary | ICD-10-CM | POA: Insufficient documentation

## 2021-01-26 DIAGNOSIS — E039 Hypothyroidism, unspecified: Secondary | ICD-10-CM | POA: Insufficient documentation

## 2021-01-26 LAB — BASIC METABOLIC PANEL
Anion gap: 11 (ref 5–15)
BUN: 6 mg/dL — ABNORMAL LOW (ref 8–23)
CO2: 19 mmol/L — ABNORMAL LOW (ref 22–32)
Calcium: 8.4 mg/dL — ABNORMAL LOW (ref 8.9–10.3)
Chloride: 109 mmol/L (ref 98–111)
Creatinine, Ser: 0.7 mg/dL (ref 0.44–1.00)
GFR, Estimated: >60 mL/min (ref >60)
Glucose, Bld: 95 mg/dL (ref 70–99)
Potassium: 3.7 mmol/L (ref 3.5–5.1)
Sodium: 139 mmol/L (ref 135–145)

## 2021-01-26 IMAGING — US US EXTREM LOW VENOUS*L*
1 series · 14 of 24 positions shown · non-contrast
Comparison: None.

CLINICAL DATA: Swelling. Left lower extremity pain. COVID positive

EXAM:
Left LOWER EXTREMITY VENOUS DOPPLER ULTRASOUND
TECHNIQUE: Gray-scale sonography with compression, as well as color and duplex
ultrasound, were performed to evaluate the deep venous system(s)
from the level of the common femoral vein through the popliteal and
proximal calf veins.

[Series 1: us venous img lower uni left (dvt) · portal-venous · 14 of 35 slices shown]
[im 1/35]
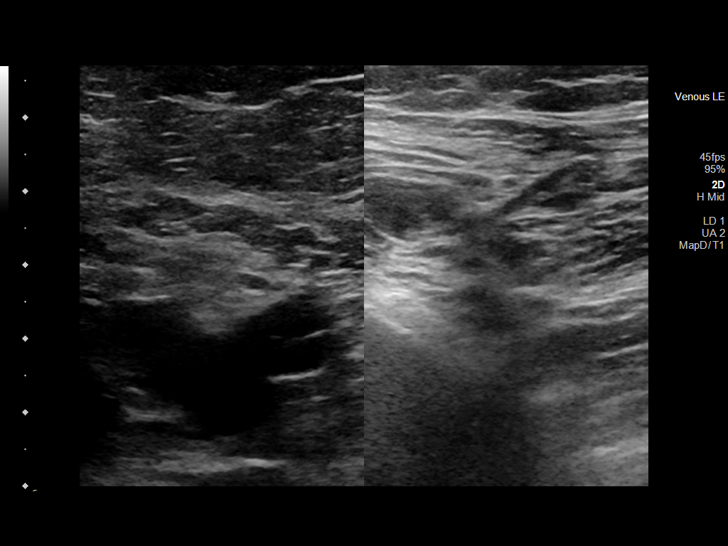
[im 3/35]
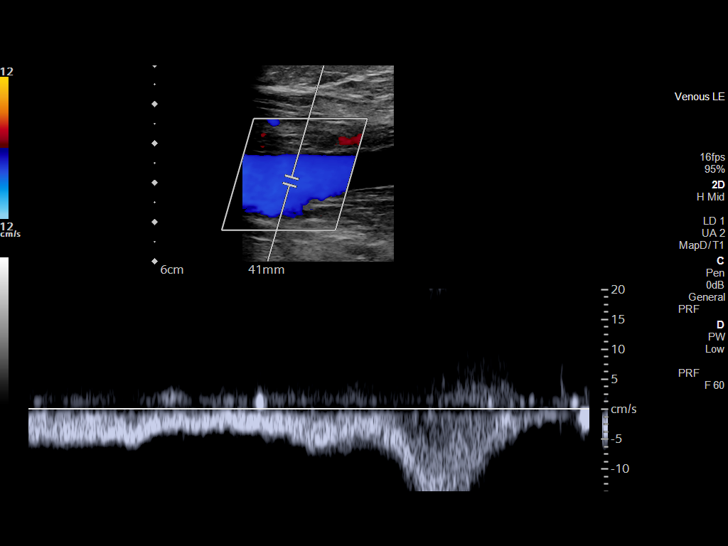
[im 6/35]
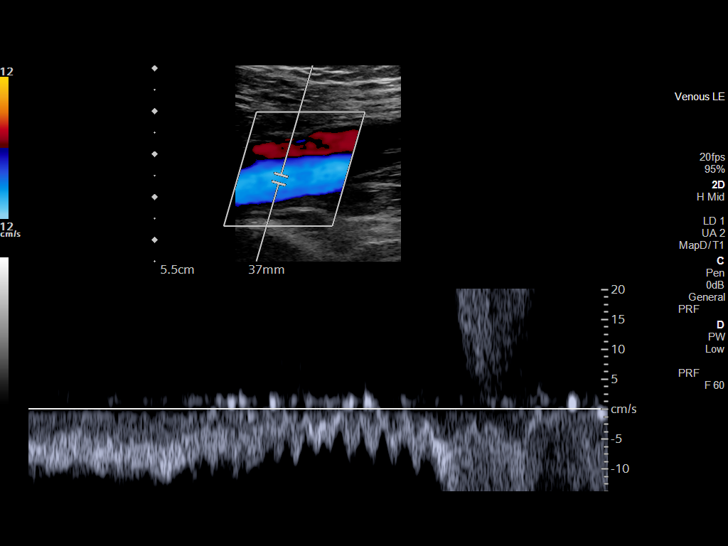
[im 9/35]
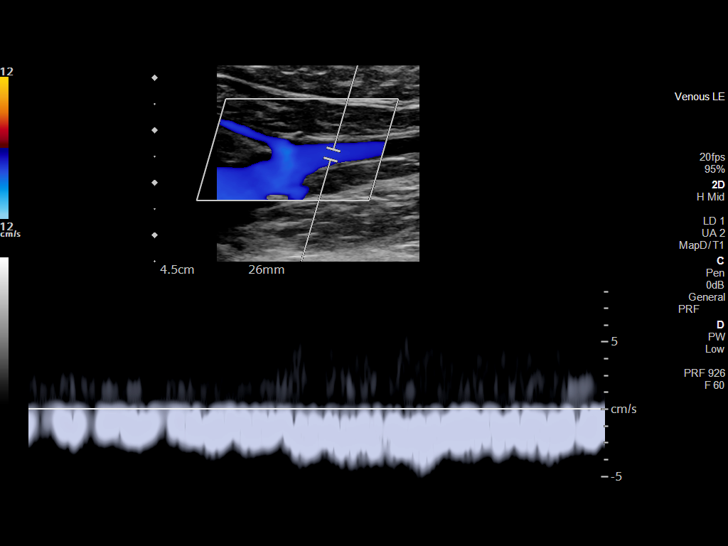
[im 11/35]
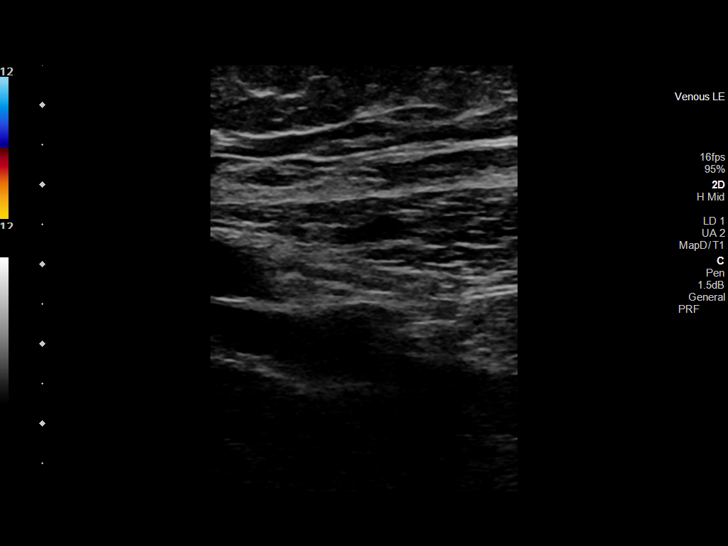
[im 14/35]
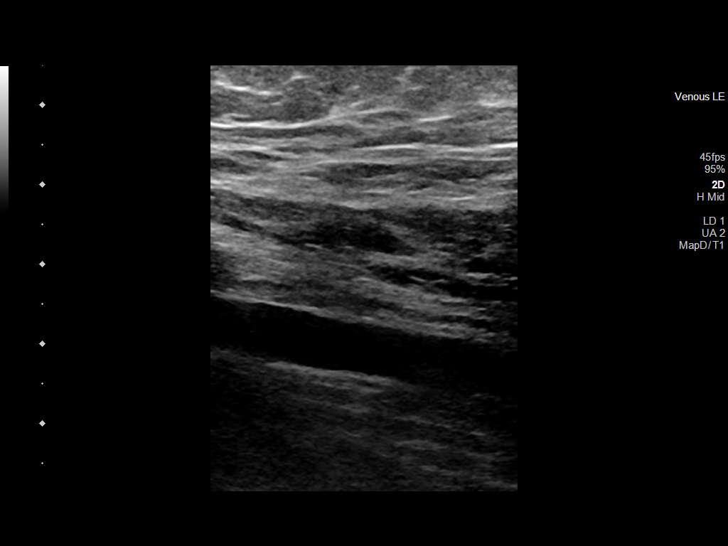
[im 17/35]
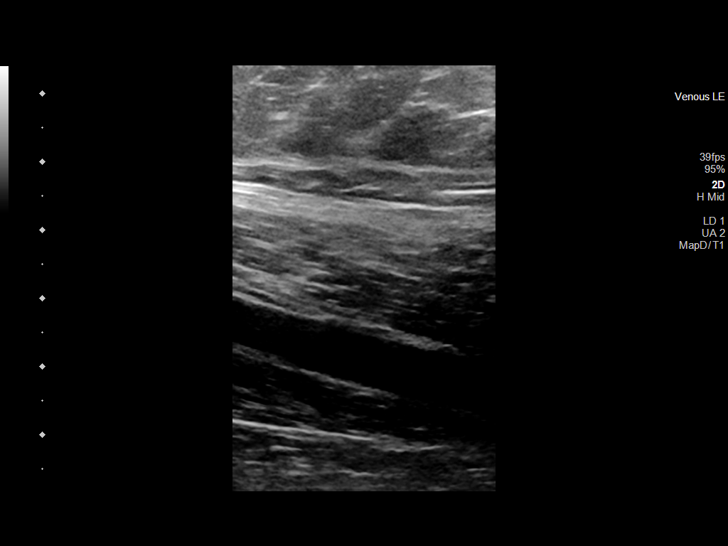
[im 18/35]
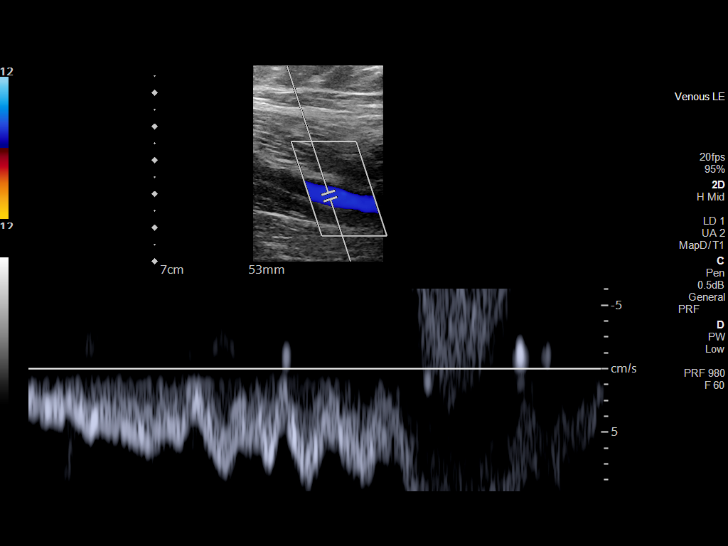
[im 21/35]
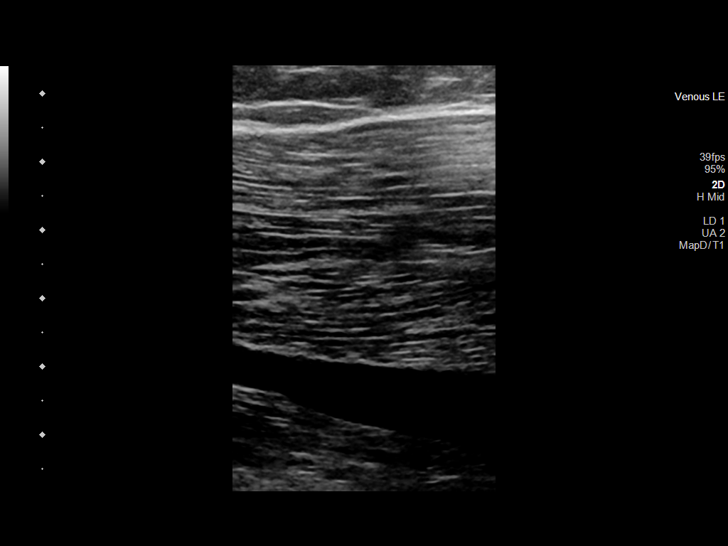
[im 24/35]
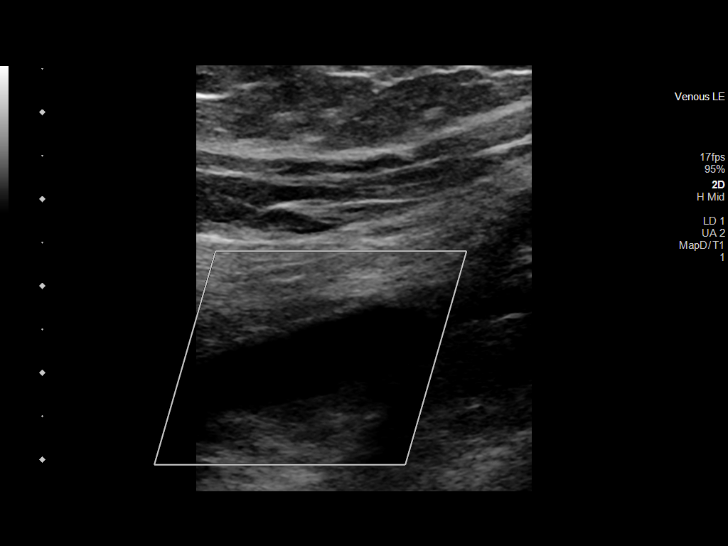
[im 27/35]
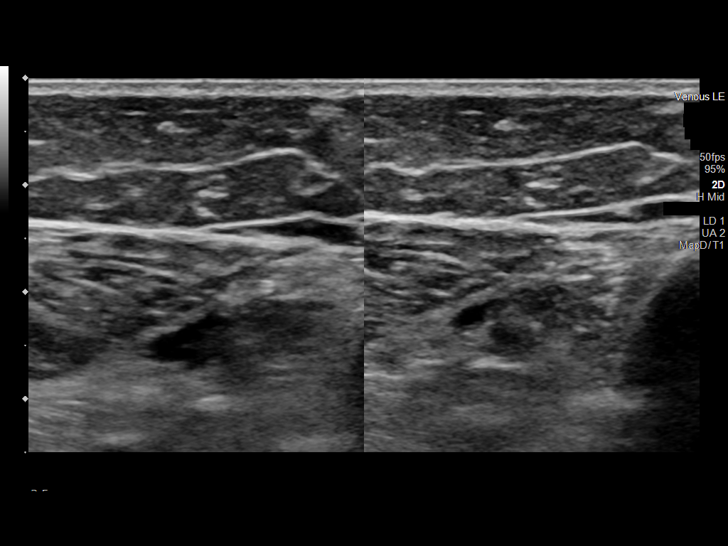
[im 29/35]
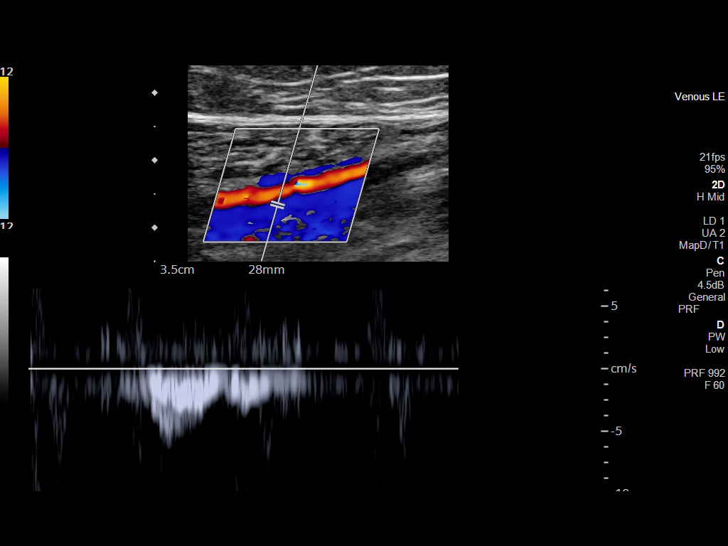
[im 32/35]
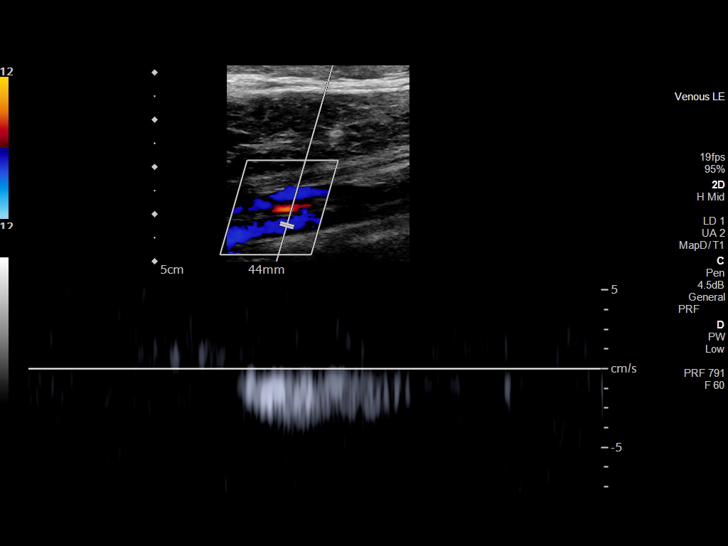
[im 35/35]
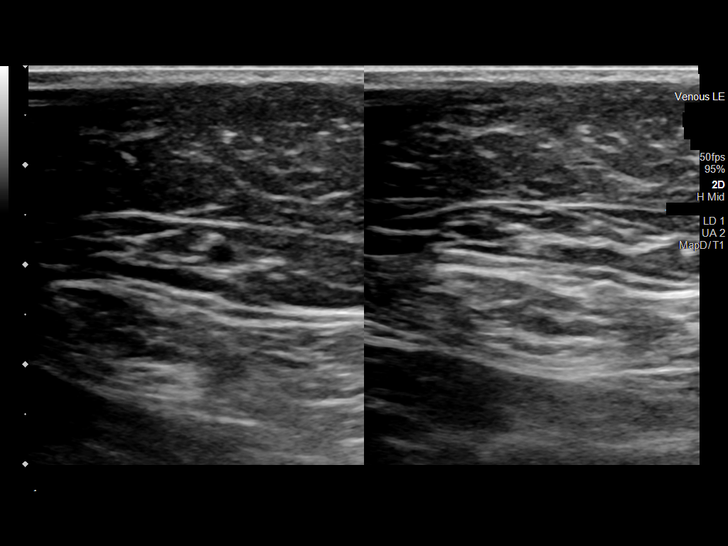

[14 of 24 positions shown; findings below may reference images not displayed]

FINDINGS: VENOUS

Normal compressibility of the common femoral, superficial femoral,
and popliteal veins, as well as the visualized calf veins.
Visualized portions of profunda femoral vein and great saphenous
vein unremarkable. No filling defects to suggest DVT on grayscale or
color Doppler imaging. Doppler waveforms show normal direction of
venous flow, normal respiratory plasticity and response to
augmentation.

Limited views of the contralateral common femoral vein are
unremarkable.

OTHER

None.

Limitations: none
IMPRESSION: No deep venous thrombosis of the left lower extremity.

## 2021-01-26 MED ORDER — OXYCODONE-ACETAMINOPHEN 5-325 MG PO TABS: 1.0000 | ORAL_TABLET | Freq: Three times a day (TID) | ORAL | 0 refills | Status: DC | PRN

## 2021-01-26 MED ORDER — CALCIUM GLUCONATE 500 MG PO TABS: 1.0000 | ORAL_TABLET | Freq: Two times a day (BID) | ORAL | 0 refills | Status: DC

## 2021-01-26 MED ORDER — OXYCODONE-ACETAMINOPHEN 5-325 MG PO TABS
1.0000 | ORAL_TABLET | Freq: Once | ORAL | Status: AC
  Administered 2021-01-26: 1 via ORAL
  Filled 2021-01-26: qty 1

## 2021-01-26 NOTE — ED Triage Notes (Signed)
Pt c/o left leg pain "her entire leg". Pt seen in ED on Friday, tested COVID+ after having chest pains and "feeling sick".  Pt denies any chest pain at present.

## 2021-01-26 NOTE — Discharge Instructions (Addendum)
Your calcium was a little low.  The muscle aches could also be due to COVID or potentially the packs of it you have been on although he did stop that a couple days ago.  Follow-up with your primary care doctor as needed.  Try and keep yourself hydrated also

## 2021-01-26 NOTE — Telephone Encounter (Signed)
Pt. States she started having left leg pain last Friday. States she was seen in ED with COVID 19 symptoms. States the left leg pain "is unbearable, I can hardly walk. I think I have a blood clot." Pt. States she thinks she needs to go back to ED. Instructed pt. To go to ED, verbalizes understanding.    Reason for Disposition  Patient sounds very sick or weak to the triager  Answer Assessment - Initial Assessment Questions 1. ONSET: "When did the pain start?"      Friday 2. LOCATION: "Where is the pain located?"      Left leg 3. PAIN: "How bad is the pain?"    (Scale 1-10; or mild, moderate, severe)   -  MILD (1-3): doesn't interfere with normal activities    -  MODERATE (4-7): interferes with normal activities (e.g., work or school) or awakens from sleep, limping    -  SEVERE (8-10): excruciating pain, unable to do any normal activities, unable to walk     Severe 4. WORK OR EXERCISE: "Has there been any recent work or exercise that involved this part of the body?"      No 5. CAUSE: "What do you think is causing the leg pain?"     Maybe a blood clot 6. OTHER SYMPTOMS: "Do you have any other symptoms?" (e.g., chest pain, back pain, breathing difficulty, swelling, rash, fever, numbness, weakness)     Pt. Has COVID 19 7. PREGNANCY: "Is there any chance you are pregnant?" "When was your last menstrual period?"     No  Protocols used: Leg Pain-A-AH

## 2021-01-26 NOTE — ED Notes (Signed)
ED Provider at bedside. 

## 2021-01-26 NOTE — ED Provider Notes (Signed)
[facility] EMERGENCY DEPT Provider Note   CSN: [id number] Arrival date & time: 01/26/21  1414     History Chief Complaint  Patient presents with   Leg Pain    Jessica Cooke is a 60 y.o. female.   Leg Pain Associated symptoms: fatigue   Patient presents with left-sided leg pain.  Had some aching yesterday but more pain today.  Diagnosed on Friday with COVID.  States the pain is in her left leg.  States she is having trouble sleeping and moving because of it.  No chest pain.  Mild difficulty breathing which she contributes to COVID.  Denies swelling in her legs.  No history of blood clots.  Usually does not get leg pain.    Past Medical History:  Diagnosis Date   Eczema    Hypothyroid    Thyroid disease     Patient Active Problem List   Diagnosis Date Noted   Hypothyroidism    Insomnia    Spinal stenosis     Past Surgical History:  Procedure Laterality Date   ABDOMINAL HYSTERECTOMY       OB History   No obstetric history on file.     Family History  Problem Relation Age of Onset   Hypertension Mother    Hypertension Sister    Hypertension Brother     Social History   Tobacco Use   Smoking status: Every Day    Packs/day: 1.00    Types: Cigarettes   Smokeless tobacco: Never  Substance Use Topics   Alcohol use: Yes    Comment: rarely   Drug use: No    Home Medications Prior to Admission medications   Medication Sig Start Date End Date Taking? Authorizing Provider  calcium gluconate 500 MG tablet Take 1 tablet (500 mg total) by mouth 2 (two) times daily. 01/26/21  Yes Benjiman Core, MD  oxyCODONE-acetaminophen (PERCOCET/ROXICET) 5-325 MG tablet Take 1-2 tablets by mouth every 8 (eight) hours as needed for severe pain. 01/26/21  Yes Benjiman Core, MD  levothyroxine (SYNTHROID) 100 MCG tablet TAKE 1 TABLET(100 MCG) BY MOUTH DAILY BEFORE BREAKFAST 08/14/20   Hoy Register, MD  meloxicam (MOBIC) 15 MG tablet TAKE 1 TABLET (15 MG  TOTAL) BY MOUTH DAILY. Patient not taking: Reported on 07/23/2020 01/27/20 01/26/21  Hoy Register, MD  methocarbamol (ROBAXIN) 500 MG tablet TAKE 2 TABLETS (1,000 MG TOTAL) BY MOUTH EVERY 8 (EIGHT) HOURS AS NEEDED FOR MUSCLE SPASMS. 07/23/20 07/23/21  Grayce Sessions, NP  nicotine (NICODERM CQ) 21 mg/24hr patch Place 1 patch (21 mg total) onto the skin daily. 07/23/20   Grayce Sessions, NP  nirmatrelvir/ritonavir EUA (PAXLOVID) 20 x 150 MG & 10 x 100MG  TABS Take 3 tablets by mouth 2 (two) times daily for 5 days. Patient GFR is >60. Take nirmatrelvir (150 mg) two tablets twice daily for 5 days and ritonavir (100 mg) one tablet twice daily for 5 days. 01/22/21 01/27/21  03/29/21, MD    Allergies    Dilaudid [hydromorphone hcl] and Tramadol  Review of Systems   Review of Systems  Constitutional:  Positive for fatigue. Negative for appetite change.  Respiratory:  Negative for shortness of breath.   Cardiovascular:  Negative for chest pain.  Gastrointestinal:  Negative for anal bleeding.  Genitourinary:  Negative for flank pain.  Musculoskeletal:  Negative for myalgias.       Left leg pain.  Skin:  Negative for rash.  Neurological:  Negative for weakness and numbness.  Psychiatric/Behavioral:  Negative for confusion.    Physical Exam Updated Vital Signs BP (!) 145/83   Pulse 84   Temp 98.1 F (36.7 C) (Oral)   Resp 18   Ht 5\' 9"  (1.753 m)   Wt 98.9 kg   SpO2 98%   BMI 32.20 kg/m   Physical Exam Vitals and nursing note reviewed.  HENT:     Head: Atraumatic.  Eyes:     Pupils: Pupils are equal, round, and reactive to light.  Cardiovascular:     Rate and Rhythm: Normal rate and regular rhythm.  Abdominal:     Tenderness: There is no abdominal tenderness.  Musculoskeletal:     Cervical back: Neck supple.     Left lower leg: Edema present.  Skin:    General: Skin is warm.     Capillary Refill: Capillary refill takes less than 2 seconds.  Neurological:     Mental  Status: She is alert and oriented to person, place, and time.  Psychiatric:        Mood and Affect: Mood normal.    ED Results / Procedures / Treatments   Labs (all labs ordered are listed, but only abnormal results are displayed) Labs Reviewed  BASIC METABOLIC PANEL - Abnormal; Notable for the following components:      Result Value   CO2 19 (*)    BUN 6 (*)    Calcium 8.4 (*)    All other components within normal limits    EKG None  Radiology Venous Img Lower Unilateral Left  Result Date: 01/26/2021 CLINICAL DATA:  Swelling. Left lower extremity pain. COVID positive EXAM: Left LOWER EXTREMITY VENOUS DOPPLER ULTRASOUND TECHNIQUE: Gray-scale sonography with compression, as well as color and duplex ultrasound, were performed to evaluate the deep venous system(s) from the level of the common femoral vein through the popliteal and proximal calf veins. COMPARISON:  None. FINDINGS: VENOUS Normal compressibility of the common femoral, superficial femoral, and popliteal veins, as well as the visualized calf veins. Visualized portions of profunda femoral vein and great saphenous vein unremarkable. No filling defects to suggest DVT on grayscale or color Doppler imaging. Doppler waveforms show normal direction of venous flow, normal respiratory plasticity and response to augmentation. Limited views of the contralateral common femoral vein are unremarkable. OTHER None. Limitations: none IMPRESSION: No deep venous thrombosis of the left lower extremity. Electronically Signed   By: 03/28/2021 M.D.   On: 01/26/2021 20:19    Procedures Procedures   Medications Ordered in ED Medications  oxyCODONE-acetaminophen (PERCOCET/ROXICET) 5-325 MG per tablet 1 tablet (1 tablet Oral Given 01/26/21 2155)    ED Course  I have reviewed the triage vital signs and the nursing notes.  Pertinent labs & imaging results that were available during my care of the patient were reviewed by me and considered in  my medical decision making (see chart for details).    MDM Rules/Calculators/A&P                           Patient with left lower extremity pain.  Recent COVID diagnosis.  Doppler done negative.  Also has a mild hypocalcemia.  Will supplement.  Lab work done since had a hyponatremia.  Paxlovid has caused some muscle pain too, the patient states she has been off it for a few days now.  We will treat with some pain medicine.  Outpatient follow-up with PCP as needed Final Clinical Impression(s) / ED Diagnoses Final  diagnoses:  Pain of left lower extremity  Hypocalcemia    Rx / DC Orders ED Discharge Orders          Ordered    calcium gluconate 500 MG tablet  2 times daily        01/26/21 2215    oxyCODONE-acetaminophen (PERCOCET/ROXICET) 5-325 MG tablet  Every 8 hours PRN        01/26/21 2223             Benjiman Core, MD 01/26/21 2353

## 2021-01-29 ENCOUNTER — Inpatient Hospital Stay (HOSPITAL_COMMUNITY)
Admission: EM | Admit: 2021-01-29 | Discharge: 2021-02-03 | DRG: 177 | Disposition: A | Discharge status: Home / Self Care | Payer: Self-pay | Attending: Internal Medicine | Admitting: Internal Medicine

## 2021-01-29 ENCOUNTER — Emergency Department (HOSPITAL_COMMUNITY): Payer: Self-pay

## 2021-01-29 ENCOUNTER — Ambulatory Visit: Payer: Self-pay | Admitting: *Deleted

## 2021-01-29 ENCOUNTER — Encounter (HOSPITAL_COMMUNITY): Payer: Self-pay | Admitting: Emergency Medicine

## 2021-01-29 DIAGNOSIS — E782 Mixed hyperlipidemia: Secondary | ICD-10-CM | POA: Diagnosis present

## 2021-01-29 DIAGNOSIS — Z885 Allergy status to narcotic agent status: Secondary | ICD-10-CM

## 2021-01-29 DIAGNOSIS — U071 COVID-19: Principal | ICD-10-CM

## 2021-01-29 DIAGNOSIS — E876 Hypokalemia: Secondary | ICD-10-CM | POA: Diagnosis present

## 2021-01-29 DIAGNOSIS — M79662 Pain in left lower leg: Secondary | ICD-10-CM | POA: Diagnosis present

## 2021-01-29 DIAGNOSIS — F418 Other specified anxiety disorders: Secondary | ICD-10-CM | POA: Insufficient documentation

## 2021-01-29 DIAGNOSIS — R7303 Prediabetes: Secondary | ICD-10-CM | POA: Insufficient documentation

## 2021-01-29 DIAGNOSIS — E669 Obesity, unspecified: Secondary | ICD-10-CM | POA: Diagnosis present

## 2021-01-29 DIAGNOSIS — Z6832 Body mass index (BMI) 32.0-32.9, adult: Secondary | ICD-10-CM

## 2021-01-29 DIAGNOSIS — Z90710 Acquired absence of both cervix and uterus: Secondary | ICD-10-CM

## 2021-01-29 DIAGNOSIS — Z8249 Family history of ischemic heart disease and other diseases of the circulatory system: Secondary | ICD-10-CM

## 2021-01-29 DIAGNOSIS — E871 Hypo-osmolality and hyponatremia: Secondary | ICD-10-CM | POA: Diagnosis present

## 2021-01-29 DIAGNOSIS — Z79890 Hormone replacement therapy (postmenopausal): Secondary | ICD-10-CM

## 2021-01-29 DIAGNOSIS — M79605 Pain in left leg: Secondary | ICD-10-CM

## 2021-01-29 DIAGNOSIS — J189 Pneumonia, unspecified organism: Secondary | ICD-10-CM | POA: Diagnosis present

## 2021-01-29 DIAGNOSIS — M5430 Sciatica, unspecified side: Secondary | ICD-10-CM | POA: Insufficient documentation

## 2021-01-29 DIAGNOSIS — Z79899 Other long term (current) drug therapy: Secondary | ICD-10-CM

## 2021-01-29 DIAGNOSIS — M4800 Spinal stenosis, site unspecified: Secondary | ICD-10-CM

## 2021-01-29 DIAGNOSIS — J9601 Acute respiratory failure with hypoxia: Secondary | ICD-10-CM

## 2021-01-29 DIAGNOSIS — I10 Essential (primary) hypertension: Secondary | ICD-10-CM | POA: Diagnosis present

## 2021-01-29 DIAGNOSIS — Z720 Tobacco use: Secondary | ICD-10-CM | POA: Diagnosis present

## 2021-01-29 DIAGNOSIS — R0902 Hypoxemia: Secondary | ICD-10-CM

## 2021-01-29 DIAGNOSIS — E039 Hypothyroidism, unspecified: Secondary | ICD-10-CM | POA: Diagnosis present

## 2021-01-29 DIAGNOSIS — F419 Anxiety disorder, unspecified: Secondary | ICD-10-CM | POA: Insufficient documentation

## 2021-01-29 DIAGNOSIS — J1282 Pneumonia due to coronavirus disease 2019: Secondary | ICD-10-CM | POA: Diagnosis present

## 2021-01-29 DIAGNOSIS — F17210 Nicotine dependence, cigarettes, uncomplicated: Secondary | ICD-10-CM | POA: Diagnosis present

## 2021-01-29 LAB — BASIC METABOLIC PANEL
Anion gap: 13 (ref 5–15)
BUN: 9 mg/dL (ref 8–23)
CO2: 19 mmol/L — ABNORMAL LOW (ref 22–32)
Calcium: 9 mg/dL (ref 8.9–10.3)
Chloride: 103 mmol/L (ref 98–111)
Creatinine, Ser: 0.82 mg/dL (ref 0.44–1.00)
GFR, Estimated: >60 mL/min (ref >60)
Glucose, Bld: 91 mg/dL (ref 70–99)
Potassium: 5.7 mmol/L — ABNORMAL HIGH (ref 3.5–5.1)
Sodium: 135 mmol/L (ref 135–145)

## 2021-01-29 LAB — CBC
HCT: 49.4 % — ABNORMAL HIGH (ref 36.0–46.0)
Hemoglobin: 17 g/dL — ABNORMAL HIGH (ref 12.0–15.0)
MCH: 31.7 pg (ref 26.0–34.0)
MCHC: 34.4 g/dL (ref 30.0–36.0)
MCV: 92 fL (ref 80.0–100.0)
Platelets: 507 10*3/uL — ABNORMAL HIGH (ref 150–400)
RBC: 5.37 MIL/uL — ABNORMAL HIGH (ref 3.87–5.11)
RDW: 13.8 % (ref 11.5–15.5)
WBC: 8 10*3/uL (ref 4.0–10.5)
nRBC: 0 % (ref 0.0–0.2)

## 2021-01-29 LAB — CK: Total CK: 136 U/L (ref 38–234)

## 2021-01-29 IMAGING — CT CT ANGIO CHEST
2 of 7 series · 18 of 46 positions shown · IV contrast (APPLIED)
Comparison: Chest x-ray from the previous day.

CLINICAL DATA: Hypoxia

EXAM:
CT ANGIOGRAPHY CHEST WITH CONTRAST
TECHNIQUE: Multidetector CT imaging of the chest was performed using the
standard protocol during bolus administration of intravenous
contrast. Multiplanar CT image reconstructions and MIPs were
obtained to evaluate the vascular anatomy.
CONTRAST:  75mL OMNIPAQUE IOHEXOL 350 MG/ML SOLN

[Series 7: thins · axial · 0.75mm/px · z∈[+1059,+1369]mm · 15 of 498 slices shown]
[im 28/498  lung]
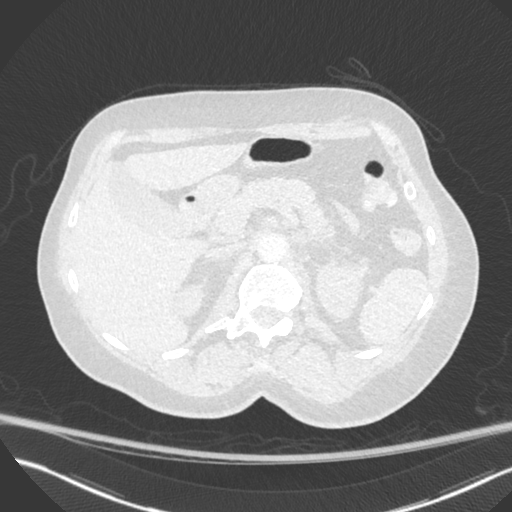
[im 56/498  soft-tissue]
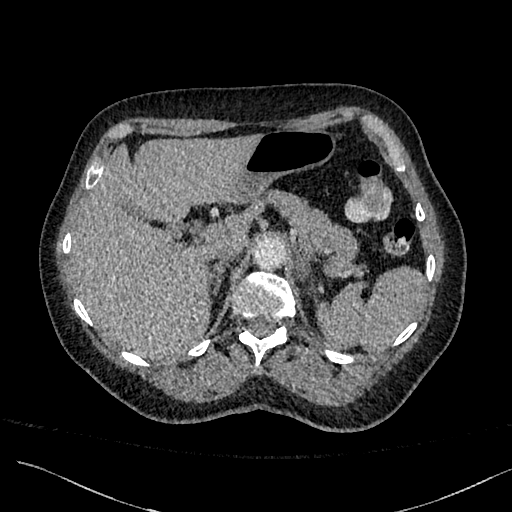
[im 83/498  lung]
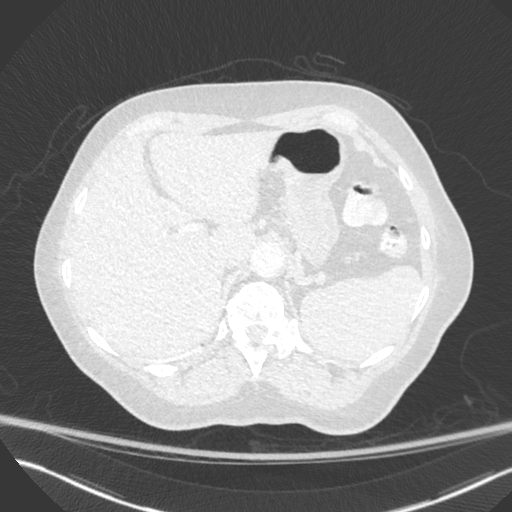
[im 111/498  soft-tissue]
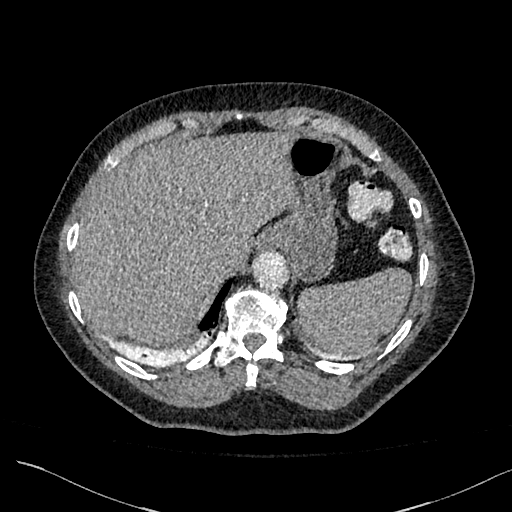
[im 166/498  lung]
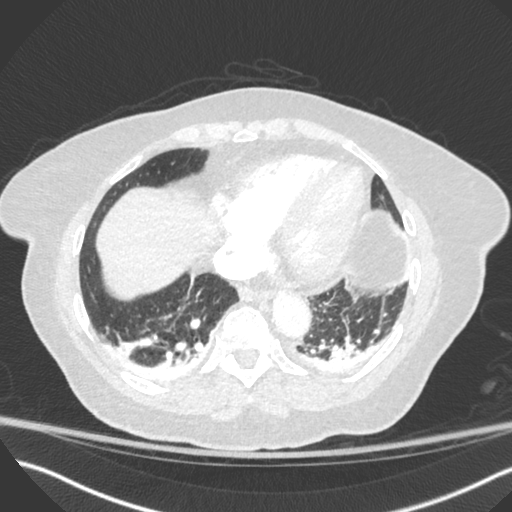
[im 194/498  soft-tissue]
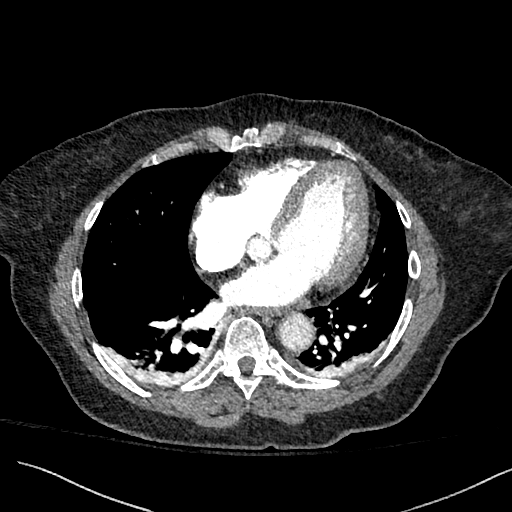
[im 221/498  lung]
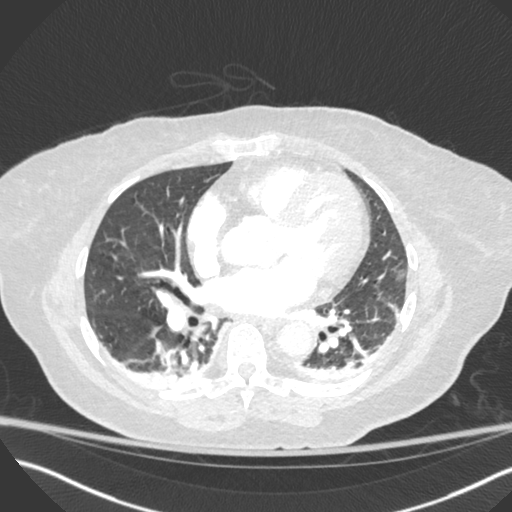
[im 249/498  soft-tissue]
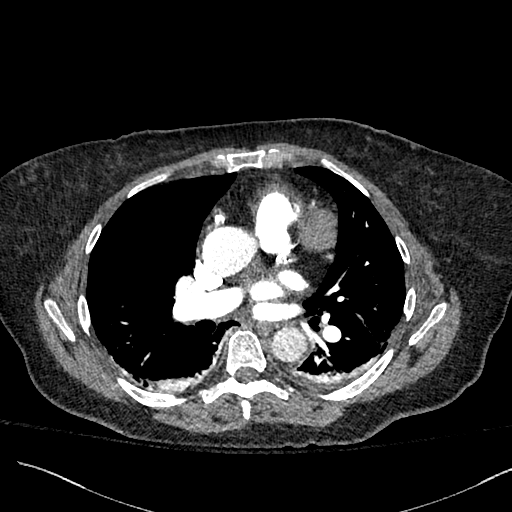
[im 277/498  lung]
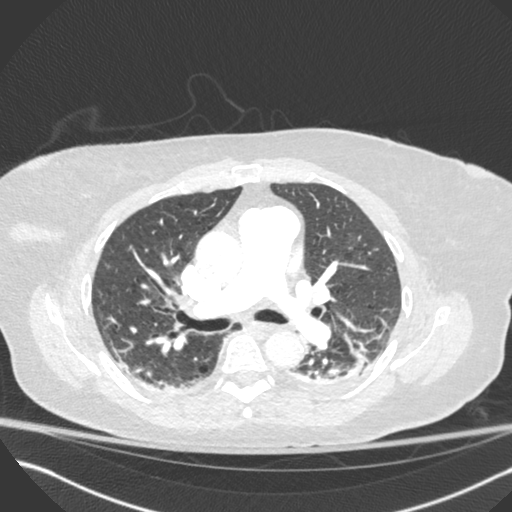
[im 304/498  soft-tissue]
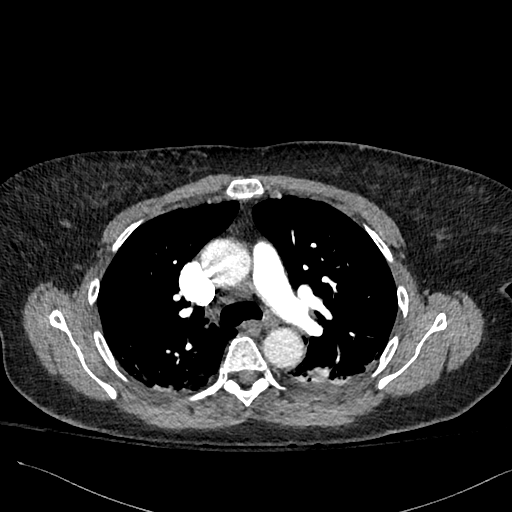
[im 332/498  lung]
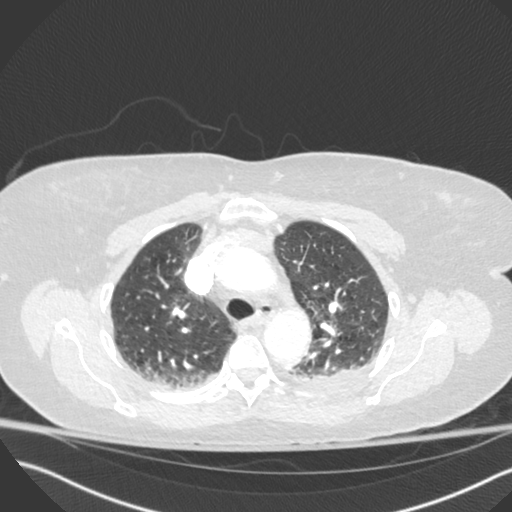
[im 387/498  soft-tissue]
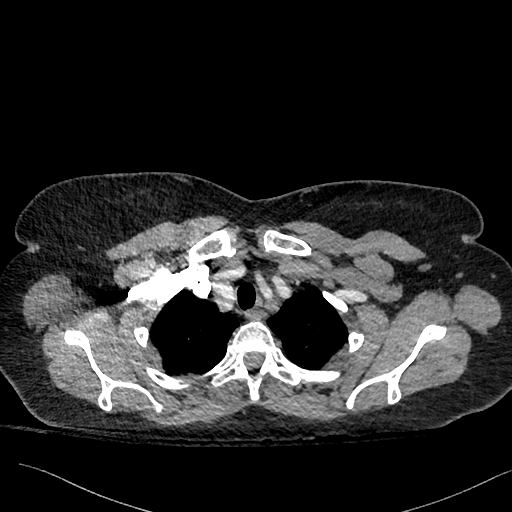
[im 415/498  lung]
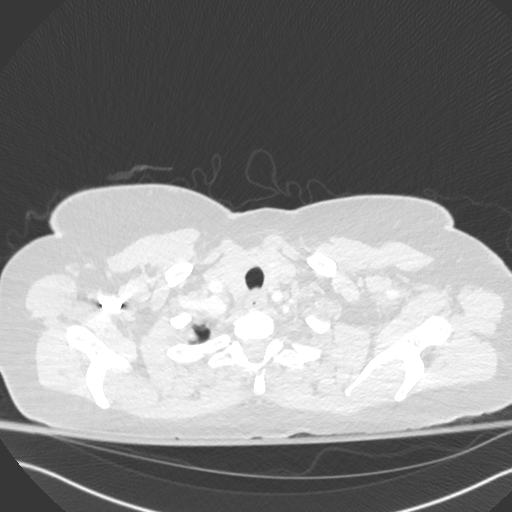
[im 442/498  soft-tissue]
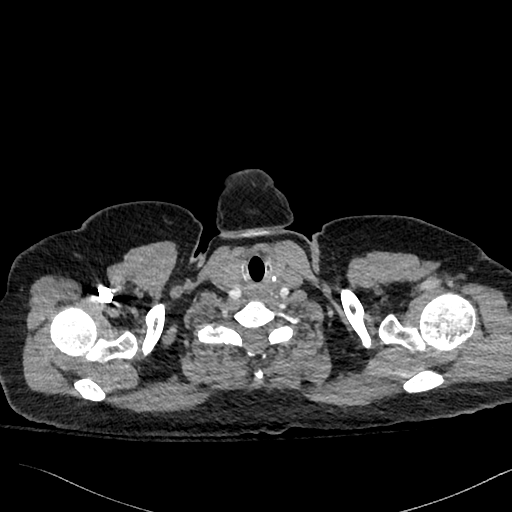
[im 470/498  lung]
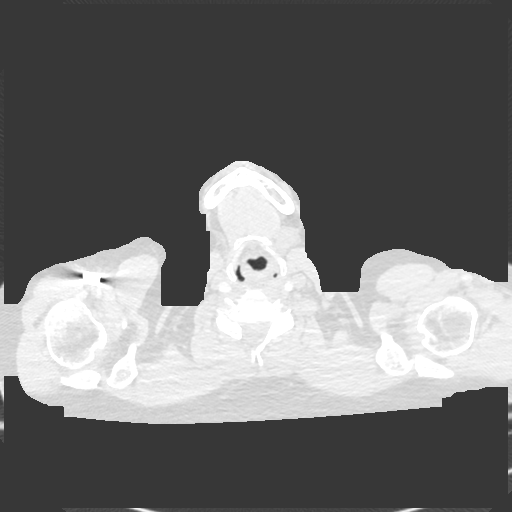

[Series 8: cor · coronal · 0.69mm/px · 3 of 136 slices shown]
[im 34/136  soft-tissue]
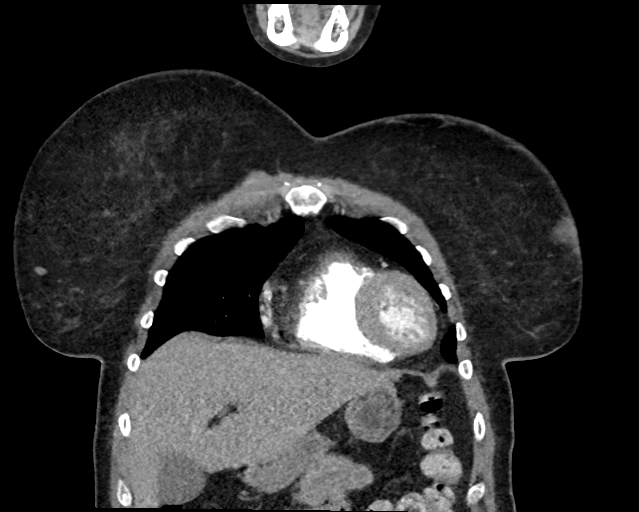
[im 68/136  soft-tissue]
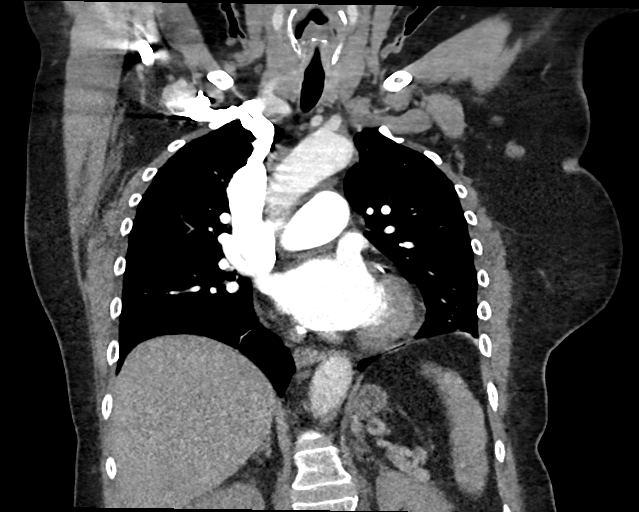
[im 102/136  soft-tissue]
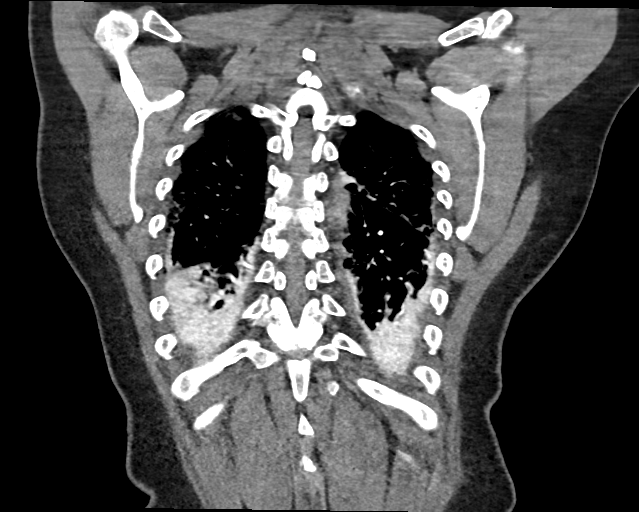

[18 of 46 positions shown; findings below may reference images not displayed]

FINDINGS: Cardiovascular: Thoracic aorta and its branches are well visualized
without evidence of aneurysmal dilatation or dissection. Mild
atherosclerotic calcifications are seen. Coronary calcifications are
noted. No significant cardiac enlargement is seen. The pulmonary
artery shows a normal branching pattern bilaterally. No filling
defect to suggest pulmonary embolism is noted.

Mediastinum/Nodes: Thoracic inlet is within normal limits. No
sizable hilar or mediastinal adenopathy is noted. The esophagus as
visualized is within normal limits.

Lungs/Pleura: Lungs demonstrate emphysematous change. Bilateral
lower lobe atelectatic changes are noted with minimal pleural
effusions similar to that seen on prior plain film examination. No
sizable parenchymal nodule is noted. No focal confluent infiltrate
is seen.

Upper Abdomen: Visualized upper abdomen shows no acute abnormality.

Musculoskeletal: Degenerative changes of the thoracic spine are
seen. No compression deformity is noted. No definitive rib fracture
abnormality is noted.

Review of the MIP images confirms the above findings.
IMPRESSION: No evidence of pulmonary emboli.

Bibasilar atelectatic changes with small effusions.

Aortic Atherosclerosis ([QA]-[QA]) and Emphysema ([QA]-[QA]).

## 2021-01-29 IMAGING — CR DG TIBIA/FIBULA 2V*L*
4 series · 4 of 4 positions shown · non-contrast
Comparison: [DATE]

CLINICAL DATA: Lower left leg pain.

EXAM:
LEFT TIBIA AND FIBULA - 2 VIEW

[tibia ap (1 of 2)]
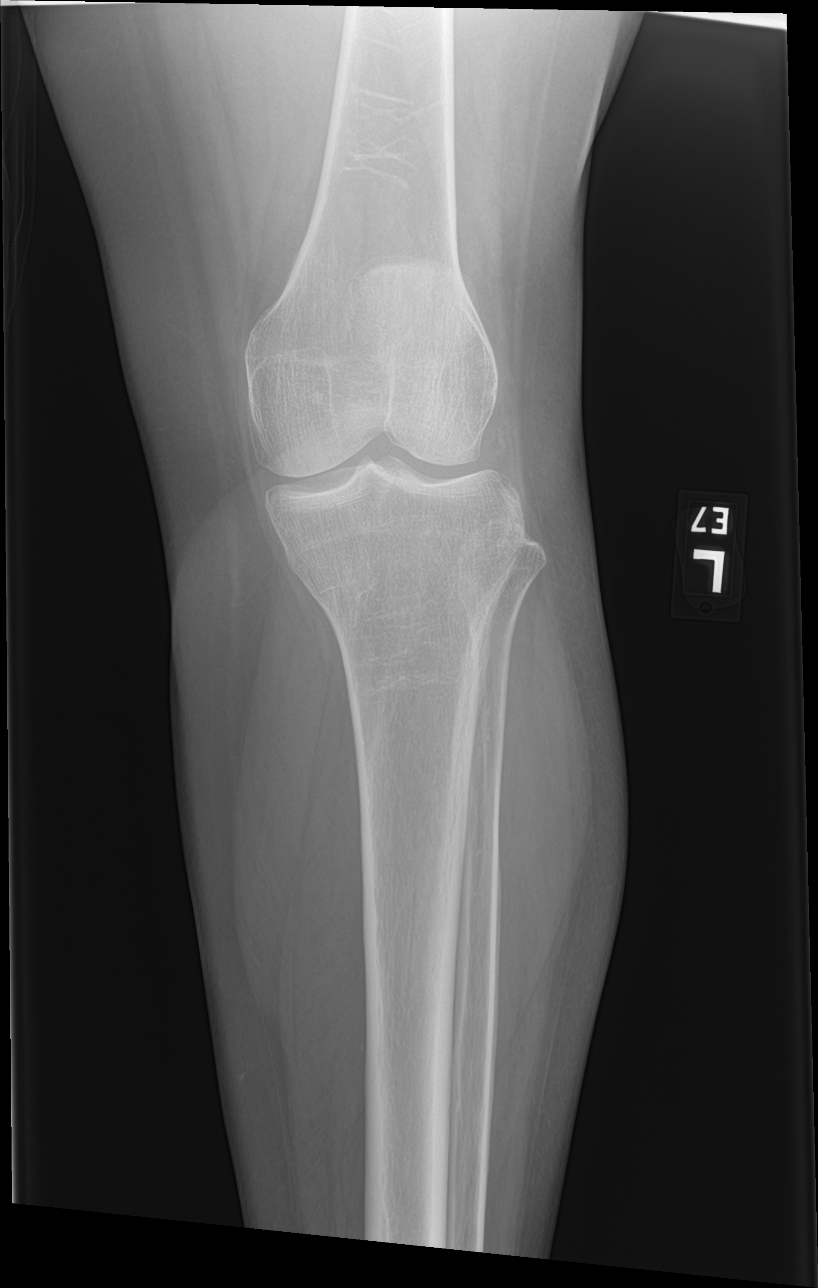

[tibia ap (2 of 2)]
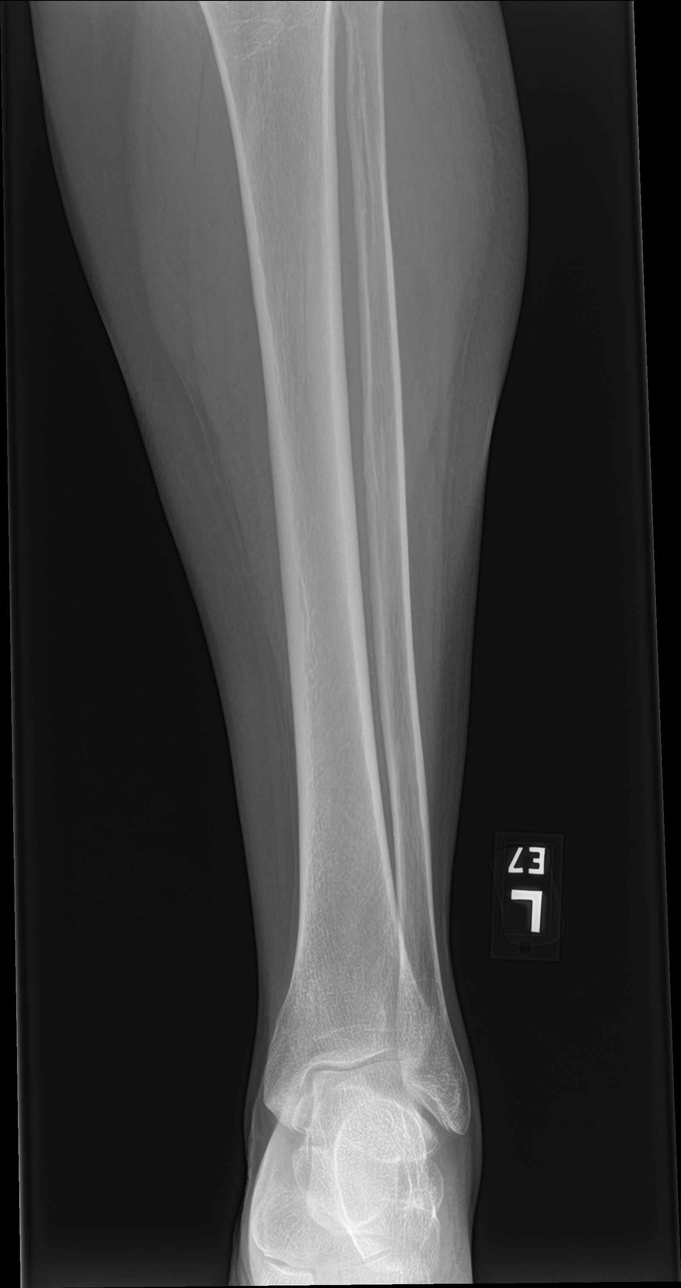

[tibia lat (1 of 2)]
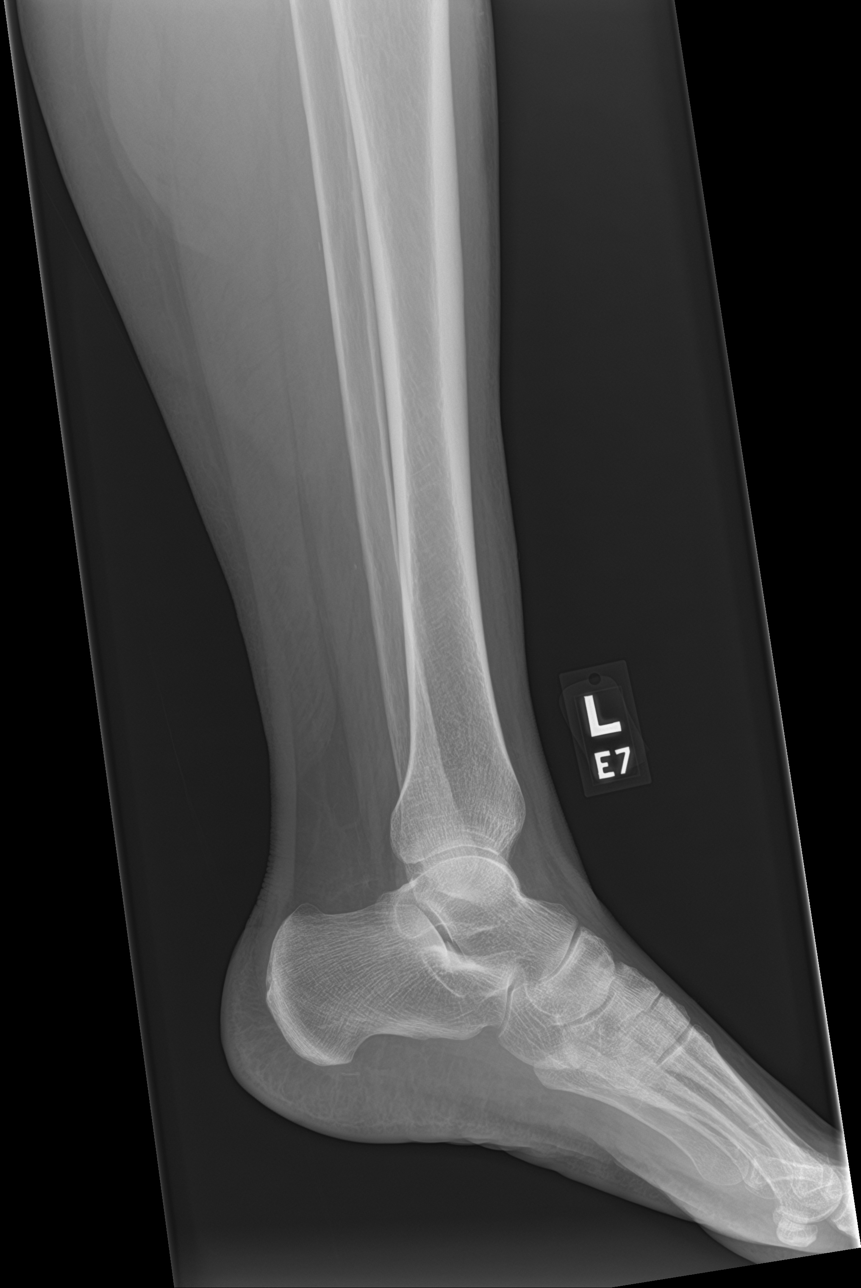

[tibia lat (2 of 2)]
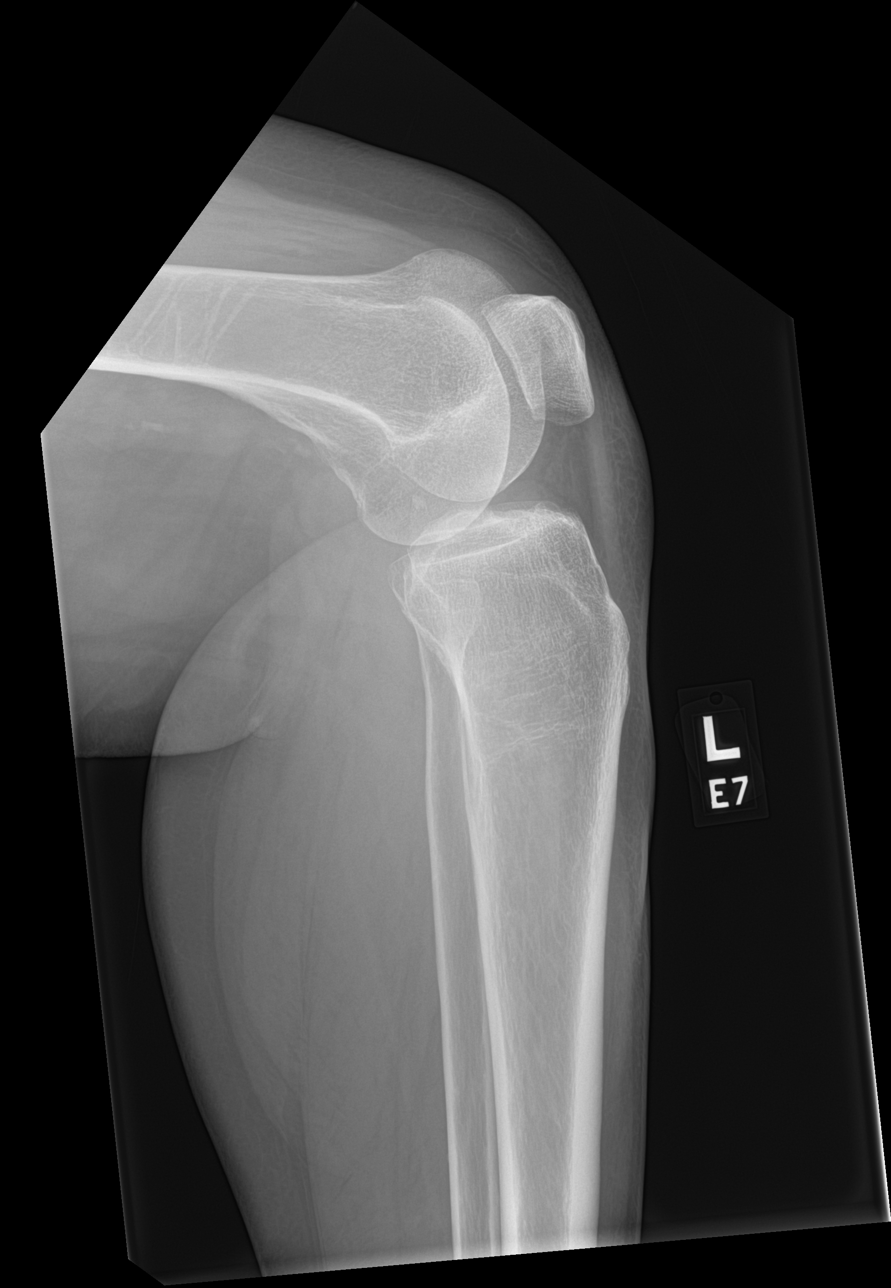

[4 of 4 positions shown; findings below may reference images not displayed]

FINDINGS: There is no evidence of fracture or other focal bone lesions. Soft
tissues are unremarkable.
IMPRESSION: Negative.

## 2021-01-29 IMAGING — CR DG CHEST 2V
2 series · 2 of 2 positions shown · non-contrast
Comparison: [DATE]

CLINICAL DATA: Hypoxia

EXAM:
CHEST - 2 VIEW

[chest lat]
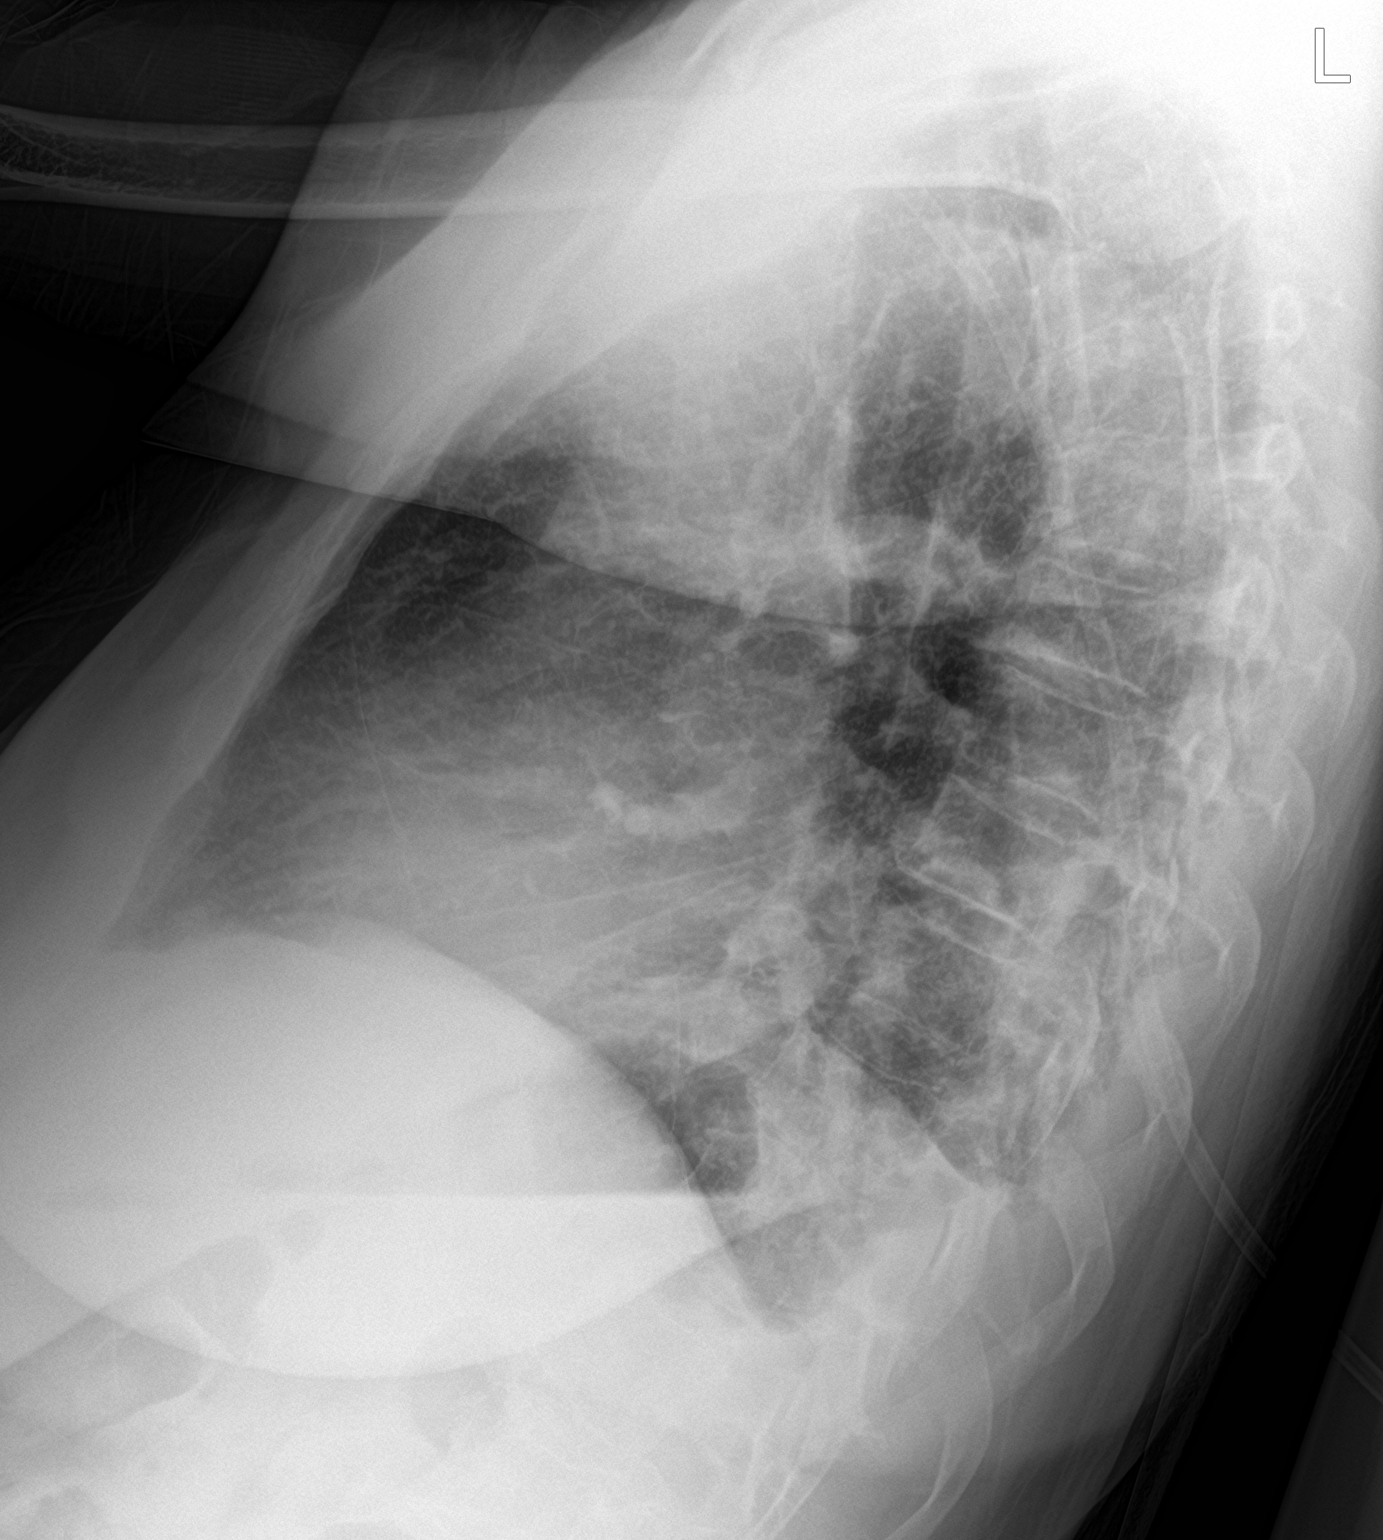

[chest ap]
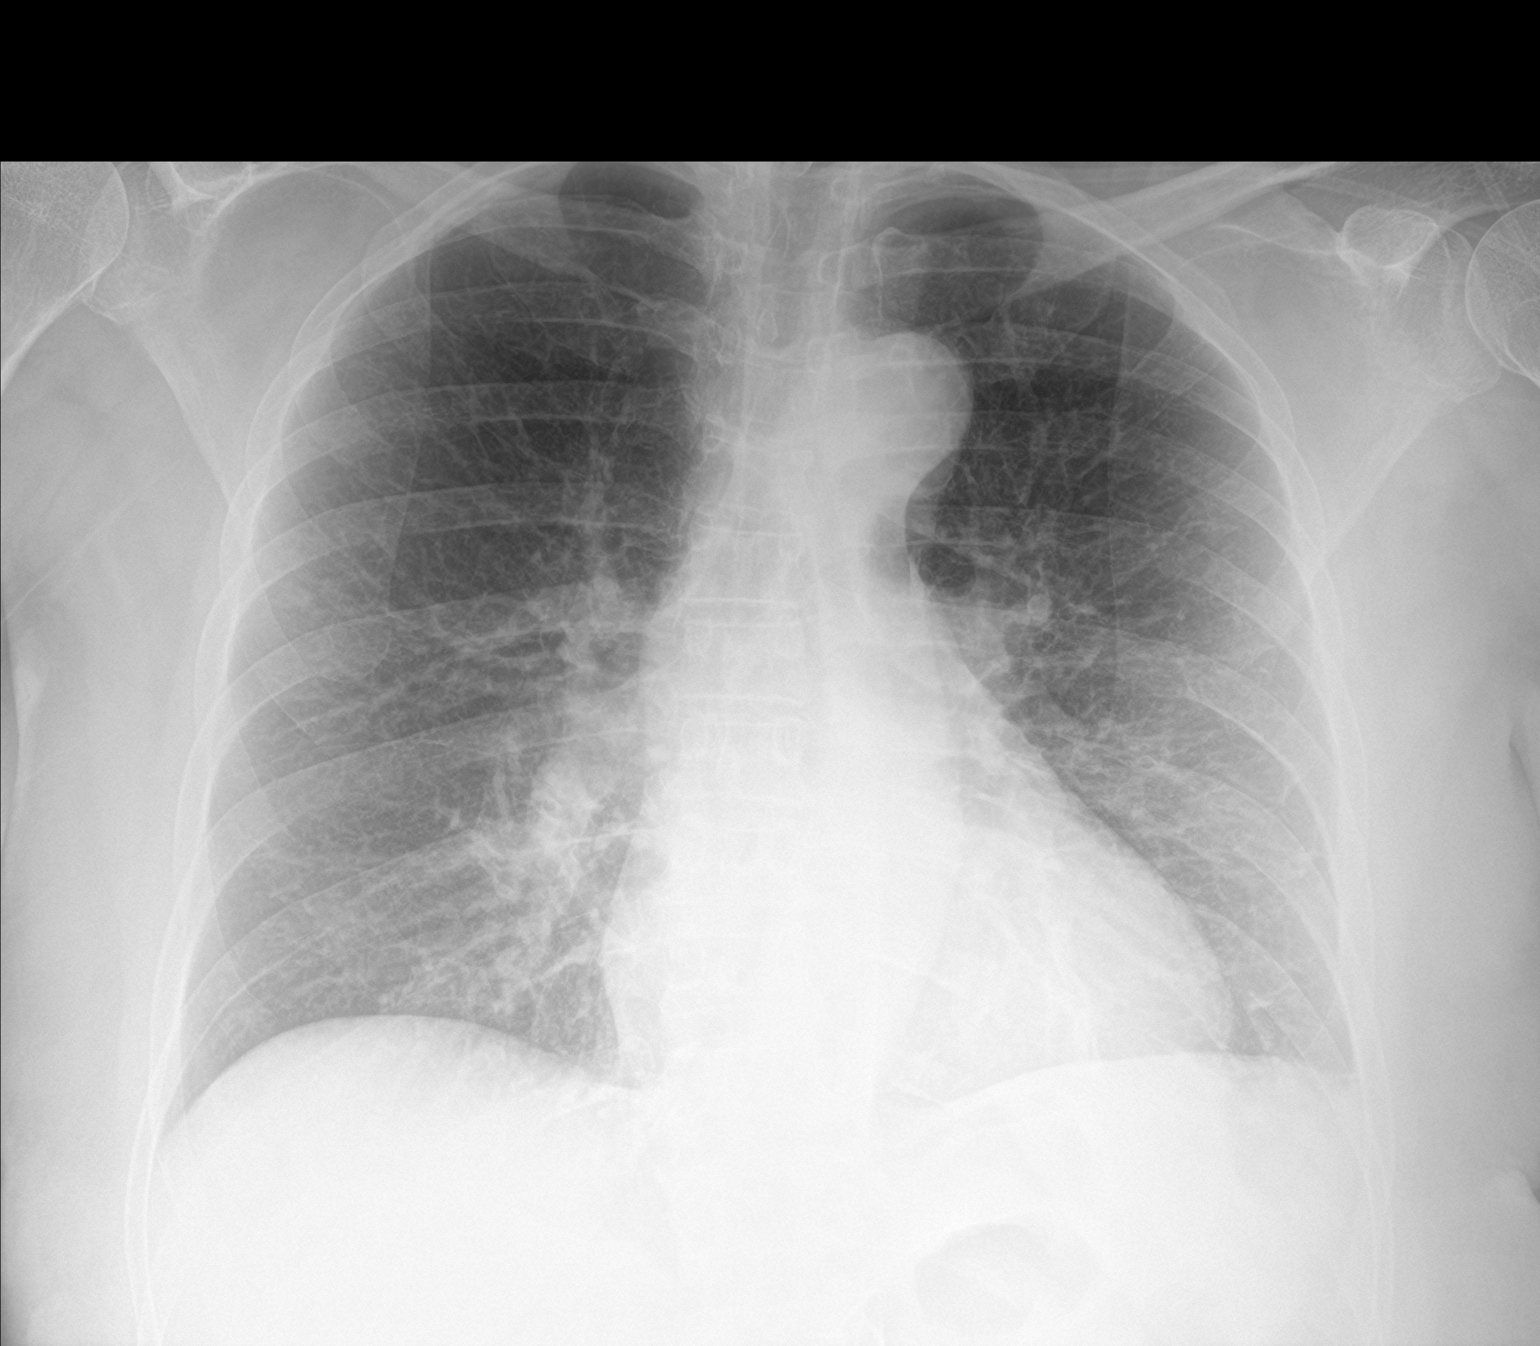

[2 of 2 positions shown; findings below may reference images not displayed]

FINDINGS: Cardiac shadow is stable. Lungs are well aerated bilaterally. Small
posterior effusions are noted. Additionally there is focal
infiltrate in what appears to be the right lower lobe posteriorly.
No bony abnormality is seen.
IMPRESSION: Right lower lobe infiltrate and small associated pleural effusions
bilaterally.

## 2021-01-29 IMAGING — MR MR LUMBAR SPINE W/O CM
4 of 5 series · 18 of 48 positions shown · non-contrast
Comparison: X-ray [DATE], MRI [DATE]

CLINICAL DATA: low back pain, progressive neurologic deficit

EXAM:
MRI LUMBAR SPINE WITHOUT CONTRAST
TECHNIQUE: Multiplanar, multisequence MR imaging of the lumbar spine was
performed. No intravenous contrast was administered.

[Series 3: T2 · sagittal · 4.0mm · 0.55mm/px · 6 of 17 slices shown (1 of 2)]
[im 1/17]
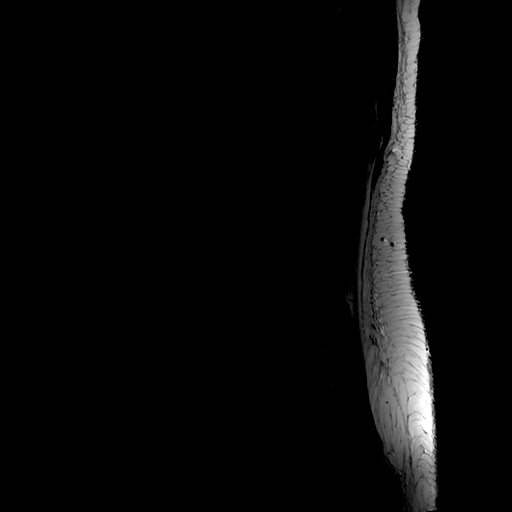
[im 4/17]
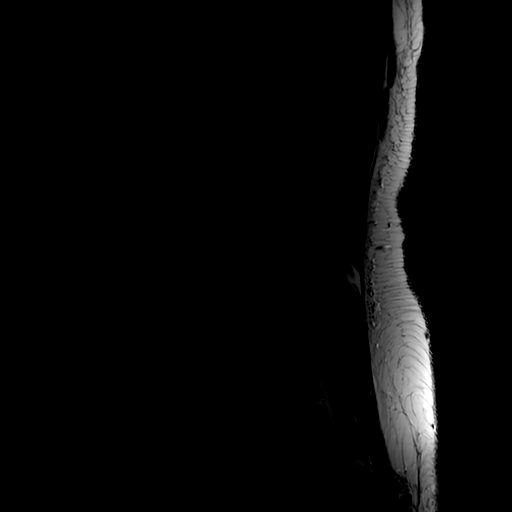
[im 7/17]
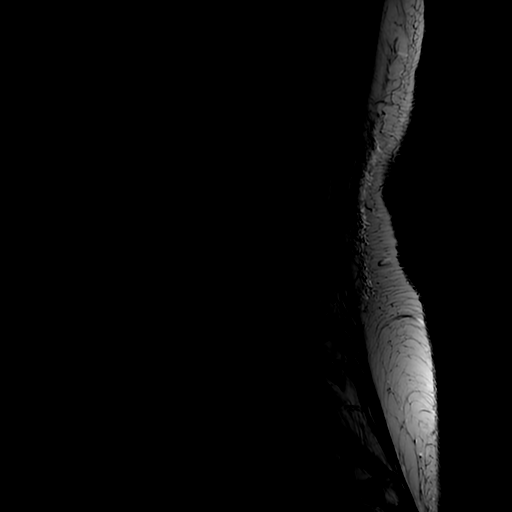
[im 10/17]
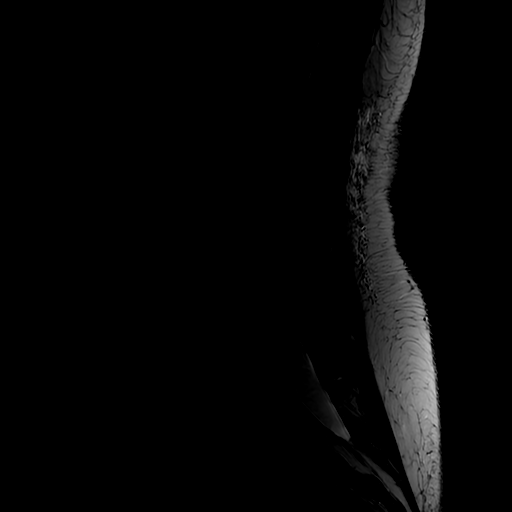
[im 13/17]
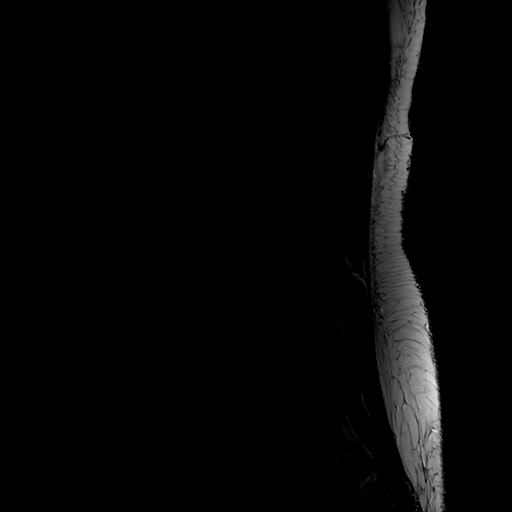
[im 17/17]
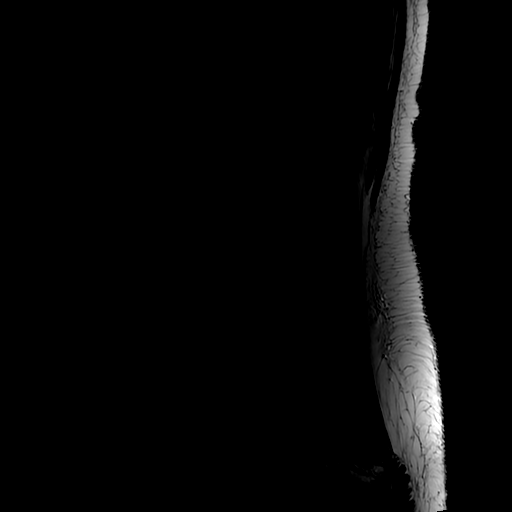

[Series 5: T1 · sagittal · 4.0mm · 0.55mm/px · 3 of 17 slices shown (1 of 2)]
[im 4/17]
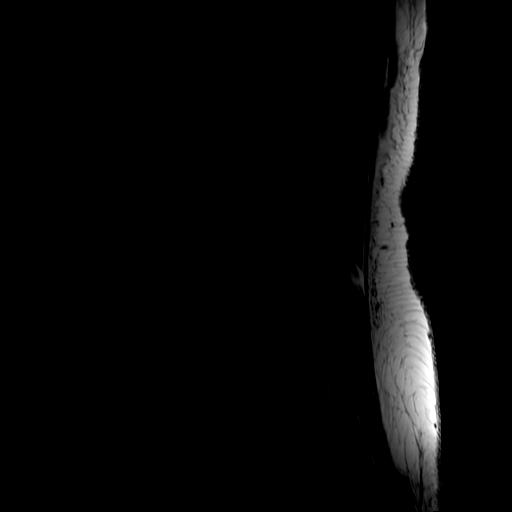
[im 10/17]
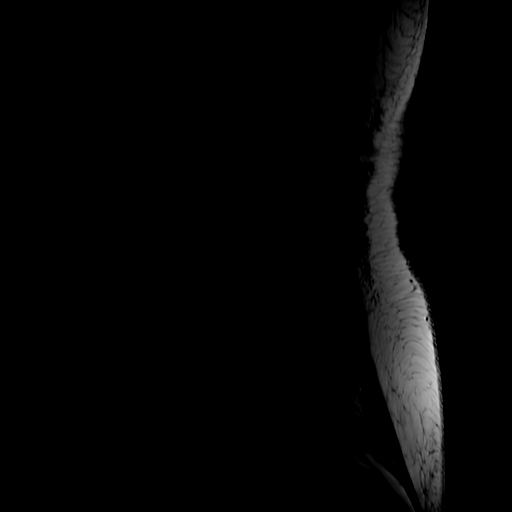
[im 17/17]
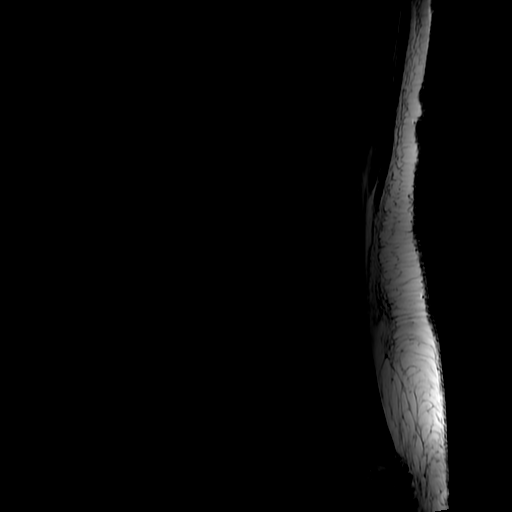

[Series 6: T2 · axial · 4.0mm · 0.39mm/px · z∈[-223,-46]mm · 6 of 41 slices shown (2 of 2)]
[im 1/41]
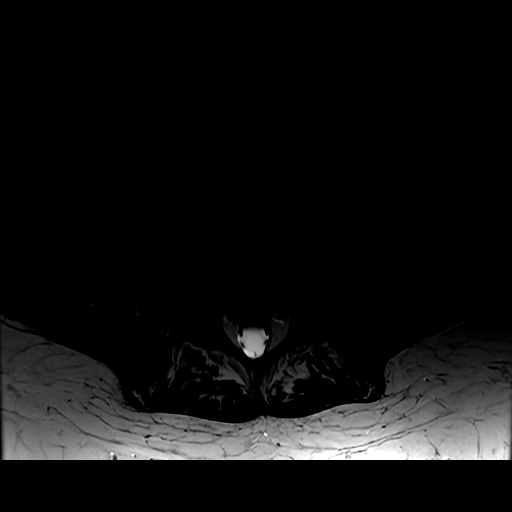
[im 6/41]
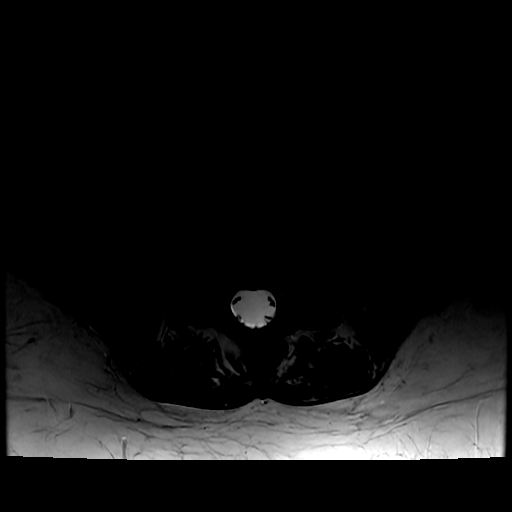
[im 12/41]
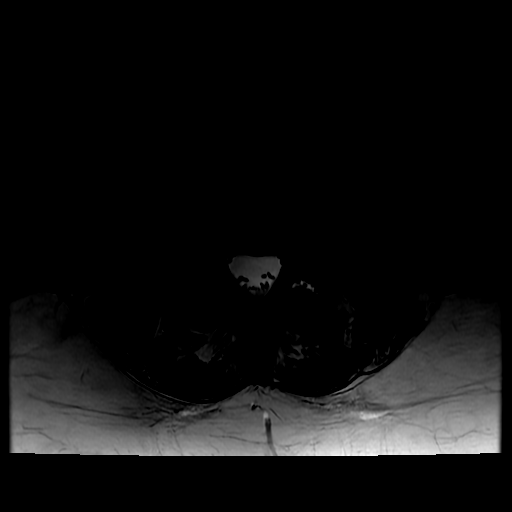
[im 18/41]
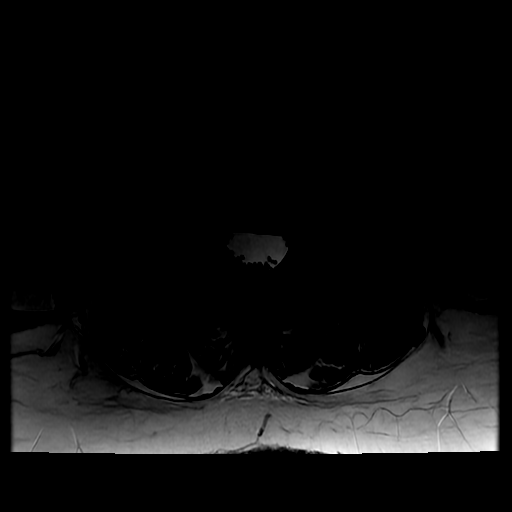
[im 21/41]
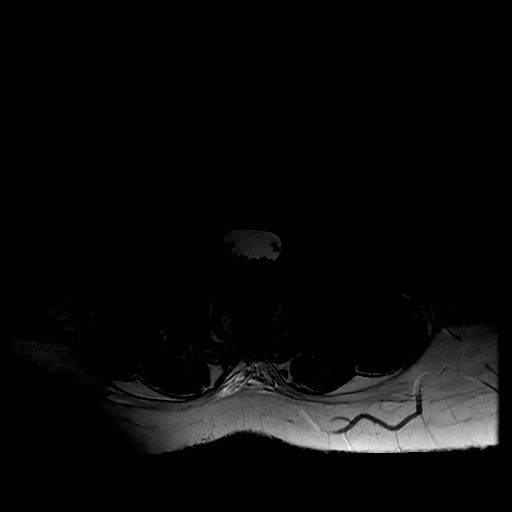
[im 35/41]
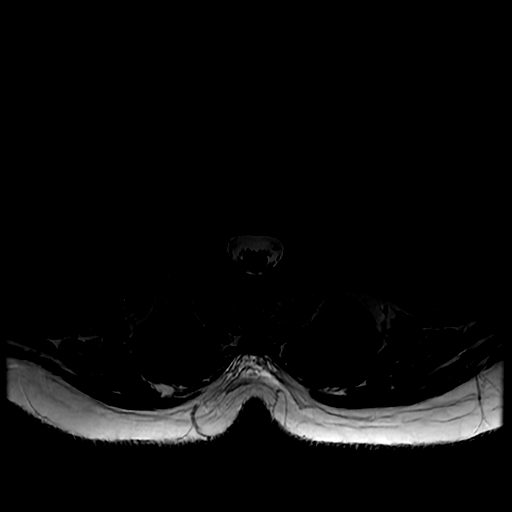

[Series 7: T1 · axial · 4.0mm · 0.39mm/px · z∈[-198,-46]mm · 3 of 41 slices shown (2 of 2)]
[im 6/41]
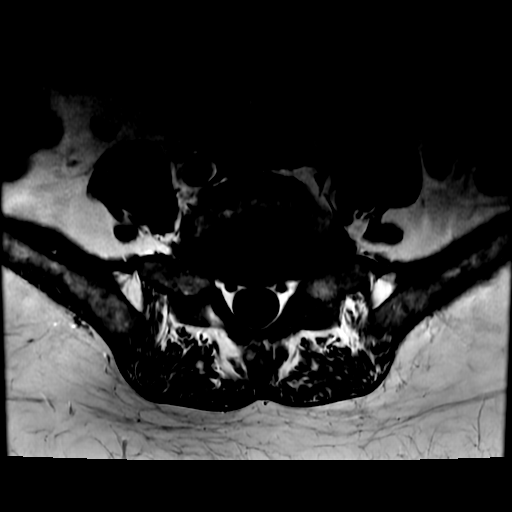
[im 21/41]
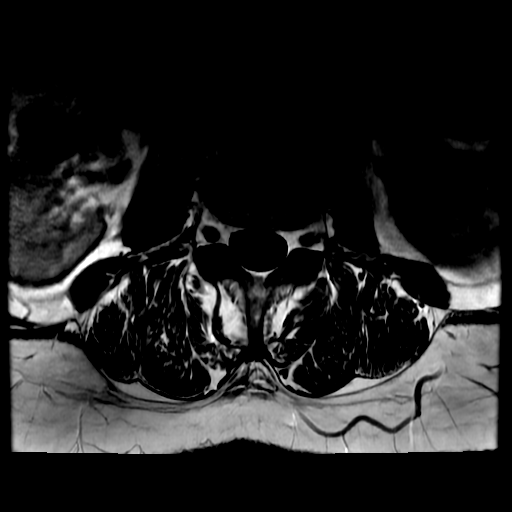
[im 35/41]
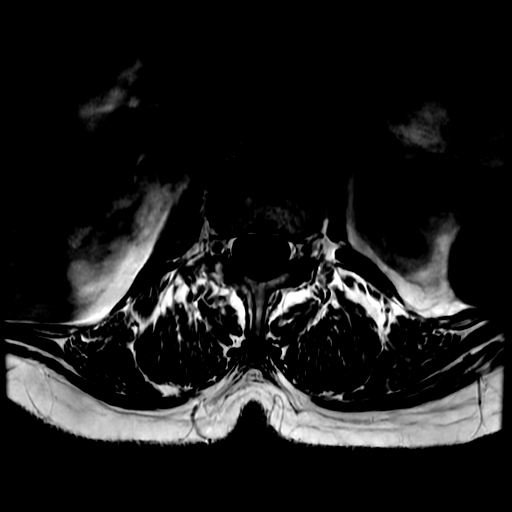

[18 of 48 positions shown; findings below may reference images not displayed]

FINDINGS: Segmentation:  Standard.

Alignment:  Physiologic.

Vertebrae: No acute fracture. No evidence of discitis. Diffuse
marrow heterogeneity without discrete marrow replacing lesion.
Findings are nonspecific but can be seen in the setting of chronic
anemia, smoking, and/or obesity.

Conus medullaris and cauda equina: Conus extends to the L2 level.
Conus and cauda equina appear normal.

Paraspinal and other soft tissues: Elongated, slightly lobulated T2
homogeneously hyperintense structure within the retroperitoneum
adjacent to the abdominal aorta and posterior to the IVC measuring
up to 10.6 x 1.7 x 2.6 cm (series 4, images 4-6), likely
representing a lymphatic malformation. This appears increased in
size compared to the previous MRI.

Disc levels:

T12-L1: Unremarkable.

L1-L2: Unremarkable.

L2-L3: Unremarkable.

L3-L4: Mild disc desiccation and minimal annular disc bulge. Mild
bilateral facet arthropathy. No foraminal or canal stenosis.
Unchanged.

L4-L5: Disc desiccation and mild annular disc bulge. Moderate
bilateral facet arthropathy with small bilateral facet joint
effusions. No foraminal or canal stenosis. Unchanged.

L5-S1: Disc height loss with diffuse disc bulge. Mild bilateral
facet arthropathy. Mild-to-moderate left and mild right foraminal
stenosis. No canal stenosis. Findings may be minimally progressed
compared to the previous study.
IMPRESSION: 1. Mild degenerative changes of the lumbar spine greatest at L5-S1
where there is mild-to-moderate left and mild right foraminal
stenosis.
2. No canal stenosis at any level.
3. Incidental note of an elongated cystic structure within the
retroperitoneum adjacent to the abdominal aorta measuring up to
x 1.7 x 2.6 cm, likely representing a lymphatic malformation. This
appears increased in size compared to the previous MRI. Findings are
most likely incidental and of doubtful clinical significance. If
clinically desired, a follow-up MRI of the abdomen with contrast
could be obtained in 1 year to assess stability. Left
4. Diffuse marrow heterogeneity without discrete marrow replacing
lesion. Findings are nonspecific but can be seen in the setting of
chronic anemia, smoking, and/or obesity.

## 2021-01-29 MED ORDER — SODIUM CHLORIDE 0.9 % IV SOLN
500.0000 mg | Freq: Once | INTRAVENOUS | Status: AC
  Administered 2021-01-30: 500 mg via INTRAVENOUS
  Filled 2021-01-29: qty 500

## 2021-01-29 MED ORDER — LORAZEPAM 2 MG/ML IJ SOLN
1.0000 mg | Freq: Once | INTRAMUSCULAR | Status: AC
  Administered 2021-01-29: 1 mg via INTRAVENOUS
  Filled 2021-01-29: qty 1

## 2021-01-29 MED ORDER — MORPHINE SULFATE (PF) 4 MG/ML IV SOLN: 8.0000 mg | Freq: Once | INTRAVENOUS | Status: DC

## 2021-01-29 MED ORDER — ACETAMINOPHEN 325 MG PO TABS
650.0000 mg | ORAL_TABLET | Freq: Once | ORAL | Status: AC
  Administered 2021-01-29: 650 mg via ORAL
  Filled 2021-01-29: qty 2

## 2021-01-29 MED ORDER — MORPHINE SULFATE (PF) 4 MG/ML IV SOLN
4.0000 mg | Freq: Once | INTRAVENOUS | Status: AC
Start: 2021-01-29 — End: 2021-01-29
  Administered 2021-01-29: 4 mg via INTRAVENOUS
  Filled 2021-01-29: qty 1

## 2021-01-29 MED ORDER — KETOROLAC TROMETHAMINE 15 MG/ML IJ SOLN
15.0000 mg | Freq: Once | INTRAMUSCULAR | Status: AC
  Administered 2021-01-29: 15 mg via INTRAVENOUS
  Filled 2021-01-29: qty 1

## 2021-01-29 MED ORDER — ONDANSETRON HCL 4 MG/2ML IJ SOLN
4.0000 mg | Freq: Once | INTRAMUSCULAR | Status: AC
  Administered 2021-01-29: 4 mg via INTRAVENOUS
  Filled 2021-01-29: qty 2

## 2021-01-29 MED ORDER — MORPHINE SULFATE (PF) 4 MG/ML IV SOLN
4.0000 mg | Freq: Once | INTRAVENOUS | Status: AC
  Administered 2021-01-29: 4 mg via INTRAVENOUS
  Filled 2021-01-29: qty 1

## 2021-01-29 MED ORDER — METHOCARBAMOL 500 MG PO TABS
500.0000 mg | ORAL_TABLET | Freq: Once | ORAL | Status: AC
  Administered 2021-01-29: 500 mg via ORAL
  Filled 2021-01-29: qty 1

## 2021-01-29 MED ORDER — SODIUM CHLORIDE 0.9 % IV SOLN
1.0000 g | Freq: Once | INTRAVENOUS | Status: AC
  Administered 2021-01-29: 1 g via INTRAVENOUS
  Filled 2021-01-29: qty 10

## 2021-01-29 MED ORDER — OXYCODONE-ACETAMINOPHEN 5-325 MG PO TABS
1.0000 | ORAL_TABLET | Freq: Once | ORAL | Status: AC
  Administered 2021-01-29: 1 via ORAL
  Filled 2021-01-29: qty 1

## 2021-01-29 NOTE — Subjective & Objective (Addendum)
CC: leg pain HPI: 60 yo AAF with hx of HTN, hypothyroidism, hyperlipidemia, presents to ER with c/o left leg pain. Pt was seen in [facility] ER on 01-26-2021 for complaints of leg pain.  LE U/S negative for DVT on 01-26-2021.  Pt discharged to home. Pt came back to [facility] ER today due to continued c/o of leg pain. Workup in ER essentially negative. MRI back did not show any spinal stenosis.  Pt reportedly cough for several days but this is not recorded on ER visit on 01-26-2021 or today.  Pt states she had had cough for about 5-7 days when her left leg started to hurt. Also states her husband and dtr both have covid. She has had 2 vaccination for covid but not booster shot.  Pt dx'd with covid on 01-22-2021.  During workup in ER, pt noted to be hypoxic with RA sats 85%.  CXR showed RLL pneumonia. CTPA ordered but pending at time admission was called.  [facility] hospitalist consulted for admission.

## 2021-01-29 NOTE — ED Provider Notes (Signed)
Emergency Medicine Provider Triage Evaluation Note  Jessica Cooke , a 60 y.o. female  was evaluated in triage.  Pt complains of leg pain.  Review of Systems  Positive: Left leg pain, back pain Negative: Fever, numbness, weakness, bowel/bladder incontinence, saddle anesthesia  Physical Exam  BP 123/78 (BP Location: Right Arm)   Pulse 95   Temp 99 F (37.2 C) (Oral)   Resp 16   Ht 5\' 9"  (1.753 m)   Wt 98.9 kg   SpO2 94%   BMI 32.20 kg/m  Gen:   Awake, uncomfortable Resp:  Normal effort  MSK:   Moves extremities without difficulty  Other:    Medical Decision Making  Medically screening exam initiated at 12:13 PM.  Appropriate orders placed.  Javona Bergevin was informed that the remainder of the evaluation will be completed by another provider, this initial triage assessment does not replace that evaluation, and the importance of remaining in the ED until their evaluation is complete.  Pt here with lower back pain and left leg pain x 5 days not adequately control with home pain medication.  Had been seen in the ER a few days prior, had negative DVT study.    Jessica Jobs, PA-C 01/29/21 1218    01/31/21, MD 01/29/21 [phone]

## 2021-01-29 NOTE — Assessment & Plan Note (Signed)
Stable

## 2021-01-29 NOTE — Telephone Encounter (Signed)
Patient is calling with severe leg pain- patient reports she is unable to walk. Patient states she was diagnosed with COVID 1 week ago. Patient states her leg pain started Tuesday- she was doing overall- better with her COVID symptoms. Patient had to call EMS- but did not go to ED- due to wait. Patient was seen at ED- [facility] on Wednesday and diagnosed with low Calcium level and she has been taking oral calcium to try to bring her level up. Patient advised ED for evaluation of her symptoms.

## 2021-01-29 NOTE — ED Notes (Signed)
Patient transported to MRI 

## 2021-01-29 NOTE — Assessment & Plan Note (Signed)
Nicotine patch ordered.

## 2021-01-29 NOTE — Telephone Encounter (Signed)
Patient is calling with severe leg pain. Reason for Disposition  Unable to walk  Answer Assessment - Initial Assessment Questions 1. ONSET: "When did the pain start?"      Tuesday night 2. LOCATION: "Where is the pain located?"      Left leg- up to thigh  3. PAIN: "How bad is the pain?"    (Scale 1-10; or mild, moderate, severe)   -  MILD (1-3): doesn't interfere with normal activities    -  MODERATE (4-7): interferes with normal activities (e.g., work or school) or awakens from sleep, limping    -  SEVERE (8-10): excruciating pain, unable to do any normal activities, unable to walk     severe 4. WORK OR EXERCISE: "Has there been any recent work or exercise that involved this part of the body?"      no 5. CAUSE: "What do you think is causing the leg pain?"     Unsure- low calcium level 6. OTHER SYMPTOMS: "Do you have any other symptoms?" (e.g., chest pain, back pain, breathing difficulty, swelling, rash, fever, numbness, weakness)     no 7. PREGNANCY: "Is there any chance you are pregnant?" "When was your last menstrual period?"     N/a  Protocols used: Leg Pain-A-AH

## 2021-01-29 NOTE — ED Notes (Signed)
Unable to obtain IV at this time. Another RN to attempt.

## 2021-01-29 NOTE — ED Notes (Signed)
Patient transported to X-ray 

## 2021-01-29 NOTE — Assessment & Plan Note (Signed)
Admit to med bed observation. IV rocephin, zithromax. Check legionella, strep pneumo. Repeat labs in AM. EDP checking CTPA to rule out PE.

## 2021-01-29 NOTE — Assessment & Plan Note (Signed)
Continue with supplemental O2 

## 2021-01-29 NOTE — ED Provider Notes (Signed)
Care of patient assumed from Dr. Lynelle Doctor at 3:30 PM.  This patient presents for persistent lower back pain that radiates down her entire left leg for the past 5 days.  She was seen at [facility] 3 days ago.  At that time, she had a negative DVT study.  She was noted to have low calcium and was prescribed calcium supplementation.  She presents today due to uncontrolled left leg pain.  Currently, MRI of lumbar spine is ordered. Physical Exam  BP 135/79   Pulse 68   Temp 99 F (37.2 C) (Oral)   Resp 15   Ht 5\' 9"  (1.753 m)   Wt 98.9 kg   SpO2 95%   BMI 32.20 kg/m   Physical Exam Constitutional:      General: She is not in acute distress.    Appearance: Normal appearance. She is not ill-appearing, toxic-appearing or diaphoretic.  HENT:     Head: Normocephalic and atraumatic.     Right Ear: External ear normal.     Left Ear: External ear normal.     Mouth/Throat:     Mouth: Mucous membranes are moist.  Cardiovascular:     Rate and Rhythm: Normal rate and regular rhythm.  Pulmonary:     Effort: Pulmonary effort is normal. No respiratory distress.     Breath sounds: Normal breath sounds. No wheezing.  Skin:    General: Skin is warm and dry.     Capillary Refill: Capillary refill takes less than 2 seconds.  Neurological:     Mental Status: She is alert.  Psychiatric:        Mood and Affect: Mood normal.        Behavior: Behavior normal.    ED Course/Procedures     Procedures  MDM  While in the MRI scanner, patient requested more medicine for pain control.  Additional dose of morphine was given.  Upon her return to the ED, she endorsed continued, but improved, pain in her distal, lateral left lower extremity.  This area is not significantly tender to palpation.  She states that the pain is worsened with weightbearing.  Additional nonnarcotic pain medications were ordered.  Patient has undergone DVT study of her left leg, MRI of her lumbar spine.  She has not, at this point, had an  x-ray.  X-ray of tib-fib area was ordered.  Images showed no acute findings.  On reassessment, patient reports continued improvement in left leg pain.  It was at this point, however, that patient was found to be hypoxic.  Given that she has gotten narcotic pain medication in addition to Ativan, this could be secondary to decreased her shallow respirations from medication effect.  She was sat up and encouraged to take deep breaths.  She remained hypoxic with SPO2 in the mid 80s on room air.  She was placed on supplemental oxygen by nasal cannula.  A chest x-ray was performed which showed concern for pneumonia.  When questioned about recent symptoms, other than the leg pain, patient does state that she has been laying on her back coughing for the past 3 days.  Patient was treated for community-acquired pneumonia.  Given her oxygen requirement, patient was admitted for further management.       , MD 01/30/21 [phone]

## 2021-01-29 NOTE — ED Notes (Signed)
Pain medication brought to pt in MRI.

## 2021-01-29 NOTE — Telephone Encounter (Signed)
Summary: Call back request Jessica Cooke)   Pt wants to speak with Jessica Cooke, please advise     Patient is calling from ED triage- the connection is bad and it is hard to hear her- encouraged her to stay at ED and let them help her with her pain. She responded OK. Call disconnected. Reason for Disposition . Unable to walk  Protocols used: Leg Pain-A-AH

## 2021-01-29 NOTE — ED Triage Notes (Signed)
Pt arrives via EMS from home with reports of left leg pain (lower leg and up to thigh) since Tuesday. States it started after taking covid medication. Reports she had an [place] and blood work done.

## 2021-01-29 NOTE — Assessment & Plan Note (Signed)
Stable. Continue synthroid. °

## 2021-01-29 NOTE — ED Provider Notes (Signed)
[facility] EMERGENCY DEPARTMENT Provider Note   CSN: [id number] Arrival date & time: 01/29/21  1135     History Chief Complaint  Patient presents with   Leg Pain    Jessica Cooke is a 60 y.o. female.   Leg Pain  Patient presents to the ER with complaints of severe leg pain.  Patient also has been having back pain.  Patient states initially her symptoms started several days ago, almost a week.  Patient was diagnosed with COVID on the seventh and started medications for that.  She had a work-up on the 11th that included a Doppler study that did not show any evidence of DVT.  She was noted to have mild hyponatremia and hypocalcemia.  Patient was started on pain medications and suggested to follow-up with a primary care doctor.  She was also given calcium supplements.  Patient feels like the pain is getting worse.  She is having trouble even transferring now.  Patient tried calling the doctor's office today and was directed to come to the ER.  Patient states it is very painful when she moves her leg.  The pain goes down her entire leg.  Past Medical History:  Diagnosis Date   Eczema    Hypothyroid    Thyroid disease     Patient Active Problem List   Diagnosis Date Noted   Hypothyroidism    Insomnia    Spinal stenosis     Past Surgical History:  Procedure Laterality Date   ABDOMINAL HYSTERECTOMY       OB History   No obstetric history on file.     Family History  Problem Relation Age of Onset   Hypertension Mother    Hypertension Sister    Hypertension Brother     Social History   Tobacco Use   Smoking status: Every Day    Packs/day: 1.00    Types: Cigarettes   Smokeless tobacco: Never  Substance Use Topics   Alcohol use: Yes    Comment: rarely   Drug use: No    Home Medications Prior to Admission medications   Medication Sig Start Date End Date Taking? Authorizing Provider  calcium gluconate 500 MG tablet Take 1 tablet (500 mg  total) by mouth 2 (two) times daily. 01/26/21   Benjiman Core, MD  levothyroxine (SYNTHROID) 100 MCG tablet TAKE 1 TABLET(100 MCG) BY MOUTH DAILY BEFORE BREAKFAST 08/14/20   Hoy Register, MD  methocarbamol (ROBAXIN) 500 MG tablet TAKE 2 TABLETS (1,000 MG TOTAL) BY MOUTH EVERY 8 (EIGHT) HOURS AS NEEDED FOR MUSCLE SPASMS. 07/23/20 07/23/21  Grayce Sessions, NP  nicotine (NICODERM CQ) 21 mg/24hr patch Place 1 patch (21 mg total) onto the skin daily. 07/23/20   Grayce Sessions, NP  oxyCODONE-acetaminophen (PERCOCET/ROXICET) 5-325 MG tablet Take 1-2 tablets by mouth every 8 (eight) hours as needed for severe pain. 01/26/21   Benjiman Core, MD    Allergies    Dilaudid [hydromorphone hcl] and Tramadol  Review of Systems   Review of Systems  All other systems reviewed and are negative.  Physical Exam Updated Vital Signs BP 135/79   Pulse 68   Temp 99 F (37.2 C) (Oral)   Resp 15   Ht 1.753 m (5\' 9" )   Wt 98.9 kg   SpO2 95%   BMI 32.20 kg/m   Physical Exam Vitals and nursing note reviewed.  Constitutional:      General: She is in acute distress.     Appearance: She  is well-developed.  HENT:     Head: Normocephalic and atraumatic.     Right Ear: External ear normal.     Left Ear: External ear normal.  Eyes:     General: No scleral icterus.       Right eye: No discharge.        Left eye: No discharge.     Conjunctiva/sclera: Conjunctivae normal.  Neck:     Trachea: No tracheal deviation.  Cardiovascular:     Rate and Rhythm: Normal rate and regular rhythm.  Pulmonary:     Effort: Pulmonary effort is normal. No respiratory distress.     Breath sounds: Normal breath sounds. No stridor. No wheezing or rales.  Abdominal:     General: Bowel sounds are normal. There is no distension.     Palpations: Abdomen is soft.     Tenderness: There is no abdominal tenderness. There is no guarding or rebound.  Musculoskeletal:        General: No tenderness or deformity.      Cervical back: Neck supple.     Comments: Pain with straight leg raise, tenderness palpation left paraspinal region, , strong dorsalis pedal pulse  Skin:    General: Skin is warm and dry.     Findings: No rash.  Neurological:     General: No focal deficit present.     Mental Status: She is alert.     Cranial Nerves: No cranial nerve deficit (no facial droop, extraocular movements intact, no slurred speech).     Sensory: No sensory deficit.     Motor: Weakness present. No abnormal muscle tone or seizure activity.     Coordination: Coordination normal.     Comments: 4-5 leg extension sensation intact bilaterally  Psychiatric:        Mood and Affect: Mood normal.    ED Results / Procedures / Treatments   Labs (all labs ordered are listed, but only abnormal results are displayed) Labs Reviewed - No data to display  EKG None  Radiology No results found.  Procedures Procedures   Medications Ordered in ED Medications  morphine 4 MG/ML injection 8 mg (has no administration in time range)  oxyCODONE-acetaminophen (PERCOCET/ROXICET) 5-325 MG per tablet 1 tablet (1 tablet Oral Given 01/29/21 1222)    ED Course  I have reviewed the triage vital signs and the nursing notes.  Pertinent labs & imaging results that were available during my care of the patient were reviewed by me and considered in my medical decision making (see chart for details).    MDM Rules/Calculators/A&P                           Patient presented to the ED for evaluation of leg pain.  Recent evaluation did not show any signs of DVT.  On exam patient has strong pulses.  No signs to suggest vascular occlusion.  Exam does not suggest infection.  No mass appreciated.  CK does not show evidence of rhabdo.  Electrolyte panel shows potassium of 5.7 however there is slight hemolysis.  She does not have any renal dysfunction.  With her back and leg pain I am concerned about lumbar radiculopathy.  Patient states she is  having difficulty bearing weight..  We will proceed with MRI .  Care turned over to Dr Durwin Nora Final Clinical Impression(s) / ED Diagnoses pending    Linwood Dibbles, MD 01/29/21 1535

## 2021-01-30 ENCOUNTER — Encounter (HOSPITAL_COMMUNITY): Payer: Self-pay | Admitting: Internal Medicine

## 2021-01-30 ENCOUNTER — Other Ambulatory Visit: Payer: Self-pay

## 2021-01-30 DIAGNOSIS — M79605 Pain in left leg: Secondary | ICD-10-CM

## 2021-01-30 DIAGNOSIS — E038 Other specified hypothyroidism: Secondary | ICD-10-CM

## 2021-01-30 DIAGNOSIS — Z720 Tobacco use: Secondary | ICD-10-CM

## 2021-01-30 DIAGNOSIS — U071 COVID-19: Principal | ICD-10-CM

## 2021-01-30 DIAGNOSIS — I10 Essential (primary) hypertension: Secondary | ICD-10-CM

## 2021-01-30 DIAGNOSIS — J9601 Acute respiratory failure with hypoxia: Secondary | ICD-10-CM

## 2021-01-30 LAB — CBC WITH DIFFERENTIAL/PLATELET
Abs Immature Granulocytes: 0.07 10*3/uL (ref 0.00–0.07)
Basophils Absolute: 0 10*3/uL (ref 0.0–0.1)
Basophils Relative: 1 %
Eosinophils Absolute: 0 10*3/uL (ref 0.0–0.5)
Eosinophils Relative: 0 %
HCT: 47.9 % — ABNORMAL HIGH (ref 36.0–46.0)
Hemoglobin: 16.6 g/dL — ABNORMAL HIGH (ref 12.0–15.0)
Immature Granulocytes: 1 %
Lymphocytes Relative: 12 %
Lymphs Abs: 1 10*3/uL (ref 0.7–4.0)
MCH: 31.7 pg (ref 26.0–34.0)
MCHC: 34.7 g/dL (ref 30.0–36.0)
MCV: 91.4 fL (ref 80.0–100.0)
Monocytes Absolute: 0.2 10*3/uL (ref 0.1–1.0)
Monocytes Relative: 3 %
Neutro Abs: 6.6 10*3/uL (ref 1.7–7.7)
Neutrophils Relative %: 83 %
Platelets: 499 10*3/uL — ABNORMAL HIGH (ref 150–400)
RBC: 5.24 MIL/uL — ABNORMAL HIGH (ref 3.87–5.11)
RDW: 13.7 % (ref 11.5–15.5)
WBC: 8 10*3/uL (ref 4.0–10.5)
nRBC: 0 % (ref 0.0–0.2)

## 2021-01-30 LAB — COMPREHENSIVE METABOLIC PANEL
ALT: 15 U/L (ref 0–44)
AST: 13 U/L — ABNORMAL LOW (ref 15–41)
Albumin: 3.7 g/dL (ref 3.5–5.0)
Alkaline Phosphatase: 44 U/L (ref 38–126)
Anion gap: 10 (ref 5–15)
BUN: 8 mg/dL (ref 8–23)
CO2: 23 mmol/L (ref 22–32)
Calcium: 9 mg/dL (ref 8.9–10.3)
Chloride: 102 mmol/L (ref 98–111)
Creatinine, Ser: 0.74 mg/dL (ref 0.44–1.00)
GFR, Estimated: >60 mL/min (ref >60)
Glucose, Bld: 111 mg/dL — ABNORMAL HIGH (ref 70–99)
Potassium: 3.8 mmol/L (ref 3.5–5.1)
Sodium: 135 mmol/L (ref 135–145)
Total Bilirubin: 0.7 mg/dL (ref 0.3–1.2)
Total Protein: 6.5 g/dL (ref 6.5–8.1)

## 2021-01-30 LAB — RESP PANEL BY RT-PCR (FLU A&B, COVID) ARPGX2
Influenza A by PCR: NEGATIVE
Influenza B by PCR: NEGATIVE
SARS Coronavirus 2 by RT PCR: NEGATIVE

## 2021-01-30 LAB — LACTATE DEHYDROGENASE: LDH: 181 U/L (ref 98–192)

## 2021-01-30 LAB — FERRITIN: Ferritin: 139 ng/mL (ref 11–307)

## 2021-01-30 LAB — D-DIMER, QUANTITATIVE: D-Dimer, Quant: 3.05 ug/mL-FEU — ABNORMAL HIGH (ref 0.00–0.50)

## 2021-01-30 LAB — CK: Total CK: 98 U/L (ref 38–234)

## 2021-01-30 LAB — HIV ANTIBODY (ROUTINE TESTING W REFLEX): HIV Screen 4th Generation wRfx: NONREACTIVE

## 2021-01-30 LAB — C-REACTIVE PROTEIN: CRP: 0.6 mg/dL (ref <1.0)

## 2021-01-30 LAB — MAGNESIUM: Magnesium: 2.3 mg/dL (ref 1.7–2.4)

## 2021-01-30 MED ORDER — REMDESIVIR 100 MG IV SOLR
100.0000 mg | Freq: Every day | INTRAVENOUS | Status: DC
Start: 2021-01-31 — End: 2021-01-31
  Filled 2021-01-30: qty 20

## 2021-01-30 MED ORDER — ENOXAPARIN SODIUM 40 MG/0.4ML IJ SOSY
40.0000 mg | PREFILLED_SYRINGE | INTRAMUSCULAR | Status: DC
  Administered 2021-01-30 – 2021-02-03 (×5): 40 mg via SUBCUTANEOUS
  Filled 2021-01-30 (×5): qty 0.4

## 2021-01-30 MED ORDER — DEXAMETHASONE 4 MG PO TABS: 6.0000 mg | ORAL_TABLET | Freq: Every day | ORAL | Status: DC

## 2021-01-30 MED ORDER — SODIUM CHLORIDE 0.9 % IV SOLN
200.0000 mg | Freq: Once | INTRAVENOUS | Status: DC
  Filled 2021-01-30: qty 40

## 2021-01-30 MED ORDER — ACETAMINOPHEN 650 MG RE SUPP: 650.0000 mg | Freq: Four times a day (QID) | RECTAL | Status: DC | PRN

## 2021-01-30 MED ORDER — IOHEXOL 350 MG/ML SOLN
75.0000 mL | Freq: Once | INTRAVENOUS | Status: AC | PRN
  Administered 2021-01-30: 75 mL via INTRAVENOUS

## 2021-01-30 MED ORDER — GUAIFENESIN ER 600 MG PO TB12
600.0000 mg | ORAL_TABLET | Freq: Two times a day (BID) | ORAL | Status: DC
  Administered 2021-01-30 – 2021-02-03 (×10): 600 mg via ORAL
  Filled 2021-01-30 (×10): qty 1

## 2021-01-30 MED ORDER — LEVOTHYROXINE SODIUM 100 MCG PO TABS
100.0000 ug | ORAL_TABLET | Freq: Every day | ORAL | Status: DC
  Administered 2021-01-30 – 2021-02-03 (×5): 100 ug via ORAL
  Filled 2021-01-30 (×5): qty 1

## 2021-01-30 MED ORDER — METHOCARBAMOL 500 MG PO TABS
500.0000 mg | ORAL_TABLET | Freq: Three times a day (TID) | ORAL | Status: DC | PRN
  Administered 2021-01-30 – 2021-02-02 (×5): 500 mg via ORAL
  Filled 2021-01-30 (×5): qty 1

## 2021-01-30 MED ORDER — DIPHENHYDRAMINE HCL 25 MG PO CAPS
25.0000 mg | ORAL_CAPSULE | Freq: Once | ORAL | Status: AC | PRN
  Administered 2021-01-31: 25 mg via ORAL
  Filled 2021-01-30: qty 1

## 2021-01-30 MED ORDER — ALBUTEROL SULFATE (2.5 MG/3ML) 0.083% IN NEBU: 2.5000 mg | INHALATION_SOLUTION | RESPIRATORY_TRACT | Status: DC | PRN

## 2021-01-30 MED ORDER — NICOTINE 21 MG/24HR TD PT24
21.0000 mg | MEDICATED_PATCH | Freq: Every day | TRANSDERMAL | Status: DC
  Administered 2021-01-30 – 2021-02-03 (×5): 21 mg via TRANSDERMAL
  Filled 2021-01-30 (×5): qty 1

## 2021-01-30 MED ORDER — DEXAMETHASONE SODIUM PHOSPHATE 10 MG/ML IJ SOLN
10.0000 mg | Freq: Once | INTRAMUSCULAR | Status: AC
  Administered 2021-01-30: 10 mg via INTRAVENOUS
  Filled 2021-01-30: qty 1

## 2021-01-30 MED ORDER — ACETAMINOPHEN 325 MG PO TABS: 650.0000 mg | ORAL_TABLET | Freq: Four times a day (QID) | ORAL | Status: DC | PRN

## 2021-01-30 MED ORDER — ONDANSETRON HCL 4 MG/2ML IJ SOLN: 4.0000 mg | Freq: Four times a day (QID) | INTRAMUSCULAR | Status: DC | PRN

## 2021-01-30 MED ORDER — OXYCODONE HCL 5 MG PO TABS
5.0000 mg | ORAL_TABLET | Freq: Four times a day (QID) | ORAL | Status: DC | PRN
  Administered 2021-01-30 – 2021-02-03 (×6): 5 mg via ORAL
  Filled 2021-01-30 (×8): qty 1

## 2021-01-30 MED ORDER — ONDANSETRON HCL 4 MG PO TABS: 4.0000 mg | ORAL_TABLET | Freq: Four times a day (QID) | ORAL | Status: DC | PRN

## 2021-01-30 MED ORDER — ACETAMINOPHEN 500 MG PO TABS
1000.0000 mg | ORAL_TABLET | Freq: Three times a day (TID) | ORAL | Status: DC
  Administered 2021-01-30 – 2021-02-03 (×13): 1000 mg via ORAL
  Filled 2021-01-30 (×13): qty 2

## 2021-01-30 NOTE — ED Notes (Signed)
Pt oxygen saturation noted at 83% on room air. Oxygen saturation increased to 85% on 4L nasal cannula. Further increased to 92% on 6L nasal cannula. EDP made aware of supplemental oxygen requirements.

## 2021-01-30 NOTE — H&P (Signed)
History and Physical    Jessica Cooke YNW:[id number] DOB: 1960/05/24 DOA: 01/29/2021  PCP: Jackie Plum, MD   Patient coming from: Home  I have personally briefly reviewed patient's old medical records in [facility]  CC: leg pain HPI: 60 yo AAF with hx of HTN, hypothyroidism, hyperlipidemia, presents to ER with c/o left leg pain. Pt was seen in [facility] on 01-26-2021 for complaints of leg pain.  LE U/S negative for DVT on 01-26-2021.  Pt discharged to home. Pt came back to [facility] ER today due to continued c/o of leg pain. Workup in ER essentially negative. MRI back did not show any spinal stenosis.  Pt reportedly cough for several days but this is not recorded on ER visit on 01-26-2021 or today.  Pt states she had had cough for about 5-7 days when her left leg started to hurt. Also states her husband and dtr both have covid. She has had 2 vaccination for covid but not booster shot.  Pt dx'd with covid on 01-22-2021.  During workup in ER, pt noted to be hypoxic with RA sats 85%.  CXR showed RLL pneumonia. CTPA ordered but pending at time admission was called.  [facility] hospitalist consulted for admission.    ED Course: workup for leg pain negative. CTPA negative for PE or pneumonia. Pt noted to be hypoxic.  Review of Systems:  Review of Systems  Constitutional:  Positive for malaise/fatigue.  HENT: Negative.    Eyes: Negative.   Respiratory:  Positive for cough and shortness of breath.   Cardiovascular: Negative.   Gastrointestinal: Negative.   Genitourinary: Negative.   Musculoskeletal:        Left leg pain. Radicular in nature  Skin: Negative.   Neurological: Negative.   Endo/Heme/Allergies: Negative.   Psychiatric/Behavioral: Negative.    All other systems reviewed and are negative.  Past Medical History:  Diagnosis Date   Eczema    Hypothyroid    Thyroid disease     Past Surgical History:  Procedure Laterality Date   ABDOMINAL HYSTERECTOMY        reports that she has been smoking cigarettes. She has been smoking an average of 1 pack per day. She has never used smokeless tobacco. She reports current alcohol use. She reports that she does not use drugs.  Allergies  Allergen Reactions   Dilaudid [Hydromorphone Hcl] Nausea And Vomiting   Tramadol Nausea And Vomiting    Family History  Problem Relation Age of Onset   Hypertension Mother    Hypertension Sister    Hypertension Brother     Prior to Admission medications   Medication Sig Start Date End Date Taking? Authorizing Provider  calcium gluconate 500 MG tablet Take 1 tablet (500 mg total) by mouth 2 (two) times daily. 01/26/21   Benjiman Core, MD  levothyroxine (SYNTHROID) 100 MCG tablet TAKE 1 TABLET(100 MCG) BY MOUTH DAILY BEFORE BREAKFAST 08/14/20   Hoy Register, MD  methocarbamol (ROBAXIN) 500 MG tablet TAKE 2 TABLETS (1,000 MG TOTAL) BY MOUTH EVERY 8 (EIGHT) HOURS AS NEEDED FOR MUSCLE SPASMS. 07/23/20 07/23/21  Grayce Sessions, NP  nicotine (NICODERM CQ) 21 mg/24hr patch Place 1 patch (21 mg total) onto the skin daily. 07/23/20   Grayce Sessions, NP  oxyCODONE-acetaminophen (PERCOCET/ROXICET) 5-325 MG tablet Take 1-2 tablets by mouth every 8 (eight) hours as needed for severe pain. 01/26/21   Benjiman Core, MD    Physical Exam: Vitals:   01/29/21 1337 01/29/21 1500 01/29/21 1605 01/29/21 2057  BP: 135/79 138/78 (!) 142/76 124/76  Pulse: 68 70 75 90  Resp: 15 14 19 16   Temp:   98.8 F (37.1 C)   TempSrc:   Oral   SpO2: 95% 100% 96% (!) 85%  Weight:      Height:        Physical Exam Vitals and nursing note reviewed.  Constitutional:      General: She is not in acute distress.    Appearance: She is obese. She is not ill-appearing, toxic-appearing or diaphoretic.  HENT:     Head: Normocephalic and atraumatic.     Nose: Nose normal. No rhinorrhea.  Cardiovascular:     Rate and Rhythm: Normal rate and regular rhythm.     Pulses: Normal pulses.   Pulmonary:     Effort: Pulmonary effort is normal.     Breath sounds: Decreased breath sounds present.  Abdominal:     General: Bowel sounds are normal. There is no distension.     Tenderness: There is no abdominal tenderness. There is no guarding.  Musculoskeletal:     Right lower leg: No edema.     Left lower leg: No edema.  Skin:    General: Skin is warm and dry.     Capillary Refill: Capillary refill takes less than 2 seconds.  Neurological:     General: No focal deficit present.     Mental Status: She is alert and oriented to person, place, and time.     Labs on Admission: I have personally reviewed following labs and imaging studies  CBC: Recent Labs  Lab 01/29/21 1440  WBC 8.0  HGB 17.0*  HCT 49.4*  MCV 92.0  PLT 507*   Basic Metabolic Panel: Recent Labs  Lab 01/26/21 2125 01/29/21 1440  NA 139 135  K 3.7 5.7*  CL 109 103  CO2 19* 19*  GLUCOSE 95 91  BUN 6* 9  CREATININE 0.70 0.82  CALCIUM 8.4* 9.0   GFR: Estimated Creatinine Clearance: 89.1 mL/min (by C-G formula based on SCr of 0.82 mg/dL). Liver Function Tests: No results for input(s): AST, ALT, ALKPHOS, BILITOT, PROT, ALBUMIN in the last 168 hours. No results for input(s): LIPASE, AMYLASE in the last 168 hours. No results for input(s): AMMONIA in the last 168 hours. Coagulation Profile: No results for input(s): INR, PROTIME in the last 168 hours. Cardiac Enzymes: Recent Labs  Lab 01/29/21 1440  CKTOTAL 136   BNP (last 3 results) No results for input(s): PROBNP in the last 8760 hours. HbA1C: No results for input(s): HGBA1C in the last 72 hours. CBG: No results for input(s): GLUCAP in the last 168 hours. Lipid Profile: No results for input(s): CHOL, HDL, LDLCALC, TRIG, CHOLHDL, LDLDIRECT in the last 72 hours. Thyroid Function Tests: No results for input(s): TSH, T4TOTAL, FREET4, T3FREE, THYROIDAB in the last 72 hours. Anemia Panel: No results for input(s): VITAMINB12, FOLATE, FERRITIN,  TIBC, IRON, RETICCTPCT in the last 72 hours. Urine analysis:    Component Value Date/Time   COLORURINE YELLOW 09/09/2016 2214   APPEARANCEUR CLEAR 09/09/2016 2214   LABSPEC 1.008 09/09/2016 2214   PHURINE 5.0 09/09/2016 2214   GLUCOSEU NEGATIVE 09/09/2016 2214   HGBUR NEGATIVE 09/09/2016 2214   BILIRUBINUR NEGATIVE 09/09/2016 2214   KETONESUR NEGATIVE 09/09/2016 2214   PROTEINUR NEGATIVE 09/09/2016 2214   UROBILINOGEN 1.0 10/05/2013 2030   NITRITE NEGATIVE 09/09/2016 2214   LEUKOCYTESUR NEGATIVE 09/09/2016 2214    Radiological Exams on Admission: I have personally reviewed images DG Chest  2 View  Result Date: 01/29/2021 CLINICAL DATA:  Hypoxia EXAM: CHEST - 2 VIEW COMPARISON:  01/22/2021 FINDINGS: Cardiac shadow is stable. Lungs are well aerated bilaterally. Small posterior effusions are noted. Additionally there is focal infiltrate in what appears to be the right lower lobe posteriorly. No bony abnormality is seen. IMPRESSION: Right lower lobe infiltrate and small associated pleural effusions bilaterally. Electronically Signed   By: Alcide Clever M.D.   On: 01/29/2021 22:02   DG Tibia/Fibula Left  Result Date: 01/29/2021 CLINICAL DATA:  Lower left leg pain. EXAM: LEFT TIBIA AND FIBULA - 2 VIEW COMPARISON:  March 31, 2018 FINDINGS: There is no evidence of fracture or other focal bone lesions. Soft tissues are unremarkable. IMPRESSION: Negative. Electronically Signed   By: Aram Candela M.D.   On: 01/29/2021 20:46   CT Angio Chest PE W and/or Wo Contrast  Result Date: 01/30/2021 CLINICAL DATA:  Hypoxia EXAM: CT ANGIOGRAPHY CHEST WITH CONTRAST TECHNIQUE: Multidetector CT imaging of the chest was performed using the standard protocol during bolus administration of intravenous contrast. Multiplanar CT image reconstructions and MIPs were obtained to evaluate the vascular anatomy. CONTRAST:  15mL OMNIPAQUE IOHEXOL 350 MG/ML SOLN COMPARISON:  Chest x-ray from the previous day.  FINDINGS: Cardiovascular: Thoracic aorta and its branches are well visualized without evidence of aneurysmal dilatation or dissection. Mild atherosclerotic calcifications are seen. Coronary calcifications are noted. No significant cardiac enlargement is seen. The pulmonary artery shows a normal branching pattern bilaterally. No filling defect to suggest pulmonary embolism is noted. Mediastinum/Nodes: Thoracic inlet is within normal limits. No sizable hilar or mediastinal adenopathy is noted. The esophagus as visualized is within normal limits. Lungs/Pleura: Lungs demonstrate emphysematous change. Bilateral lower lobe atelectatic changes are noted with minimal pleural effusions similar to that seen on prior plain film examination. No sizable parenchymal nodule is noted. No focal confluent infiltrate is seen. Upper Abdomen: Visualized upper abdomen shows no acute abnormality. Musculoskeletal: Degenerative changes of the thoracic spine are seen. No compression deformity is noted. No definitive rib fracture abnormality is noted. Review of the MIP images confirms the above findings. IMPRESSION: No evidence of pulmonary emboli. Bibasilar atelectatic changes with small effusions. Aortic Atherosclerosis (ICD10-I70.0) and Emphysema (ICD10-J43.9). Electronically Signed   By: Alcide Clever M.D.   On: 01/30/2021 00:13   MR LUMBAR SPINE WO CONTRAST  Result Date: 01/29/2021 CLINICAL DATA:  low back pain, progressive neurologic deficit EXAM: MRI LUMBAR SPINE WITHOUT CONTRAST TECHNIQUE: Multiplanar, multisequence MR imaging of the lumbar spine was performed. No intravenous contrast was administered. COMPARISON:  X-ray 11/06/2019, MRI 12/19/2019 FINDINGS: Segmentation:  Standard. Alignment:  Physiologic. Vertebrae: No acute fracture. No evidence of discitis. Diffuse marrow heterogeneity without discrete marrow replacing lesion. Findings are nonspecific but can be seen in the setting of chronic anemia, smoking, and/or obesity.  Conus medullaris and cauda equina: Conus extends to the L2 level. Conus and cauda equina appear normal. 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 This appears increased in size compared to the previous MRI. Disc levels: T12-L1: Unremarkable. L1-L2: Unremarkable. L2-L3: Unremarkable. L3-L4: Mild disc desiccation and minimal annular disc bulge. Mild bilateral facet arthropathy. No foraminal or canal stenosis. Unchanged. L4-L5: Disc desiccation and mild annular disc bulge. Moderate bilateral facet arthropathy with small bilateral facet joint effusions. No foraminal or canal  stenosis. Unchanged. L5-S1: Disc height loss with diffuse disc bulge. Mild bilateral facet arthropathy. Mild-to-moderate left and mild right foraminal stenosis. No canal stenosis. Findings may be minimally progressed compared to the previous study. IMPRESSION: 1. Mild degenerative changes of the lumbar spine greatest at L5-S1 where there is mild-to-moderate left and mild right foraminal stenosis. 2. No canal stenosis at any level. 3. 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 This appears increased in size compared to the previous MRI. Findings are most likely incidental and of doubtful clinical significance. If clinically desired, a follow-up MRI of the abdomen with contrast could be obtained in 1 year to assess stability. Left 4. Diffuse marrow heterogeneity without discrete marrow replacing lesion. Findings are nonspecific but can be seen in the setting of chronic anemia, smoking, and/or obesity. Electronically Signed   By: Duanne Guess D.O.   On:  01/29/2021 18:17    EKG: I have personally reviewed EKG: no EKG performed  Assessment/Plan Principal Problem:   COVID-19 virus infection Active Problems:   Acute respiratory failure with hypoxia (HCC)   Hypothyroidism   Essential hypertension   Mixed hyperlipidemia   Tobacco abuse   COVID-19 virus infection Admit to medical bed. Start decadron and remdesivir. CTPA negative for pneumonia.  Acute respiratory failure with hypoxia (HCC) Continue with supplemental O2.  Hypothyroidism Stable. Continue synthroid.  Essential hypertension Stable.  Mixed hyperlipidemia Stable.  Tobacco abuse Nicotine patch ordered.  DVT prophylaxis: Lovenox Code Status: Full Code Family Communication: no family at bedside  Disposition Plan: return hom  Consults called: none  Admission status: Observation, Med-Surg   Carollee Herter, DO [facility] 01/30/2021, 12:25 AM

## 2021-01-30 NOTE — ED Notes (Signed)
Breakfast Order Placed ?

## 2021-01-30 NOTE — Assessment & Plan Note (Addendum)
Admit to medical bed. Start decadron and remdesivir. CTPA negative for pneumonia.

## 2021-01-30 NOTE — Progress Notes (Signed)
PROGRESS NOTE        PATIENT DETAILS Name: Jessica Cooke Age: 60 y.o. Sex: female Date of Birth: 10-Jul-1960 Admit Date: 01/29/2021 Admitting Physician Carollee Herter, DO DUK:[id number], Greggory Stallion, MD  Brief Narrative: Patient is a 60 y.o. female with history of HTN, HLD, hypothyroidism-diagnosed with COVID-19 on 10/7 (numerous family members positive as well)-presenting to the ED on 10/14 with severe left lower extremity pain (similar ED visit on 10/11-Doppler negative for DVT)  Subjective: Continues to have pain in her left lower extremities-Per patient-pain is mostly when she moves-at rest pain appears stable.  Objective: Vitals: Blood pressure 130/85, pulse 99, temperature 98 F (36.7 C), temperature source Oral, resp. rate 18, height 5\' 9"  (1.753 m), weight 98.9 kg, SpO2 91 %.   Exam: Gen Exam:Alert awake-not in any distress HEENT:atraumatic, normocephalic Chest: B/L clear to auscultation anteriorly CVS:S1S2 regular Abdomen:soft non tender, non distended Extremities:no edema Neurology: Non focal Skin: no rash  Pertinent Labs/Radiology: WBC: 8.0 Hb: 16.6 Na: 135 K: 3.8 Creatinine: 0.74 AST/ALT: 13/15 CK: 98 D-dimer: 3.05  10/11>> left lower extremity Doppler: No DVT 10/14>> MRI lumbar spine: Mild degenerative changes but no acute findings. 10/15>> CTA chest: No PE, bibasilar atelectatic changes.  10/7>> COVID PCR: Positive with CT value 22.7 10/15>> COVID PCR: Negative   Assessment/Plan: COVID-19 infection: Continues to have cough/myalgias-no pneumonia evident on imaging-unclear whether she is actually hypoxemic-as O2 saturations fluctuate quite a bit.  Stop Decadron-continue Remdesivir-we will plan for 3 days of therapy.  Note-she was diagnosed on 10/7-family members were also positive-she had URI's/cough/myalgias-CT value on 10/7 was 22.7.  She interestingly tested negative on 10/15-not sure if this is a reliable sample.  Given the fact  that she still has symptoms-we will place on isolation for now.  We will plan to repeat another COVID PCR tomorrow.  Left lower extremity pain: Unclear etiology-CK levels are normal-MRI of lumbar spine without any evidence of significant spinal stenosis that would cause neurogenic claudication.  Exam is without any swelling/redness-leg appears warm.  Given that her D-dimer is significantly elevated-and that she has COVID-19 infection-reasonable to repeat another Doppler study to rule out DVT.  If Doppler is negative for DVT and she continues to have pain-May need to pursue ABI/arterial studies.  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  Tobacco abuse: Continue transdermal nicotine  Obesity: Estimated body mass index is 32.2 kg/m as calculated from the following:   Height as of this encounter: 5\' 9"  (1.753 m).   Weight as of this encounter: 98.9 kg.   Procedures: None Consults: None DVT Prophylaxis: Lovenox Code Status:Full code Family Communication: None at bedside  Time spent: 35 minutes-Greater than 50% of this time was spent in counseling, explanation of diagnosis, planning of further management, and coordination of care.  Diet: Diet Order             Diet regular Room service appropriate? Yes; Fluid consistency: Thin  Diet effective now                      Disposition Plan: Status is: Observation  The patient remains OBS appropriate and will d/c before 2 midnights.   Barriers to Discharge: COVID infection with severe left lower extremity pain-work-up in progress.  Antimicrobial agents: Anti-infectives (From admission, onward)    Start     Dose/Rate Route Frequency Ordered Stop   01/31/21 1000  remdesivir 100 mg in sodium chloride 0.9 % 100 mL IVPB       See Hyperspace for full Linked Orders Report.   100  mg 200 mL/hr over 30 Minutes Intravenous Daily 01/30/21 0018 02/04/21 0959   01/30/21 0200  remdesivir 200 mg in sodium chloride 0.9% 250 mL IVPB       See Hyperspace for full Linked Orders Report.   200 mg 580 mL/hr over 30 Minutes Intravenous Once 01/30/21 0018     01/29/21 2300  cefTRIAXone (ROCEPHIN) 1 g in sodium chloride 0.9 % 100 mL IVPB        1 g 200 mL/hr over 30 Minutes Intravenous  Once 01/29/21 2258 01/30/21 0008   01/29/21 2300  azithromycin (ZITHROMAX) 500 mg in sodium chloride 0.9 % 250 mL IVPB        500 mg 250 mL/hr over 60 Minutes Intravenous  Once 01/29/21 2258 01/30/21 0139        MEDICATIONS: Scheduled Meds:  acetaminophen  1,000 mg Oral Q8H   enoxaparin (LOVENOX) injection  40 mg Subcutaneous Q24H   guaiFENesin  600 mg Oral BID   levothyroxine  100 mcg Oral Q0600   nicotine  21 mg Transdermal Daily   Continuous Infusions:  remdesivir 200 mg in sodium chloride 0.9% 250 mL IVPB     Followed by   Melene Muller ON 01/31/2021] remdesivir 100 mg in NS 100 mL     PRN Meds:.albuterol, methocarbamol, ondansetron **OR** ondansetron (ZOFRAN) IV, oxyCODONE   I have personally reviewed following labs and imaging studies  LABORATORY DATA: CBC: Recent Labs  Lab 01/29/21 1440 01/30/21 0353  WBC 8.0 8.0  NEUTROABS  --  6.6  HGB 17.0* 16.6*  HCT 49.4* 47.9*  MCV 92.0 91.4  PLT 507* 499*    Basic Metabolic Panel: Recent Labs  Lab 01/26/21 2125 01/29/21 1440 01/30/21 0353  NA 139 135 135  K 3.7 5.7* 3.8  CL 109 103 102  CO2 19* 19* 23  GLUCOSE 95 91 111*  BUN 6* 9 8  CREATININE 0.70 0.82 0.74  CALCIUM 8.4* 9.0 9.0  MG  --   --  2.3    GFR: Estimated Creatinine Clearance: 91.3 mL/min (by C-G formula based on SCr of 0.74 mg/dL).  Liver Function Tests: Recent Labs  Lab 01/30/21 0353  AST 13*  ALT 15  ALKPHOS 44  BILITOT 0.7  PROT 6.5  ALBUMIN 3.7   No results for input(s): LIPASE, AMYLASE in the last 168 hours. No results for input(s): AMMONIA  in the last 168 hours.  Coagulation Profile: No results for input(s): INR, PROTIME in the last 168 hours.  Cardiac Enzymes: Recent Labs  Lab 01/29/21 1440 01/30/21 0846  CKTOTAL 136 98    BNP (last 3 results) No results for input(s): PROBNP in the last 8760 hours.  Lipid Profile: No results for input(s): CHOL, HDL, LDLCALC, TRIG, CHOLHDL, LDLDIRECT in the last 72 hours.  Thyroid Function Tests: No results for input(s): TSH, T4TOTAL, FREET4, T3FREE, THYROIDAB in the last 72 hours.  Anemia Panel: Recent Labs    01/30/21 0353  FERRITIN 139    Urine analysis:    Component Value Date/Time   COLORURINE YELLOW 09/09/2016 2214   APPEARANCEUR CLEAR 09/09/2016 2214   LABSPEC 1.008 09/09/2016 2214   PHURINE 5.0 09/09/2016 2214   GLUCOSEU NEGATIVE 09/09/2016 2214  HGBUR NEGATIVE 09/09/2016 2214   BILIRUBINUR NEGATIVE 09/09/2016 2214   KETONESUR NEGATIVE 09/09/2016 2214   PROTEINUR NEGATIVE 09/09/2016 2214   UROBILINOGEN 1.0 10/05/2013 2030   NITRITE NEGATIVE 09/09/2016 2214   LEUKOCYTESUR NEGATIVE 09/09/2016 2214    Sepsis Labs: Lactic Acid, Venous    Component Value Date/Time   LATICACIDVEN 1.16 09/12/2017 1925    MICROBIOLOGY: Recent Results (from the past 240 hour(s))  Resp Panel by RT-PCR (Flu A&B, Covid) Nasopharyngeal Swab     Status: Abnormal   Collection Time: 01/22/21 10:13 AM   Specimen: Nasopharyngeal Swab; Nasopharyngeal(NP) swabs in vial transport medium  Result Value Ref Range Status   SARS Coronavirus 2 by RT PCR POSITIVE (A) NEGATIVE Final    Comment: RESULT CALLED TO, READ BACK BY AND VERIFIED WITH: KERRY D. RN ON 01/22/2021 @ 1629 BY MECIAL J. (NOTE) SARS-CoV-2 target nucleic acids are DETECTED.  The SARS-CoV-2 RNA is generally detectable in upper respiratory specimens during the acute phase of infection. Positive results are indicative of the presence of the identified virus, but do not rule out bacterial infection or co-infection with other  pathogens not detected by the test. Clinical correlation with patient history and other diagnostic information is necessary to determine patient infection status. The expected result is Negative.  Fact Sheet for Patients: BloggerCourse.com  Fact Sheet for Healthcare Providers: SeriousBroker.it  This test is not yet approved or cleared by the [place] FDA and  has been authorized for detection and/or diagnosis of SARS-CoV-2 by FDA under an Emergency Use Authorization (EUA).  This EUA will remain in effect (meaning this  test can be used) for the duration of  the COVID-19 declaration under Section 564(b)(1) of the Act, 21 U.S.C. section 360bbb-3(b)(1), unless the authorization is terminated or revoked sooner.     Influenza A by PCR NEGATIVE NEGATIVE Final   Influenza B by PCR NEGATIVE NEGATIVE Final    Comment: (NOTE) The Xpert Xpress SARS-CoV-2/FLU/RSV plus assay is intended as an aid in the diagnosis of influenza from Nasopharyngeal swab specimens and should not be used as a sole basis for treatment. Nasal washings and aspirates are unacceptable for Xpert Xpress SARS-CoV-2/FLU/RSV testing.  Fact Sheet for Patients: BloggerCourse.com  Fact Sheet for Healthcare Providers: SeriousBroker.it  This test is not yet approved or cleared by the [place] FDA and has been authorized for detection and/or diagnosis of SARS-CoV-2 by FDA under an Emergency Use Authorization (EUA). This EUA will remain in effect (meaning this test can be used) for the duration of the COVID-19 declaration under Section 564(b)(1) of the Act, 21 U.S.C. section 360bbb-3(b)(1), unless the authorization is terminated or revoked.  Performed at [facility], [address], [place], [place] [address]   Resp Panel by RT-PCR (Flu A&B, Covid) Nasopharyngeal Swab     Status: None    Collection Time: 01/30/21 12:36 AM   Specimen: Nasopharyngeal Swab; Nasopharyngeal(NP) swabs in vial transport medium  Result Value Ref Range Status   SARS Coronavirus 2 by RT PCR NEGATIVE NEGATIVE Final    Comment: (NOTE) SARS-CoV-2 target nucleic acids are NOT DETECTED.  The SARS-CoV-2 RNA is generally detectable in upper respiratory specimens during the acute phase of infection. The lowest concentration of SARS-CoV-2 viral copies this assay can detect is 138 copies/mL. A negative result does not preclude SARS-Cov-2 infection and should not be used as the sole basis for treatment or other patient management decisions. A negative result may occur with  improper specimen collection/handling, submission of  specimen other than nasopharyngeal swab, presence of viral mutation(s) within the areas targeted by this assay, and inadequate number of viral copies(<138 copies/mL). A negative result must be combined with clinical observations, patient history, and epidemiological information. The expected result is Negative.  Fact Sheet for Patients:  BloggerCourse.com  Fact Sheet for Healthcare Providers:  SeriousBroker.it  This test is no t yet approved or cleared by the [place] FDA and  has been authorized for detection and/or diagnosis of SARS-CoV-2 by FDA under an Emergency Use Authorization (EUA). This EUA will remain  in effect (meaning this test can be used) for the duration of the COVID-19 declaration under Section 564(b)(1) of the Act, 21 U.S.C.section 360bbb-3(b)(1), unless the authorization is terminated  or revoked sooner.       Influenza A by PCR NEGATIVE NEGATIVE Final   Influenza B by PCR NEGATIVE NEGATIVE Final    Comment: (NOTE) The Xpert Xpress SARS-CoV-2/FLU/RSV plus assay is intended as an aid in the diagnosis of influenza from Nasopharyngeal swab specimens and should not be used as a sole basis for treatment.  Nasal washings and aspirates are unacceptable for Xpert Xpress SARS-CoV-2/FLU/RSV testing.  Fact Sheet for Patients: BloggerCourse.com  Fact Sheet for Healthcare Providers: SeriousBroker.it  This test is not yet approved or cleared by the [place] FDA and has been authorized for detection and/or diagnosis of SARS-CoV-2 by FDA under an Emergency Use Authorization (EUA). This EUA will remain in effect (meaning this test can be used) for the duration of the COVID-19 declaration under Section 564(b)(1) of the Act, 21 U.S.C. section 360bbb-3(b)(1), unless the authorization is terminated or revoked.  Performed at [facility], [address], [place], [place] [address]     RADIOLOGY STUDIES/RESULTS: DG Chest 2 View  Result Date: 01/29/2021 CLINICAL DATA:  Hypoxia EXAM: CHEST - 2 VIEW COMPARISON:  01/22/2021 FINDINGS: Cardiac shadow is stable. Lungs are well aerated bilaterally. Small posterior effusions are noted. Additionally there is focal infiltrate in what appears to be the right lower lobe posteriorly. No bony abnormality is seen. IMPRESSION: Right lower lobe infiltrate and small associated pleural effusions bilaterally. Electronically Signed   By: Alcide Clever M.D.   On: 01/29/2021 22:02   DG Tibia/Fibula Left  Result Date: 01/29/2021 CLINICAL DATA:  Lower left leg pain. EXAM: LEFT TIBIA AND FIBULA - 2 VIEW COMPARISON:  March 31, 2018 FINDINGS: There is no evidence of fracture or other focal bone lesions. Soft tissues are unremarkable. IMPRESSION: Negative. Electronically Signed   By: Aram Candela M.D.   On: 01/29/2021 20:46   CT Angio Chest PE W and/or Wo Contrast  Result Date: 01/30/2021 CLINICAL DATA:  Hypoxia EXAM: CT ANGIOGRAPHY CHEST WITH CONTRAST TECHNIQUE: Multidetector CT imaging of the chest was performed using the standard protocol during bolus administration of intravenous contrast. Multiplanar CT  image reconstructions and MIPs were obtained to evaluate the vascular anatomy. CONTRAST:  42mL OMNIPAQUE IOHEXOL 350 MG/ML SOLN COMPARISON:  Chest x-ray from the previous day. FINDINGS: Cardiovascular: Thoracic aorta and its branches are well visualized without evidence of aneurysmal dilatation or dissection. Mild atherosclerotic calcifications are seen. Coronary calcifications are noted. No significant cardiac enlargement is seen. The pulmonary artery shows a normal branching pattern bilaterally. No filling defect to suggest pulmonary embolism is noted. Mediastinum/Nodes: Thoracic inlet is within normal limits. No sizable hilar or mediastinal adenopathy is noted. The esophagus as visualized is within normal limits. Lungs/Pleura: Lungs demonstrate emphysematous change. Bilateral lower lobe atelectatic changes are noted with  minimal pleural effusions similar to that seen on prior plain film examination. No sizable parenchymal nodule is noted. No focal confluent infiltrate is seen. Upper Abdomen: Visualized upper abdomen shows no acute abnormality. Musculoskeletal: Degenerative changes of the thoracic spine are seen. No compression deformity is noted. No definitive rib fracture abnormality is noted. Review of the MIP images confirms the above findings. IMPRESSION: No evidence of pulmonary emboli. Bibasilar atelectatic changes with small effusions. Aortic Atherosclerosis (ICD10-I70.0) and Emphysema (ICD10-J43.9). Electronically Signed   By: Alcide Clever M.D.   On: 01/30/2021 00:13   MR LUMBAR SPINE WO CONTRAST  Result Date: 01/29/2021 CLINICAL DATA:  low back pain, progressive neurologic deficit EXAM: MRI LUMBAR SPINE WITHOUT CONTRAST TECHNIQUE: Multiplanar, multisequence MR imaging of the lumbar spine was performed. No intravenous contrast was administered. COMPARISON:  X-ray 11/06/2019, MRI 12/19/2019 FINDINGS: Segmentation:  Standard. Alignment:  Physiologic. Vertebrae: No acute fracture. No evidence of  discitis. Diffuse marrow heterogeneity without discrete marrow replacing lesion. Findings are nonspecific but can be seen in the setting of chronic anemia, smoking, and/or obesity. Conus medullaris and cauda equina: Conus extends to the L2 level. Conus and cauda equina appear normal. 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 This appears increased in size compared to the previous MRI. Disc levels: T12-L1: Unremarkable. L1-L2: Unremarkable. L2-L3: Unremarkable. L3-L4: Mild disc desiccation and minimal annular disc bulge. Mild bilateral facet arthropathy. No foraminal or canal stenosis. Unchanged. L4-L5: Disc desiccation and mild annular disc bulge. Moderate bilateral facet arthropathy with small bilateral facet joint effusions. No foraminal or canal stenosis. Unchanged. L5-S1: Disc height loss with diffuse disc bulge. Mild bilateral facet arthropathy. Mild-to-moderate left and mild right foraminal stenosis. No canal stenosis. Findings may be minimally progressed compared to the previous study. IMPRESSION: 1. Mild degenerative changes of the lumbar spine greatest at L5-S1 where there is mild-to-moderate left and mild right foraminal stenosis. 2. No canal stenosis at any level. 3. 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 This appears increased in size compared to the previous MRI. Findings are most likely incidental and of doubtful clinical significance. If clinically desired, a follow-up MRI of the abdomen with contrast could be obtained in 1 year to assess stability. Left 4. Diffuse marrow heterogeneity without discrete  marrow replacing lesion. Findings are nonspecific but can be seen in the setting of chronic anemia, smoking, and/or obesity. Electronically Signed   By: Duanne Guess D.O.   On: 01/29/2021 18:17     LOS: 0 days   Jeoffrey Massed, MD  [facility]    To contact the attending provider between 7A-7P or the covering provider during after hours 7P-7A, please log into the web site [email or web address] and access using universal [place] password for that web site. If you do not have the password, please call the hospital operator.  01/30/2021, 1:34 PM

## 2021-01-31 ENCOUNTER — Inpatient Hospital Stay (HOSPITAL_COMMUNITY): Payer: Self-pay

## 2021-01-31 DIAGNOSIS — M79662 Pain in left lower leg: Secondary | ICD-10-CM

## 2021-01-31 DIAGNOSIS — U071 COVID-19: Secondary | ICD-10-CM

## 2021-01-31 LAB — COMPREHENSIVE METABOLIC PANEL
ALT: 14 U/L (ref 0–44)
AST: 15 U/L (ref 15–41)
Albumin: 3.7 g/dL (ref 3.5–5.0)
Alkaline Phosphatase: 37 U/L — ABNORMAL LOW (ref 38–126)
Anion gap: 10 (ref 5–15)
BUN: 13 mg/dL (ref 8–23)
CO2: 23 mmol/L (ref 22–32)
Calcium: 9.5 mg/dL (ref 8.9–10.3)
Chloride: 104 mmol/L (ref 98–111)
Creatinine, Ser: 0.78 mg/dL (ref 0.44–1.00)
GFR, Estimated: >60 mL/min (ref >60)
Glucose, Bld: 111 mg/dL — ABNORMAL HIGH (ref 70–99)
Potassium: 3.3 mmol/L — ABNORMAL LOW (ref 3.5–5.1)
Sodium: 137 mmol/L (ref 135–145)
Total Bilirubin: 0.6 mg/dL (ref 0.3–1.2)
Total Protein: 6 g/dL — ABNORMAL LOW (ref 6.5–8.1)

## 2021-01-31 LAB — CK: Total CK: 62 U/L (ref 38–234)

## 2021-01-31 LAB — CBC
HCT: 43.9 % (ref 36.0–46.0)
Hemoglobin: 15.4 g/dL — ABNORMAL HIGH (ref 12.0–15.0)
MCH: 31.8 pg (ref 26.0–34.0)
MCHC: 35.1 g/dL (ref 30.0–36.0)
MCV: 90.5 fL (ref 80.0–100.0)
Platelets: 547 10*3/uL — ABNORMAL HIGH (ref 150–400)
RBC: 4.85 MIL/uL (ref 3.87–5.11)
RDW: 13.5 % (ref 11.5–15.5)
WBC: 9.7 10*3/uL (ref 4.0–10.5)
nRBC: 0 % (ref 0.0–0.2)

## 2021-01-31 LAB — D-DIMER, QUANTITATIVE: D-Dimer, Quant: 2.94 ug/mL-FEU — ABNORMAL HIGH (ref 0.00–0.50)

## 2021-01-31 LAB — RESP PANEL BY RT-PCR (FLU A&B, COVID) ARPGX2
Influenza A by PCR: NEGATIVE
Influenza B by PCR: NEGATIVE
SARS Coronavirus 2 by RT PCR: NEGATIVE

## 2021-01-31 LAB — URIC ACID: Uric Acid, Serum: 4.8 mg/dL (ref 2.5–7.1)

## 2021-01-31 IMAGING — MR MR [PERSON_NAME] LOW WO/W CM*L*
4 of 10 series · 10 of 40 positions shown · IV contrast (gadavist)
Comparison: X-ray [DATE]

CLINICAL DATA: Lower leg pain, stress fracture suspected, neg xray
Recent COVID with unexplained left leg pain-Doppler x2 negative,
x-ray negative for any bony abnormalities.

EXAM:
MRI OF LOWER LEFT EXTREMITY WITHOUT AND WITH CONTRAST
TECHNIQUE: Multiplanar, multisequence MR imaging of the left tibia and fibula
was performed both before and after administration of intravenous
contrast.
CONTRAST:  9.2mL GADAVIST GADOBUTROL 1 MMOL/ML IV SOLN

[Series 8: T1 · coronal · 4.0mm · 0.45mm/px · 3 of 32 slices shown (1 of 2)]
[im 1/32]
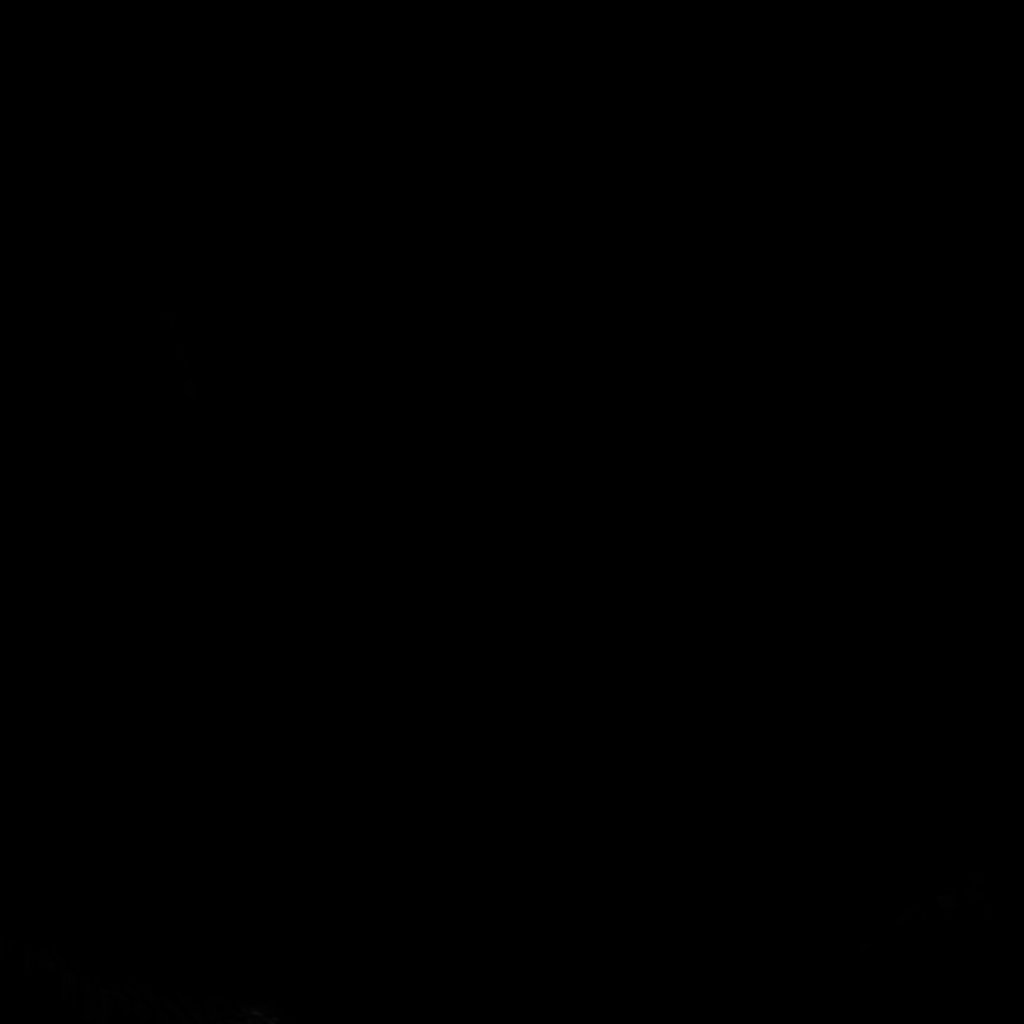
[im 16/32]
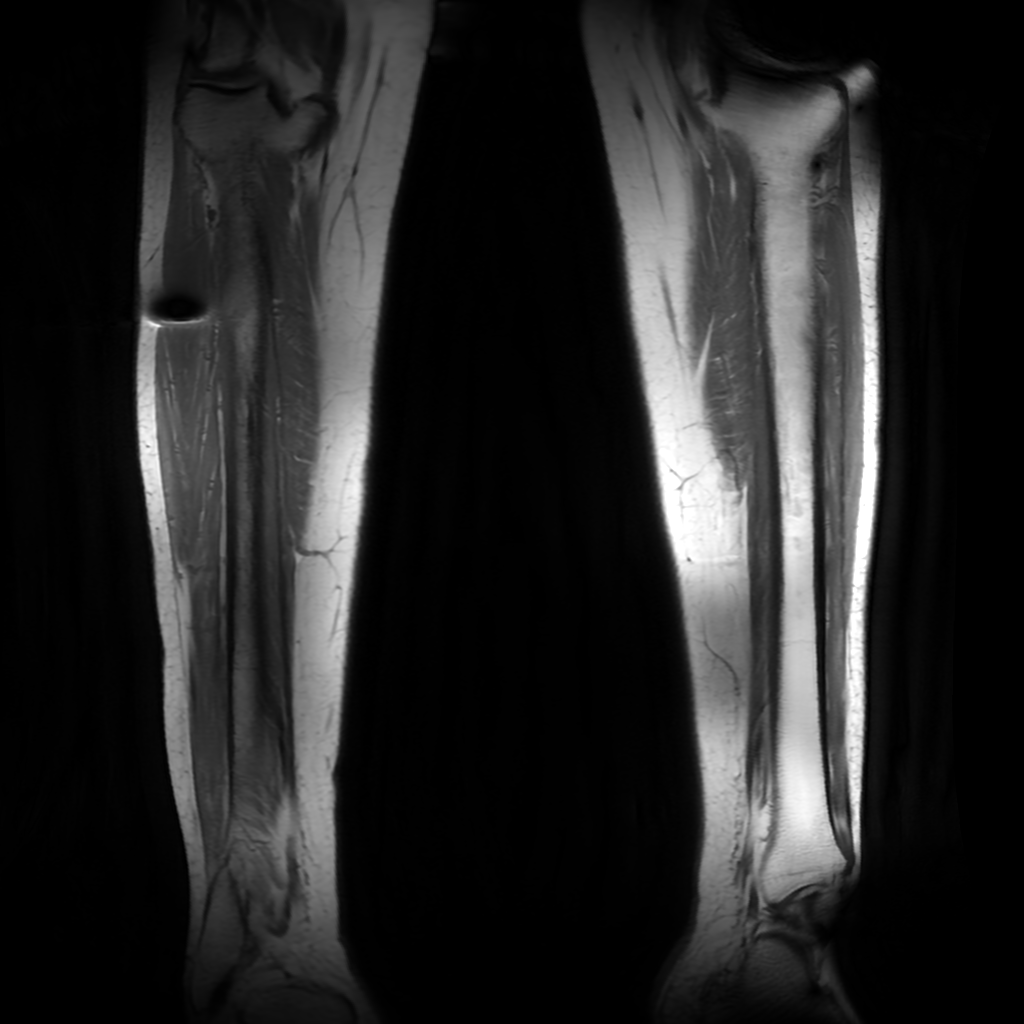
[im 32/32]
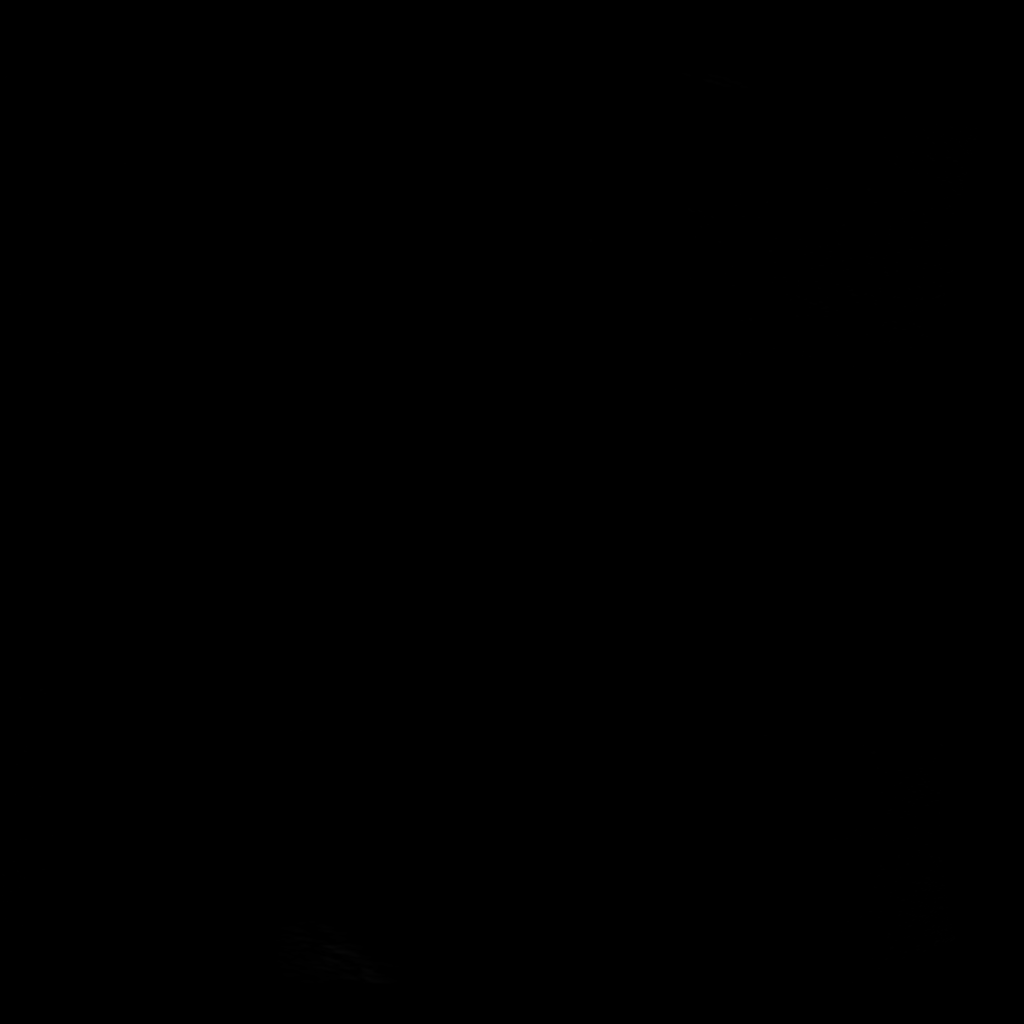

[Series 9: T1 · axial · 4.0mm · 0.51mm/px · z∈[-278,+1]mm · 3 of 84 slices shown (2 of 2)]
[im 14/84]
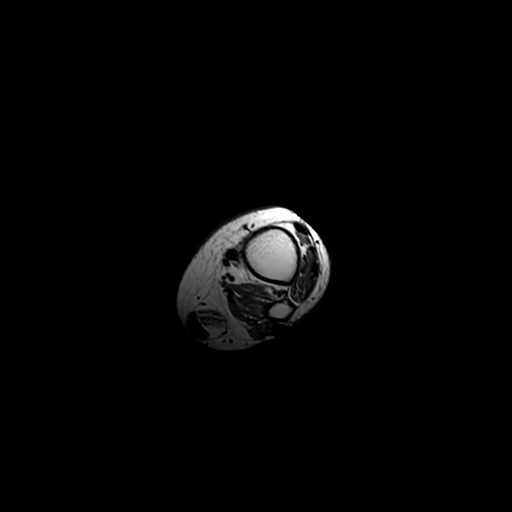
[im 42/84]
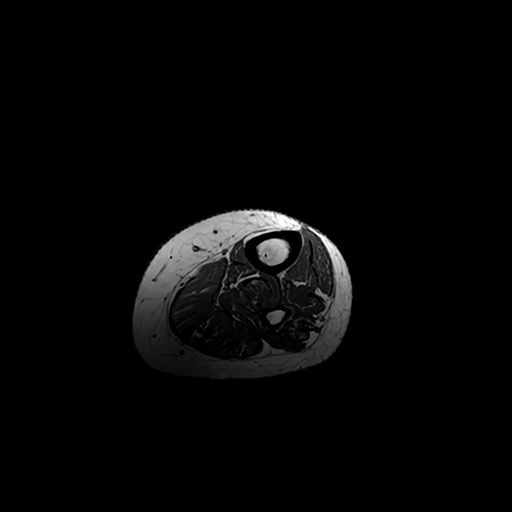
[im 70/84]
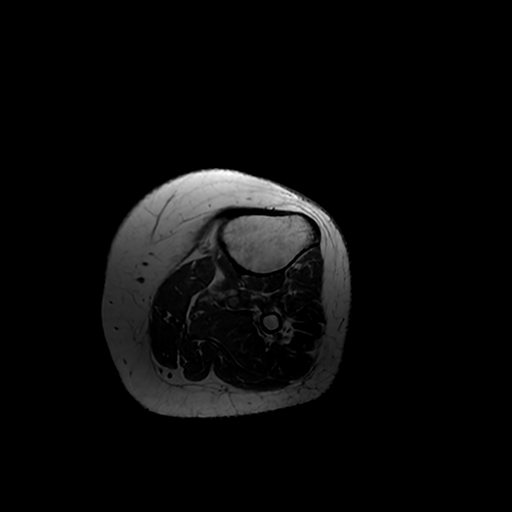

[Series 10: T2 fat-sat · axial · 4.0mm · 0.51mm/px · z∈[-263,+70]mm · 3 of 84 slices shown]
[im 17/84]
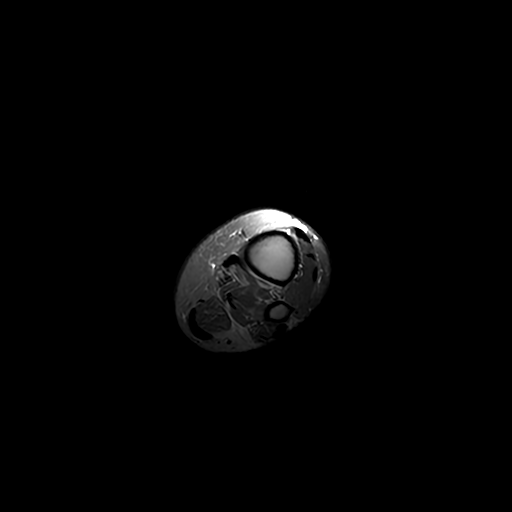
[im 50/84]
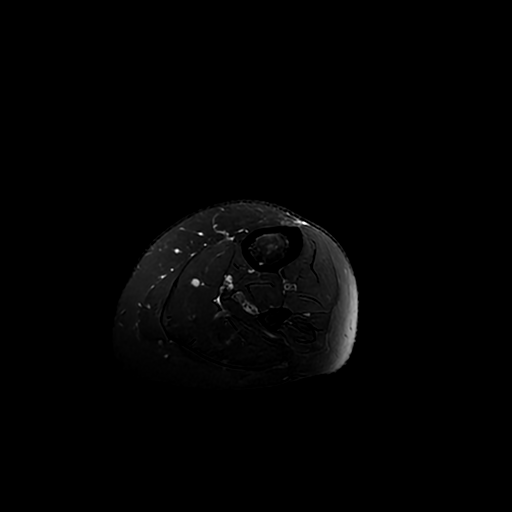
[im 84/84]
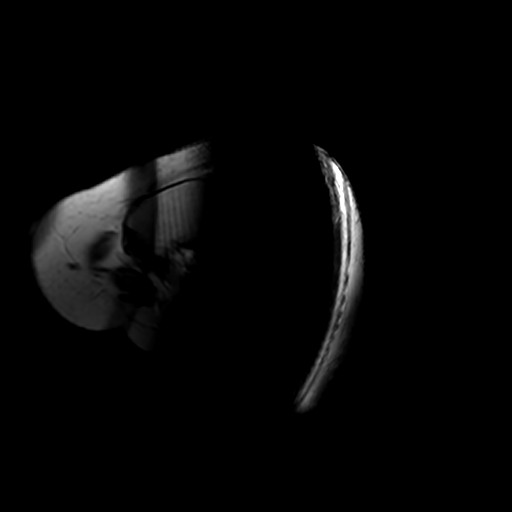

[Series 12: T1 fat-sat · axial · non-contrast · 4.0mm · 0.51mm/px · 1 of 84 slices shown]
[im 17/84]
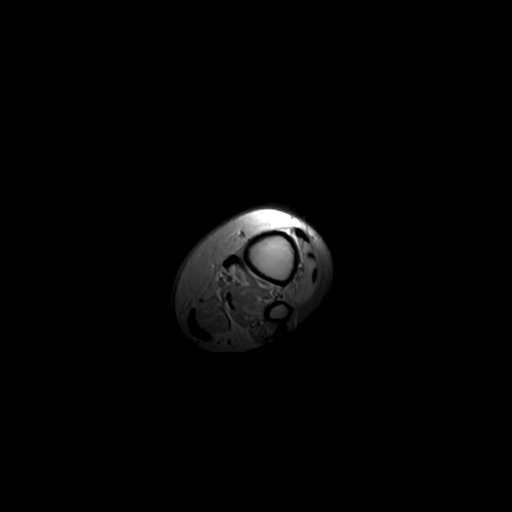

[10 of 40 positions shown; findings below may reference images not displayed]

FINDINGS: Bones/Joint/Cartilage

Patchy bone marrow edema in the proximal left tibial metadiaphysis
in a somewhat serpiginous distribution (series 11, images 17-18).
Subtle intermediate T1 marrow signal at this location without marrow
replacement. No associated cortical thickening. No periostitis.
Similar, although slightly less prominent changes are seen within
the contralateral right tibia on large field-of-view coronal
imaging.

No fracture.  No malalignment.  No erosion or marrow replacement.

Ligaments

No acute abnormality.

Muscles and Tendons

Normal muscle bulk and signal intensity without edema, atrophy, or
fatty infiltration. The included tendinous structures of the lower
leg are intact.

Soft tissues

No soft tissue edema or fluid collection. No solid or cystic mass.
No abnormal postcontrast enhancement.
IMPRESSION: 1. Patchy bone marrow edema in the proximal left tibial
metadiaphysis in a somewhat serpiginous distribution. Findings are
nonspecific and could reflect developing bone infarction. Red marrow
reconversion could also result in this appearance. Stress-related
changes are felt to be less likely given the distribution and lack
of cortical and periosteal changes.
2. Similar, although slightly less prominent changes are seen within
the contralateral right tibia on large field-of-view coronal
imaging.
3. Otherwise unremarkable pre and post-contrast MRI of the left
lower leg.

## 2021-01-31 MED ORDER — GABAPENTIN 100 MG PO CAPS
100.0000 mg | ORAL_CAPSULE | Freq: Three times a day (TID) | ORAL | Status: DC
  Administered 2021-01-31 – 2021-02-01 (×4): 100 mg via ORAL
  Filled 2021-01-31 (×4): qty 1

## 2021-01-31 MED ORDER — GADOBUTROL 1 MMOL/ML IV SOLN
9.2000 mL | Freq: Once | INTRAVENOUS | Status: AC | PRN
  Administered 2021-01-31: 9.2 mL via INTRAVENOUS

## 2021-01-31 MED ORDER — MORPHINE SULFATE (PF) 2 MG/ML IV SOLN
1.0000 mg | INTRAVENOUS | Status: DC | PRN
  Administered 2021-01-31 – 2021-02-02 (×7): 1 mg via INTRAVENOUS
  Filled 2021-01-31 (×7): qty 1

## 2021-01-31 MED ORDER — LORAZEPAM 2 MG/ML IJ SOLN
1.0000 mg | Freq: Once | INTRAMUSCULAR | Status: AC | PRN
  Administered 2021-01-31: 1 mg via INTRAVENOUS
  Filled 2021-01-31: qty 1

## 2021-01-31 NOTE — Progress Notes (Addendum)
PROGRESS NOTE        PATIENT DETAILS Name: Jessica Cooke Age: 60 y.o. Sex: female Date of Birth: August 10, 1960 Admit Date: 01/29/2021 Admitting Physician Dewayne Shorter Levora Dredge, MD [id number], Greggory Stallion, MD  Brief Narrative: Patient is a 60 y.o. female with history of HTN, HLD, hypothyroidism-diagnosed with COVID-19 on 10/7 (numerous family members positive as well)-presenting to the ED on 10/14 with severe left lower extremity pain (similar ED visit on 10/11-Doppler negative for DVT)  Subjective: Continues to have pain in her left leg-mostly near the shin area.  Objective: Vitals: Blood pressure 114/69, pulse 81, temperature 97.6 F (36.4 C), temperature source Oral, resp. rate 16, height  (1.753 m), weight 92.6 kg, SpO2 90 %.   Exam: Gen Exam:Alert awake-not in any distress HEENT:atraumatic, normocephalic Chest: B/L clear to auscultation anteriorly CVS:S1S2 regular Abdomen:soft non tender, non distended Extremities:no edema-dorsalis pedis pulse present in both legs.  Both legs warm to touch.  Left lower extremity does not appear swollen or inflamed/erythematous. Neurology: Non focal Skin: no rash   Pertinent Labs/Radiology: WBC: 9.7 Hb: 15.4  Na: 137  K: 3.3 Creatinine: 0.78 AST/ALT: 15/14 CK: 62 D-dimer: 2.94  10/11>> left lower extremity Doppler: No DVT 10/14>> MRI lumbar spine: Mild degenerative changes but no acute findings. 10/15>> CTA chest: No PE, bibasilar atelectatic changes.  10/7>> COVID PCR: Positive with CT value 22.7 10/15>> COVID PCR: Negative 10/16>> COVID PCR: Negative   Assessment/Plan: COVID-19 infection: Some mild cough but otherwise no symptoms.  She elected not to continue Remdesivir-her COVID PCR on 10/15/and 10/16 was negative.  She may have cleared the infection (claims she took a few days of paxlovid therapy-and then stopped treatment on her own)  Left lower extremity pain: Unclear etiology-no swelling/erythema  evident on exam.  Arterial dorsalis pedis pulse easily palpable-foot warm.  MRI lumbar spine negative for any significant spinal stenosis that could cause neurogenic claudication.  Repeating Doppler today-awaiting ABI as well.  If these ultrasound studies come back without any significant findings-May need to pursue CT/MRI imaging.  We will add Neurontin-continue with as needed narcotics/muscle relaxants and other supportive care.   Addendum: Informed by Elio Forget tech-that left lower extremity Doppler was negative for DVT, also normal waveforms/flow in all the major arteries.  Subsequently discussed with radiology-I have ordered a MRI of the left leg.  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  Tobacco abuse: Continue transdermal nicotine  Obesity: Estimated body mass index is 30.15 kg/m as calculated from the following:   Height as of this encounter:  (1.753 m).   Weight as of this encounter: 92.6 kg.   Procedures: None Consults: None DVT Prophylaxis: Lovenox Code Status:Full code Family Communication: None at bedside  Time spent: 35 minutes-Greater than 50% of this time was spent in counseling, explanation of diagnosis, planning of further management, and coordination of care.  Diet: Diet Order             Diet regular Room service appropriate? Yes; Fluid consistency: Thin  Diet effective now                      Disposition Plan: Status is: Inpatient The patient remains OBS appropriate and will d/c before 2 midnights.  Barriers to Discharge: COVID infection with severe left lower extremity pain-work-up in progress.  Antimicrobial agents: Anti-infectives (From admission, onward)    Start     Dose/Rate Route Frequency Ordered Stop   01/31/21 1000  remdesivir 100 mg in sodium chloride 0.9 %  100 mL IVPB       See Hyperspace for full Linked Orders Report.   100 mg 200 mL/hr over 30 Minutes Intravenous Daily 01/30/21 0018 02/04/21 0959   01/30/21 0200  remdesivir 200 mg in sodium chloride 0.9% 250 mL IVPB       See Hyperspace for full Linked Orders Report.   200 mg 580 mL/hr over 30 Minutes Intravenous Once 01/30/21 0018     01/29/21 2300  cefTRIAXone (ROCEPHIN) 1 g in sodium chloride 0.9 % 100 mL IVPB        1 g 200 mL/hr over 30 Minutes Intravenous  Once 01/29/21 2258 01/30/21 0008   01/29/21 2300  azithromycin (ZITHROMAX) 500 mg in sodium chloride 0.9 % 250 mL IVPB        500 mg 250 mL/hr over 60 Minutes Intravenous  Once 01/29/21 2258 01/30/21 0139        MEDICATIONS: Scheduled Meds:  acetaminophen  1,000 mg Oral Q8H   enoxaparin (LOVENOX) injection  40 mg Subcutaneous Q24H   gabapentin  100 mg Oral TID   guaiFENesin  600 mg Oral BID   levothyroxine  100 mcg Oral Q0600   nicotine  21 mg Transdermal Daily   Continuous Infusions:  remdesivir 200 mg in sodium chloride 0.9% 250 mL IVPB     Followed by   remdesivir 100 mg in NS 100 mL     PRN Meds:.albuterol, methocarbamol, morphine injection, ondansetron **OR** ondansetron (ZOFRAN) IV, oxyCODONE   I have personally reviewed following labs and imaging studies  LABORATORY DATA: CBC: Recent Labs  Lab 01/29/21 1440 01/30/21 0353 01/31/21 0206  WBC 8.0 8.0 9.7  NEUTROABS  --  6.6  --   HGB 17.0* 16.6* 15.4*  HCT 49.4* 47.9* 43.9  MCV 92.0 91.4 90.5  PLT 507* 499* 547*     Basic Metabolic Panel: Recent Labs  Lab 01/26/21 2125 01/29/21 1440 01/30/21 0353 01/31/21 0206  NA 139 135 135 137  K 3.7 5.7* 3.8 3.3*  CL 109 103 102 104  CO2 19* 19* 23 23  GLUCOSE 95 91 111* 111*  BUN 6* 9 8 13   CREATININE 0.70 0.82 0.74 0.78  CALCIUM 8.4* 9.0 9.0 9.5  MG  --   --  2.3  --      GFR: Estimated Creatinine Clearance: 88.4 mL/min (by C-G formula based on SCr of 0.78 mg/dL).  Liver Function  Tests: Recent Labs  Lab 01/30/21 0353 01/31/21 0206  AST 13* 15  ALT 15 14  ALKPHOS 44 37*  BILITOT 0.7 0.6  PROT 6.5 6.0*  ALBUMIN 3.7 3.7    No results for input(s): LIPASE, AMYLASE in the last 168 hours. No results for input(s): AMMONIA in the last 168 hours.  Coagulation Profile: No results for input(s): INR, PROTIME in the last 168 hours.  Cardiac Enzymes: Recent Labs  Lab 01/29/21 1440 01/30/21 0846 01/31/21 0206  CKTOTAL 136 98 62     BNP (last 3 results) No results for input(s): PROBNP in the last 8760 hours.  Lipid Profile: No results for input(s): CHOL, HDL, LDLCALC, TRIG, CHOLHDL, LDLDIRECT in the last 72 hours.  Thyroid Function Tests: No results for input(s): TSH, T4TOTAL, FREET4, T3FREE, THYROIDAB  in the last 72 hours.  Anemia Panel: Recent Labs    01/30/21 0353  FERRITIN 139     Urine analysis:    Component Value Date/Time   COLORURINE YELLOW 09/09/2016 2214   APPEARANCEUR CLEAR 09/09/2016 2214   LABSPEC 1.008 09/09/2016 2214   PHURINE 5.0 09/09/2016 2214   GLUCOSEU NEGATIVE 09/09/2016 2214   HGBUR NEGATIVE 09/09/2016 2214   BILIRUBINUR NEGATIVE 09/09/2016 2214   KETONESUR NEGATIVE 09/09/2016 2214   PROTEINUR NEGATIVE 09/09/2016 2214   UROBILINOGEN 1.0 10/05/2013 2030   NITRITE NEGATIVE 09/09/2016 2214   LEUKOCYTESUR NEGATIVE 09/09/2016 2214    Sepsis Labs: Lactic Acid, Venous    Component Value Date/Time   LATICACIDVEN 1.16 09/12/2017 1925    MICROBIOLOGY: Recent Results (from the past 240 hour(s))  Resp Panel by RT-PCR (Flu A&B, Covid) Nasopharyngeal Swab     Status: Abnormal   Collection Time: 01/22/21 10:13 AM   Specimen: Nasopharyngeal Swab; Nasopharyngeal(NP) swabs in vial transport medium  Result Value Ref Range Status   SARS Coronavirus 2 by RT PCR POSITIVE (A) NEGATIVE Final    Comment: RESULT CALLED TO, READ BACK BY AND VERIFIED WITH: KERRY D. RN ON 01/22/2021 @ 1629 BY MECIAL J. (NOTE) SARS-CoV-2 target nucleic  acids are DETECTED.  The SARS-CoV-2 RNA is generally detectable in upper respiratory specimens during the acute phase of infection. Positive results are indicative of the presence of the identified virus, but do not rule out bacterial infection or co-infection with other pathogens not detected by the test. Clinical correlation with patient history and other diagnostic information is necessary to determine patient infection status. The expected result is Negative.  Fact Sheet for Patients: BloggerCourse.com  Fact Sheet for Healthcare Providers: SeriousBroker.it  This test is not yet approved or cleared by the [place] FDA and  has been authorized for detection and/or diagnosis of SARS-CoV-2 by FDA under an Emergency Use Authorization (EUA).  This EUA will remain in effect (meaning this  test can be used) for the duration of  the COVID-19 declaration under Section 564(b)(1) of the Act, 21 U.S.C. section 360bbb-3(b)(1), unless the authorization is terminated or revoked sooner.     Influenza A by PCR NEGATIVE NEGATIVE Final   Influenza B by PCR NEGATIVE NEGATIVE Final    Comment: (NOTE) The Xpert Xpress SARS-CoV-2/FLU/RSV plus assay is intended as an aid in the diagnosis of influenza from Nasopharyngeal swab specimens and should not be used as a sole basis for treatment. Nasal washings and aspirates are unacceptable for Xpert Xpress SARS-CoV-2/FLU/RSV testing.  Fact Sheet for Patients: BloggerCourse.com  Fact Sheet for Healthcare Providers: SeriousBroker.it  This test is not yet approved or cleared by the [place] FDA and has been authorized for detection and/or diagnosis of SARS-CoV-2 by FDA under an Emergency Use Authorization (EUA). This EUA will remain in effect (meaning this test can be used) for the duration of the COVID-19 declaration under Section 564(b)(1) of  the Act, 21 U.S.C. section 360bbb-3(b)(1), unless the authorization is terminated or revoked.  Performed at [facility], [address] [place], [place] [address]   Resp Panel by RT-PCR (Flu A&B, Covid) Nasopharyngeal Swab     Status: None   Collection Time: 01/30/21 12:36 AM   Specimen: Nasopharyngeal Swab; Nasopharyngeal(NP) swabs in vial transport medium  Result Value Ref Range Status   SARS Coronavirus 2 by RT PCR NEGATIVE NEGATIVE Final    Comment: (NOTE) SARS-CoV-2 target nucleic acids are NOT DETECTED.  The SARS-CoV-2 RNA is  generally detectable in upper respiratory specimens during the acute phase of infection. The lowest concentration of SARS-CoV-2 viral copies this assay can detect is 138 copies/mL. A negative result does not preclude SARS-Cov-2 infection and should not be used as the sole basis for treatment or other patient management decisions. A negative result may occur with  improper specimen collection/handling, submission of specimen other than nasopharyngeal swab, presence of viral mutation(s) within the areas targeted by this assay, and inadequate number of viral copies(<138 copies/mL). A negative result must be combined with clinical observations, patient history, and epidemiological information. The expected result is Negative.  Fact Sheet for Patients:  BloggerCourse.com  Fact Sheet for Healthcare Providers:  SeriousBroker.it  This test is no t yet approved or cleared by the [place] FDA and  has been authorized for detection and/or diagnosis of SARS-CoV-2 by FDA under an Emergency Use Authorization (EUA). This EUA will remain  in effect (meaning this test can be used) for the duration of the COVID-19 declaration under Section 564(b)(1) of the Act, 21 U.S.C.section 360bbb-3(b)(1), unless the authorization is terminated  or revoked sooner.       Influenza A by PCR NEGATIVE  NEGATIVE Final   Influenza B by PCR NEGATIVE NEGATIVE Final    Comment: (NOTE) The Xpert Xpress SARS-CoV-2/FLU/RSV plus assay is intended as an aid in the diagnosis of influenza from Nasopharyngeal swab specimens and should not be used as a sole basis for treatment. Nasal washings and aspirates are unacceptable for Xpert Xpress SARS-CoV-2/FLU/RSV testing.  Fact Sheet for Patients: BloggerCourse.com  Fact Sheet for Healthcare Providers: SeriousBroker.it  This test is not yet approved or cleared by the [place] FDA and has been authorized for detection and/or diagnosis of SARS-CoV-2 by FDA under an Emergency Use Authorization (EUA). This EUA will remain in effect (meaning this test can be used) for the duration of the COVID-19 declaration under Section 564(b)(1) of the Act, 21 U.S.C. section 360bbb-3(b)(1), unless the authorization is terminated or revoked.  Performed at [facility], [address] [place], [place] [address]   Resp Panel by RT-PCR (Flu A&B, Covid) Nasopharyngeal Swab     Status: None   Collection Time: 01/31/21  7:19 AM   Specimen: Nasopharyngeal Swab; Nasopharyngeal(NP) swabs in vial transport medium  Result Value Ref Range Status   SARS Coronavirus 2 by RT PCR NEGATIVE NEGATIVE Final    Comment: (NOTE) SARS-CoV-2 target nucleic acids are NOT DETECTED.  The SARS-CoV-2 RNA is generally detectable in upper respiratory specimens during the acute phase of infection. The lowest concentration of SARS-CoV-2 viral copies this assay can detect is 138 copies/mL. A negative result does not preclude SARS-Cov-2 infection and should not be used as the sole basis for treatment or other patient management decisions. A negative result may occur with  improper specimen collection/handling, submission of specimen other than nasopharyngeal swab, presence of viral mutation(s) within the areas targeted by this assay,  and inadequate number of viral copies(<138 copies/mL). A negative result must be combined with clinical observations, patient history, and epidemiological information. The expected result is Negative.  Fact Sheet for Patients:  BloggerCourse.com  Fact Sheet for Healthcare Providers:  SeriousBroker.it  This test is no t yet approved or cleared by the [place] FDA and  has been authorized for detection and/or diagnosis of SARS-CoV-2 by FDA under an Emergency Use Authorization (EUA). This EUA will remain  in effect (meaning this test can be used) for the duration of the COVID-19 declaration  under Section 564(b)(1) of the Act, 21 U.S.C.section 360bbb-3(b)(1), unless the authorization is terminated  or revoked sooner.       Influenza A by PCR NEGATIVE NEGATIVE Final   Influenza B by PCR NEGATIVE NEGATIVE Final    Comment: (NOTE) The Xpert Xpress SARS-CoV-2/FLU/RSV plus assay is intended as an aid in the diagnosis of influenza from Nasopharyngeal swab specimens and should not be used as a sole basis for treatment. Nasal washings and aspirates are unacceptable for Xpert Xpress SARS-CoV-2/FLU/RSV testing.  Fact Sheet for Patients: BloggerCourse.com  Fact Sheet for Healthcare Providers: SeriousBroker.it  This test is not yet approved or cleared by the [place] FDA and has been authorized for detection and/or diagnosis of SARS-CoV-2 by FDA under an Emergency Use Authorization (EUA). This EUA will remain in effect (meaning this test can be used) for the duration of the COVID-19 declaration under Section 564(b)(1) of the Act, 21 U.S.C. section 360bbb-3(b)(1), unless the authorization is terminated or revoked.  Performed at [facility], [address], [place], [place] [address]     RADIOLOGY STUDIES/RESULTS: DG Chest 2 View  Result Date: 01/29/2021 CLINICAL DATA:   Hypoxia EXAM: CHEST - 2 VIEW COMPARISON:  01/22/2021 FINDINGS: Cardiac shadow is stable. Lungs are well aerated bilaterally. Small posterior effusions are noted. Additionally there is focal infiltrate in what appears to be the right lower lobe posteriorly. No bony abnormality is seen. IMPRESSION: Right lower lobe infiltrate and small associated pleural effusions bilaterally. Electronically Signed   By: Alcide Clever M.D.   On: 01/29/2021 22:02   DG Tibia/Fibula Left  Result Date: 01/29/2021 CLINICAL DATA:  Lower left leg pain. EXAM: LEFT TIBIA AND FIBULA - 2 VIEW COMPARISON:  March 31, 2018 FINDINGS: There is no evidence of fracture or other focal bone lesions. Soft tissues are unremarkable. IMPRESSION: Negative. Electronically Signed   By: Aram Candela M.D.   On: 01/29/2021 20:46   CT Angio Chest PE W and/or Wo Contrast  Result Date: 01/30/2021 CLINICAL DATA:  Hypoxia EXAM: CT ANGIOGRAPHY CHEST WITH CONTRAST TECHNIQUE: Multidetector CT imaging of the chest was performed using the standard protocol during bolus administration of intravenous contrast. Multiplanar CT image reconstructions and MIPs were obtained to evaluate the vascular anatomy. CONTRAST:  49mL OMNIPAQUE IOHEXOL 350 MG/ML SOLN COMPARISON:  Chest x-ray from the previous day. FINDINGS: Cardiovascular: Thoracic aorta and its branches are well visualized without evidence of aneurysmal dilatation or dissection. Mild atherosclerotic calcifications are seen. Coronary calcifications are noted. No significant cardiac enlargement is seen. The pulmonary artery shows a normal branching pattern bilaterally. No filling defect to suggest pulmonary embolism is noted. Mediastinum/Nodes: Thoracic inlet is within normal limits. No sizable hilar or mediastinal adenopathy is noted. The esophagus as visualized is within normal limits. Lungs/Pleura: Lungs demonstrate emphysematous change. Bilateral lower lobe atelectatic changes are noted with minimal  pleural effusions similar to that seen on prior plain film examination. No sizable parenchymal nodule is noted. No focal confluent infiltrate is seen. Upper Abdomen: Visualized upper abdomen shows no acute abnormality. Musculoskeletal: Degenerative changes of the thoracic spine are seen. No compression deformity is noted. No definitive rib fracture abnormality is noted. Review of the MIP images confirms the above findings. IMPRESSION: No evidence of pulmonary emboli. Bibasilar atelectatic changes with small effusions. Aortic Atherosclerosis (ICD10-I70.0) and Emphysema (ICD10-J43.9). Electronically Signed   By: Alcide Clever M.D.   On: 01/30/2021 00:13   MR LUMBAR SPINE WO CONTRAST  Result Date: 01/29/2021 CLINICAL DATA:  low back  pain, progressive neurologic deficit EXAM: MRI LUMBAR SPINE WITHOUT CONTRAST TECHNIQUE: Multiplanar, multisequence MR imaging of the lumbar spine was performed. No intravenous contrast was administered. COMPARISON:  X-ray 11/06/2019, MRI 12/19/2019 FINDINGS: Segmentation:  Standard. Alignment:  Physiologic. Vertebrae: No acute fracture. No evidence of discitis. Diffuse marrow heterogeneity without discrete marrow replacing lesion. Findings are nonspecific but can be seen in the setting of chronic anemia, smoking, and/or obesity. Conus medullaris and cauda equina: Conus extends to the L2 level. Conus and cauda equina appear normal. 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 This appears increased in size compared to the previous MRI. Disc levels: T12-L1: Unremarkable. L1-L2: Unremarkable. L2-L3: Unremarkable. L3-L4: Mild disc desiccation and minimal annular disc bulge. Mild bilateral facet arthropathy. No foraminal or canal stenosis.  Unchanged. L4-L5: Disc desiccation and mild annular disc bulge. Moderate bilateral facet arthropathy with small bilateral facet joint effusions. No foraminal or canal stenosis. Unchanged. L5-S1: Disc height loss with diffuse disc bulge. Mild bilateral facet arthropathy. Mild-to-moderate left and mild right foraminal stenosis. No canal stenosis. Findings may be minimally progressed compared to the previous study. IMPRESSION: 1. Mild degenerative changes of the lumbar spine greatest at L5-S1 where there is mild-to-moderate left and mild right foraminal stenosis. 2. No canal stenosis at any level. 3. 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 This appears increased in size compared to the previous MRI. Findings are most likely incidental and of doubtful clinical significance. If clinically desired, a follow-up MRI of the abdomen with contrast could be obtained in 1 year to assess stability. Left 4. Diffuse marrow heterogeneity without discrete marrow replacing lesion. Findings are nonspecific but can be seen in the setting of chronic anemia, smoking, and/or obesity. Electronically Signed   By: Duanne Guess D.O.   On: 01/29/2021 18:17     LOS: 1 day   Jeoffrey Massed, MD  [facility]    To contact the attending provider between 7A-7P or the covering provider during after hours 7P-7A, please log into the web site [email or web address] and access using universal [place] password for that web site. If you do not have the password, please call the hospital operator.  01/31/2021, 11:49 AM

## 2021-01-31 NOTE — Progress Notes (Signed)
Pt called for pain medication, educated patient on current prn medication schedule, next available dosing available at 1505, will notify md of increased patient pain.

## 2021-01-31 NOTE — Progress Notes (Signed)
VASCULAR LAB    Left lower extremity venous duplex has been performed.  See CV proc for preliminary results.   Afia Messenger, RVT 01/31/2021, 12:27 PM

## 2021-02-01 LAB — BASIC METABOLIC PANEL
Anion gap: 10 (ref 5–15)
BUN: 13 mg/dL (ref 8–23)
CO2: 24 mmol/L (ref 22–32)
Calcium: 9.4 mg/dL (ref 8.9–10.3)
Chloride: 105 mmol/L (ref 98–111)
Creatinine, Ser: 0.77 mg/dL (ref 0.44–1.00)
GFR, Estimated: >60 mL/min (ref >60)
Glucose, Bld: 93 mg/dL (ref 70–99)
Potassium: 3.2 mmol/L — ABNORMAL LOW (ref 3.5–5.1)
Sodium: 139 mmol/L (ref 135–145)

## 2021-02-01 LAB — C-REACTIVE PROTEIN: CRP: 0.5 mg/dL (ref <1.0)

## 2021-02-01 LAB — CK: Total CK: 62 U/L (ref 38–234)

## 2021-02-01 MED ORDER — POTASSIUM CHLORIDE CRYS ER 20 MEQ PO TBCR
40.0000 meq | EXTENDED_RELEASE_TABLET | Freq: Once | ORAL | Status: AC
  Administered 2021-02-01: 40 meq via ORAL
  Filled 2021-02-01: qty 2

## 2021-02-01 MED ORDER — GABAPENTIN 100 MG PO CAPS
200.0000 mg | ORAL_CAPSULE | Freq: Three times a day (TID) | ORAL | Status: DC
  Administered 2021-02-01 (×2): 200 mg via ORAL
  Filled 2021-02-01 (×2): qty 2

## 2021-02-01 MED ORDER — ASPIRIN EC 325 MG PO TBEC
325.0000 mg | DELAYED_RELEASE_TABLET | Freq: Every day | ORAL | Status: DC
  Administered 2021-02-01 – 2021-02-03 (×3): 325 mg via ORAL
  Filled 2021-02-01 (×3): qty 1

## 2021-02-01 NOTE — Plan of Care (Signed)

## 2021-02-01 NOTE — Progress Notes (Addendum)
PROGRESS NOTE        PATIENT DETAILS Name: Jessica Cooke Age: 60 y.o. Sex: female Date of Birth: 30-Mar-1961 Admit Date: 01/29/2021 Admitting Physician Dewayne Shorter Levora Dredge, MD [id number], Greggory Stallion, MD  Brief Narrative: Patient is a 60 y.o. female with history of HTN, HLD, hypothyroidism-diagnosed with COVID-19 on 10/7 (numerous family members positive as well)-presenting to the ED on 10/14 with severe left lower extremity pain (similar ED visit on 10/11-Doppler negative for DVT)  Subjective: Continues to have pain in the left mid leg area.  Claims difficult for her to walk due to severe pain.  Objective: Vitals: Blood pressure 115/67, pulse 68, temperature 98 F (36.7 C), temperature source Axillary, resp. rate 20, height 5\' 9"  (1.753 m), weight 92.6 kg, SpO2 95 %.   Exam: Gen Exam:Alert awake-not in any distress HEENT:atraumatic, normocephalic Chest: B/L clear to auscultation anteriorly CVS:S1S2 regular Abdomen:soft non tender, non distended Extremities: left leg without any erythema/swelling.  Both legs are warm to touch-dorsalis pedis pulse easily palpable. Neurology: Non focal Skin: no rash   Pertinent Labs/Radiology: K: 3.2 CK: 62 CRP: 0.5   10/11>> left lower extremity Doppler: No DVT 10/14>> MRI lumbar spine: Mild degenerative changes but no acute findings. 10/15>> CTA chest: No PE, bibasilar atelectatic changes. 10/16>> left lower extremity Doppler: No DVT. 10/16>> MRI left tibia/fibula: Patchy bone marrow edema in proximal left tibia-?  Bone infarct.   10/7>> COVID PCR: Positive with CT value 22.7 10/15>> COVID PCR: Negative 10/16>> COVID PCR: Negative   Assessment/Plan: COVID-19 infection: Some mild cough but otherwise no symptoms.  She elected not to continue Remdesivir-her COVID PCR on 10/15/and 10/16 was negative.  She may have cleared the infection (claims she took a few days of paxlovid therapy-and then stopped treatment on  her own).  Doubt she requires any further isolation.  Left lower extremity pain: Unclear etiology-no swelling/erythema evident on exam.  Doppler studies x2 negative for DVT, foot warm-dorsalis pedis pulse easily palpable.  MRI lumbar spine negative for any significant spinal stenosis.  MRI left leg done on 10/16-question of developing bone infarct.  Continues to have significant pain-increase Neurontin to 200 mg 3 times daily-get PT and see how she does with ambulation.  I have asked orthopedics to evaluate and provide further input   Addendum: Discussed with orthopedics-Michael Jeffrey-PA-C who in turn discussed with orthopedic MD on-call-recommendations are for pain management/mobilization and weightbearing as tolerated.  Hypokalemia: Replete and recheck.  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  Tobacco abuse: Continue transdermal nicotine  Obesity: Estimated body mass index is 30.15 kg/m as calculated from the following:   Height as of this encounter: 5\' 9"  (1.753 m).   Weight as of this encounter: 92.6 kg.   Procedures: None Consults: None DVT Prophylaxis: Lovenox Code Status:Full code Family Communication: None at bedside  Time spent: 25 minutes-Greater than 50% of this time was spent in counseling, explanation of diagnosis, planning of further management, and coordination of care.  Diet: Diet Order             Diet regular Room service appropriate? Yes; Fluid consistency: Thin  Diet effective now  Disposition Plan: Status is: Inpatient The patient remains OBS appropriate and will d/c before 2 midnights.   Barriers to Discharge: COVID infection with severe left lower extremity pain-work-up in progress.  Antimicrobial agents: Anti-infectives (From admission, onward)    Start      Dose/Rate Route Frequency Ordered Stop   01/31/21 1000  remdesivir 100 mg in sodium chloride 0.9 % 100 mL IVPB  Status:  Discontinued       See Hyperspace for full Linked Orders Report.   100 mg 200 mL/hr over 30 Minutes Intravenous Daily 01/30/21 0018 01/31/21 1154   01/30/21 0200  remdesivir 200 mg in sodium chloride 0.9% 250 mL IVPB  Status:  Discontinued       See Hyperspace for full Linked Orders Report.   200 mg 580 mL/hr over 30 Minutes Intravenous Once 01/30/21 0018 01/31/21 1154   01/29/21 2300  cefTRIAXone (ROCEPHIN) 1 g in sodium chloride 0.9 % 100 mL IVPB        1 g 200 mL/hr over 30 Minutes Intravenous  Once 01/29/21 2258 01/30/21 0008   01/29/21 2300  azithromycin (ZITHROMAX) 500 mg in sodium chloride 0.9 % 250 mL IVPB        500 mg 250 mL/hr over 60 Minutes Intravenous  Once 01/29/21 2258 01/30/21 0139        MEDICATIONS: Scheduled Meds:  acetaminophen  1,000 mg Oral Q8H   aspirin EC  325 mg Oral Daily   enoxaparin (LOVENOX) injection  40 mg Subcutaneous Q24H   gabapentin  200 mg Oral TID   guaiFENesin  600 mg Oral BID   levothyroxine  100 mcg Oral Q0600   nicotine  21 mg Transdermal Daily   Continuous Infusions:   PRN Meds:.albuterol, methocarbamol, morphine injection, ondansetron **OR** ondansetron (ZOFRAN) IV, oxyCODONE   I have personally reviewed following labs and imaging studies  LABORATORY DATA: CBC: Recent Labs  Lab 01/29/21 1440 01/30/21 0353 01/31/21 0206  WBC 8.0 8.0 9.7  NEUTROABS  --  6.6  --   HGB 17.0* 16.6* 15.4*  HCT 49.4* 47.9* 43.9  MCV 92.0 91.4 90.5  PLT 507* 499* 547*     Basic Metabolic Panel: Recent Labs  Lab 01/26/21 2125 01/29/21 1440 01/30/21 0353 01/31/21 0206 02/01/21 0128  NA 139 135 135 137 139  K 3.7 5.7* 3.8 3.3* 3.2*  CL 109 103 102 104 105  CO2 19* 19* 23 23 24   GLUCOSE 95 91 111* 111* 93  BUN 6* 9 8 13 13   CREATININE 0.70 0.82 0.74 0.78 0.77  CALCIUM 8.4* 9.0 9.0 9.5 9.4  MG  --   --  2.3  --    --      GFR: Estimated Creatinine Clearance: 88.4 mL/min (by C-G formula based on SCr of 0.77 mg/dL).  Liver Function Tests: Recent Labs  Lab 01/30/21 0353 01/31/21 0206  AST 13* 15  ALT 15 14  ALKPHOS 44 37*  BILITOT 0.7 0.6  PROT 6.5 6.0*  ALBUMIN 3.7 3.7    No results for input(s): LIPASE, AMYLASE in the last 168 hours. No results for input(s): AMMONIA in the last 168 hours.  Coagulation Profile: No results for input(s): INR, PROTIME in the last 168 hours.  Cardiac Enzymes: Recent Labs  Lab 01/29/21 1440 01/30/21 0846 01/31/21 0206 02/01/21 0128  CKTOTAL 136 98 62 62     BNP (last 3 results) No results for input(s): PROBNP in the last 8760 hours.  Lipid Profile: No results for input(s): CHOL,  HDL, LDLCALC, TRIG, CHOLHDL, LDLDIRECT in the last 72 hours.  Thyroid Function Tests: No results for input(s): TSH, T4TOTAL, FREET4, T3FREE, THYROIDAB in the last 72 hours.  Anemia Panel: Recent Labs    01/30/21 0353  FERRITIN 139     Urine analysis:    Component Value Date/Time   COLORURINE YELLOW 09/09/2016 2214   APPEARANCEUR CLEAR 09/09/2016 2214   LABSPEC 1.008 09/09/2016 2214   PHURINE 5.0 09/09/2016 2214   GLUCOSEU NEGATIVE 09/09/2016 2214   HGBUR NEGATIVE 09/09/2016 2214   BILIRUBINUR NEGATIVE 09/09/2016 2214   KETONESUR NEGATIVE 09/09/2016 2214   PROTEINUR NEGATIVE 09/09/2016 2214   UROBILINOGEN 1.0 10/05/2013 2030   NITRITE NEGATIVE 09/09/2016 2214   LEUKOCYTESUR NEGATIVE 09/09/2016 2214    Sepsis Labs: Lactic Acid, Venous    Component Value Date/Time   LATICACIDVEN 1.16 09/12/2017 1925    MICROBIOLOGY: Recent Results (from the past 240 hour(s))  Resp Panel by RT-PCR (Flu A&B, Covid) Nasopharyngeal Swab     Status: None   Collection Time: 01/30/21 12:36 AM   Specimen: Nasopharyngeal Swab; Nasopharyngeal(NP) swabs in vial transport medium  Result Value Ref Range Status   SARS Coronavirus 2 by RT PCR NEGATIVE NEGATIVE Final     Comment: (NOTE) SARS-CoV-2 target nucleic acids are NOT DETECTED.  The SARS-CoV-2 RNA is generally detectable in upper respiratory specimens during the acute phase of infection. The lowest concentration of SARS-CoV-2 viral copies this assay can detect is 138 copies/mL. A negative result does not preclude SARS-Cov-2 infection and should not be used as the sole basis for treatment or other patient management decisions. A negative result may occur with  improper specimen collection/handling, submission of specimen other than nasopharyngeal swab, presence of viral mutation(s) within the areas targeted by this assay, and inadequate number of viral copies(<138 copies/mL). A negative result must be combined with clinical observations, patient history, and epidemiological information. The expected result is Negative.  Fact Sheet for Patients:  BloggerCourse.com  Fact Sheet for Healthcare Providers:  SeriousBroker.it  This test is no t yet approved or cleared by the [place] FDA and  has been authorized for detection and/or diagnosis of SARS-CoV-2 by FDA under an Emergency Use Authorization (EUA). This EUA will remain  in effect (meaning this test can be used) for the duration of the COVID-19 declaration under Section 564(b)(1) of the Act, 21 U.S.C.section 360bbb-3(b)(1), unless the authorization is terminated  or revoked sooner.       Influenza A by PCR NEGATIVE NEGATIVE Final   Influenza B by PCR NEGATIVE NEGATIVE Final    Comment: (NOTE) The Xpert Xpress SARS-CoV-2/FLU/RSV plus assay is intended as an aid in the diagnosis of influenza from Nasopharyngeal swab specimens and should not be used as a sole basis for treatment. Nasal washings and aspirates are unacceptable for Xpert Xpress SARS-CoV-2/FLU/RSV testing.  Fact Sheet for Patients: BloggerCourse.com  Fact Sheet for Healthcare  Providers: SeriousBroker.it  This test is not yet approved or cleared by the [place] FDA and has been authorized for detection and/or diagnosis of SARS-CoV-2 by FDA under an Emergency Use Authorization (EUA). This EUA will remain in effect (meaning this test can be used) for the duration of the COVID-19 declaration under Section 564(b)(1) of the Act, 21 U.S.C. section 360bbb-3(b)(1), unless the authorization is terminated or revoked.  Performed at [facility], [address], [place], [place] [address]   Resp Panel by RT-PCR (Flu A&B, Covid) Nasopharyngeal Swab     Status: None  Collection Time: 01/31/21  7:19 AM   Specimen: Nasopharyngeal Swab; Nasopharyngeal(NP) swabs in vial transport medium  Result Value Ref Range Status   SARS Coronavirus 2 by RT PCR NEGATIVE NEGATIVE Final    Comment: (NOTE) SARS-CoV-2 target nucleic acids are NOT DETECTED.  The SARS-CoV-2 RNA is generally detectable in upper respiratory specimens during the acute phase of infection. The lowest concentration of SARS-CoV-2 viral copies this assay can detect is 138 copies/mL. A negative result does not preclude SARS-Cov-2 infection and should not be used as the sole basis for treatment or other patient management decisions. A negative result may occur with  improper specimen collection/handling, submission of specimen other than nasopharyngeal swab, presence of viral mutation(s) within the areas targeted by this assay, and inadequate number of viral copies(<138 copies/mL). A negative result must be combined with clinical observations, patient history, and epidemiological information. The expected result is Negative.  Fact Sheet for Patients:  BloggerCourse.com  Fact Sheet for Healthcare Providers:  SeriousBroker.it  This test is no t yet approved or cleared by the [place] FDA and  has been authorized for  detection and/or diagnosis of SARS-CoV-2 by FDA under an Emergency Use Authorization (EUA). This EUA will remain  in effect (meaning this test can be used) for the duration of the COVID-19 declaration under Section 564(b)(1) of the Act, 21 U.S.C.section 360bbb-3(b)(1), unless the authorization is terminated  or revoked sooner.       Influenza A by PCR NEGATIVE NEGATIVE Final   Influenza B by PCR NEGATIVE NEGATIVE Final    Comment: (NOTE) The Xpert Xpress SARS-CoV-2/FLU/RSV plus assay is intended as an aid in the diagnosis of influenza from Nasopharyngeal swab specimens and should not be used as a sole basis for treatment. Nasal washings and aspirates are unacceptable for Xpert Xpress SARS-CoV-2/FLU/RSV testing.  Fact Sheet for Patients: BloggerCourse.com  Fact Sheet for Healthcare Providers: SeriousBroker.it  This test is not yet approved or cleared by the [place] FDA and has been authorized for detection and/or diagnosis of SARS-CoV-2 by FDA under an Emergency Use Authorization (EUA). This EUA will remain in effect (meaning this test can be used) for the duration of the COVID-19 declaration under Section 564(b)(1) of the Act, 21 U.S.C. section 360bbb-3(b)(1), unless the authorization is terminated or revoked.  Performed at [facility], [address] [place], [place] [address]     RADIOLOGY STUDIES/RESULTS: MR TIBIA FIBULA LEFT W WO CONTRAST  Result Date: 02/01/2021 CLINICAL DATA:  Lower leg pain, stress fracture suspected, neg xray Recent COVID with unexplained left leg pain-Doppler x2 negative, x-ray negative for any bony abnormalities. EXAM: MRI OF LOWER LEFT EXTREMITY WITHOUT AND WITH CONTRAST TECHNIQUE: Multiplanar, multisequence MR imaging of the left tibia and fibula was performed both before and after administration of intravenous contrast. CONTRAST:  9.28mL GADAVIST GADOBUTROL 1 MMOL/ML IV SOLN  COMPARISON:  X-ray 01/29/2021 FINDINGS: Bones/Joint/Cartilage Patchy bone marrow edema in the proximal left tibial metadiaphysis in a somewhat serpiginous distribution (series 11, images 17-18). Subtle intermediate T1 marrow signal at this location without marrow replacement. No associated cortical thickening. No periostitis. Similar, although slightly less prominent changes are seen within the contralateral right tibia on large field-of-view coronal imaging. No fracture.  No malalignment.  No erosion or marrow replacement. Ligaments No acute abnormality. Muscles and Tendons Normal muscle bulk and signal intensity without edema, atrophy, or fatty infiltration. The included tendinous structures of the lower leg are intact. Soft tissues No soft tissue edema or fluid collection.  No solid or cystic mass. No abnormal postcontrast enhancement. IMPRESSION: 1. Patchy bone marrow edema in the proximal left tibial metadiaphysis in a somewhat serpiginous distribution. Findings are nonspecific and could reflect developing bone infarction. Red marrow reconversion could also result in this appearance. Stress-related changes are felt to be less likely given the distribution and lack of cortical and periosteal changes. 2. Similar, although slightly less prominent changes are seen within the contralateral right tibia on large field-of-view coronal imaging. 3. Otherwise unremarkable pre and post-contrast MRI of the left lower leg. Electronically Signed   By: Duanne Guess D.O.   On: 02/01/2021 08:07   VAS [place] LOWER EXTREMITY VENOUS (DVT)  Result Date: 01/31/2021  Lower Venous DVT Study Patient Name:  Jessica Cooke  Date of Exam:   01/31/2021 Medical Rec #: [id number]       Accession #:    [id number] Date of Birth: March 15, 1961       Patient Gender: F Patient Age:   11 years Exam Location:  [facility] Procedure:      VAS [place] LOWER EXTREMITY VENOUS (DVT) Referring Phys: Jeoffrey Massed  --------------------------------------------------------------------------------  Indications: Left lateral shin pain. Recent Covid infection.  Comparison Study: Prior negative left LEV done 01/26/21 Performing Technologist: Sherren Kerns RVS  Examination Guidelines: A complete evaluation includes B-mode imaging, spectral Doppler, color Doppler, and power Doppler as needed of all accessible portions of each vessel. Bilateral testing is considered an integral part of a complete examination. Limited examinations for reoccurring indications may be performed as noted. The reflux portion of the exam is performed with the patient in reverse Trendelenburg.  +-----+---------------+---------+-----------+----------+--------------+ RIGHTCompressibilityPhasicitySpontaneityPropertiesThrombus Aging +-----+---------------+---------+-----------+----------+--------------+ CFV  Full           Yes      Yes                                 +-----+---------------+---------+-----------+----------+--------------+   +---------+---------------+---------+-----------+----------+--------------+ LEFT     CompressibilityPhasicitySpontaneityPropertiesThrombus Aging +---------+---------------+---------+-----------+----------+--------------+ CFV      Full           Yes      Yes                                 +---------+---------------+---------+-----------+----------+--------------+ SFJ      Full                                                        +---------+---------------+---------+-----------+----------+--------------+ FV Prox  Full                                                        +---------+---------------+---------+-----------+----------+--------------+ FV Mid   Full                                                        +---------+---------------+---------+-----------+----------+--------------+ FV DistalFull                                                         +---------+---------------+---------+-----------+----------+--------------+  PFV      Full                                                        +---------+---------------+---------+-----------+----------+--------------+ POP      Full           Yes      Yes                                 +---------+---------------+---------+-----------+----------+--------------+ PTV      Full                                                        +---------+---------------+---------+-----------+----------+--------------+ PERO     Full                                                        +---------+---------------+---------+-----------+----------+--------------+ Incidentally, patient has normal arterial waveforms throughout the left lower extremity and into the left foot.    Summary: RIGHT: - No evidence of common femoral vein obstruction.  LEFT: - There is no evidence of deep vein thrombosis in the lower extremity.  - Ultrasound characteristics of enlarged lymph nodes noted in the groin.  *See table(s) above for measurements and observations. Electronically signed by Sherald Hess MD on 01/31/2021 at 3:44:24 PM.    Final      LOS: 2 days   Jeoffrey Massed, MD  [facility]    To contact the attending provider between 7A-7P or the covering provider during after hours 7P-7A, please log into the web site [email or web address] and access using universal [place] password for that web site. If you do not have the password, please call the hospital operator.  02/01/2021, 10:20 AM

## 2021-02-02 MED ORDER — GABAPENTIN 300 MG PO CAPS
300.0000 mg | ORAL_CAPSULE | Freq: Three times a day (TID) | ORAL | Status: DC
  Administered 2021-02-02 (×3): 300 mg via ORAL
  Filled 2021-02-02 (×3): qty 1

## 2021-02-02 NOTE — Evaluation (Signed)
Physical Therapy Evaluation Patient Details Name: Jessica Cooke MRN: [id number] DOB: 05-07-1960 Today's Date: 02/02/2021  History of Present Illness  60 yo presented to ER 01/30/21 with c/o left leg pain. Diagnosed with COVID 01/22/21; LE U/S negative for DVT on 01-26-2021;  MRI back did not show any spinal stenosis; repeat LLE doppler 10/16 negative; 10/16>> MRI left tibia/fibula: Patchy bone marrow edema in proximal left tibia-?  Bone infarct.  PMH- HTN, hypothyroidism, hyperlipidemia,  Clinical Impression   Pt admitted secondary to problem above with deficits below. PTA patient was independent with all mobility and living with her spouse.  Pt currently requires min guard assist to ambulate 50 ft with RW. She has 3 steps to enter and will need PT education re: how to access home.  Anticipate patient will benefit from PT to address problems listed below.Will continue to follow acutely to maximize functional mobility independence and safety.          Recommendations for follow up therapy are one component of a multi-disciplinary discharge planning process, led by the attending physician.  Recommendations may be updated based on patient status, additional functional criteria and insurance authorization.  Follow Up Recommendations No PT follow up    Equipment Recommendations  None recommended by PT    Recommendations for Other Services       Precautions / Restrictions Precautions Precautions: Fall Restrictions Weight Bearing Restrictions: No      Mobility  Bed Mobility Overal bed mobility: Modified Independent             General bed mobility comments: incr time    Transfers Overall transfer level: Needs assistance Equipment used: Rolling walker (2 wheeled) Transfers: Sit to/from Stand Sit to Stand: Min guard         General transfer comment: vc for proper use of RW  Ambulation/Gait Ambulation/Gait assistance: Min guard Gait Distance (Feet): 50 Feet Assistive  device: Rolling walker (2 wheeled) Gait Pattern/deviations: Step-to pattern;Decreased stride length   Gait velocity interpretation: <1.8 ft/sec, indicate of risk for recurrent falls General Gait Details: vc for sequencing to minimize pain/pressure for LLE, pt occasionally getting off-sequence and required cues to correct  Stairs            Wheelchair Mobility    Modified Rankin (Stroke Patients Only)       Balance Overall balance assessment: No apparent balance deficits (not formally assessed)                                           Pertinent Vitals/Pain Pain Assessment: 0-10 Pain Score: 5  Pain Location: LLE below knee Pain Descriptors / Indicators: Discomfort Pain Intervention(s): Premedicated before session;Limited activity within patient's tolerance    Home Living Family/patient expects to be discharged to:: Private residence Living Arrangements: Spouse/significant other Available Help at Discharge: Family Type of Home: House Home Access: Stairs to enter Entrance Stairs-Rails: Can reach both Entrance Stairs-Number of Steps: 3 Home Layout: One level Home Equipment: Environmental consultant - 2 wheels;Bedside commode;Shower seat - built in      Prior Function Level of Independence: Independent               Higher education careers adviser        Extremity/Trunk Assessment   Upper Extremity Assessment Upper Extremity Assessment: Overall WFL for tasks assessed    Lower Extremity Assessment Lower Extremity Assessment: LLE deficits/detail LLE Deficits / Details:  painful below her knee; some edema noted LLE: Unable to fully assess due to pain    Cervical / Trunk Assessment Cervical / Trunk Assessment: Normal  Communication   Communication: No difficulties  Cognition Arousal/Alertness: Awake/alert Behavior During Therapy: WFL for tasks assessed/performed Overall Cognitive Status: Within Functional Limits for tasks assessed                                         General Comments      Exercises     Assessment/Plan    PT Assessment Patient needs continued PT services  PT Problem List Decreased activity tolerance;Decreased mobility;Decreased knowledge of use of DME;Pain       PT Treatment Interventions DME instruction;Gait training;Stair training;Functional mobility training;Therapeutic activities;Therapeutic exercise;Patient/family education    PT Goals (Current goals can be found in the Care Plan section)  Acute Rehab PT Goals Patient Stated Goal: for leg to be less painful PT Goal Formulation: With patient Time For Goal Achievement: 02/16/21 Potential to Achieve Goals: Good    Frequency Min 4X/week   Barriers to discharge        Co-evaluation               AM-PAC PT "6 Clicks" Mobility  Outcome Measure Help needed turning from your back to your side while in a flat bed without using bedrails?: None Help needed moving from lying on your back to sitting on the side of a flat bed without using bedrails?: None Help needed moving to and from a bed to a chair (including a wheelchair)?: A Little Help needed standing up from a chair using your arms (e.g., wheelchair or bedside chair)?: A Little Help needed to walk in hospital room?: A Little Help needed climbing 3-5 steps with a railing? : A Little 6 Click Score: 20    End of Session   Activity Tolerance: Patient tolerated treatment well Patient left: in chair;with call bell/phone within reach Nurse Communication: Mobility status;Other (comment) (3n1 placed outside room to be taken in (on precautions)) PT Visit Diagnosis: Difficulty in walking, not elsewhere classified (R26.2);Pain Pain - Right/Left: Left Pain - part of body: Leg    Time: 9024-0973 PT Time Calculation (min) (ACUTE ONLY): 27 min   Charges:   PT Evaluation $PT Eval Low Complexity: 1 Low PT Treatments $Gait Training: 8-22 mins         Jerolyn Center, PT Acute Rehabilitation Services   Pager [phone] Office [phone]   Zena Amos 02/02/2021, 11:02 AM

## 2021-02-02 NOTE — Progress Notes (Signed)
PROGRESS NOTE        PATIENT DETAILS Name: Jessica Cooke Age: 60 y.o. Sex: female Date of Birth: 1961/02/19 Admit Date: 01/29/2021 Admitting Physician Dewayne Shorter Levora Dredge, MD RUE:[id number], Greggory Stallion, MD  Brief Narrative: Patient is a 60 y.o. female with history of HTN, HLD, hypothyroidism-diagnosed with COVID-19 on 10/7 (numerous family members positive as well)-presenting to the ED on 10/14 with severe left lower extremity pain (similar ED visit on 10/11-Doppler negative for DVT)  Subjective: Claims pain is slowly improving but she continues to have a significant amount of pain.  Claims it is difficult to ambulate because of pain in the left leg.  Objective: Vitals: Blood pressure 122/73, pulse 80, temperature 98.2 F (36.8 C), temperature source Oral, resp. rate (!) 22, height 5\' 9"  (1.753 m), weight 92.6 kg, SpO2 96 %.   Exam: Gen Exam:Alert awake-not in any distress HEENT:atraumatic, normocephalic Chest: B/L clear to auscultation anteriorly CVS:S1S2 regular Abdomen:soft non tender, non distended Extremities: No erythema/swelling in left leg-dorsalis pedis pulse present.  Legs warm. Neurology: Non focal Skin: no rash   Pertinent Labs/Radiology: 10/11>> left lower extremity Doppler: No DVT 10/14>> MRI lumbar spine: Mild degenerative changes but no acute findings. 10/15>> CTA chest: No PE, bibasilar atelectatic changes. 10/16>> left lower extremity Doppler: No DVT. 10/16>> MRI left tibia/fibula: Patchy bone marrow edema in proximal left tibia-?  Bone infarct.   10/7>> COVID PCR: Positive with CT value 22.7 10/15>> COVID PCR: Negative 10/16>> COVID PCR: Negative   Assessment/Plan: COVID-19 infection: Mild cough-but otherwise asymptomatic.  Doubt any isolation required-she was initially diagnosed on 10/7-but has had to repeat PCR test negative..    Left lower extremity pain: Unclear etiology-no swelling/erythema evident on exam.  Doppler studies  x2 negative for DVT, foot warm-dorsalis pedis pulse easily palpable.  MRI lumbar spine negative for any significant spinal stenosis.  MRI left leg done on 10/16-shows possible developing bone infarct-Case was discussed with orthopedics on-call-recommendations for supportive care/pain management/mobilization and weightbearing as tolerated.  Pain gradually improving-but still quite significant-increase Neurontin to 300 mg 3 times a day, continue scheduled Tylenol-continue to minimize narcotics as much as possible.  Continue mobilization with PT.  If pain reasonably well controlled on oral regimen-suspect home in the next 1-2 days.    Hypokalemia: Repleted.  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  Tobacco abuse: Continue transdermal nicotine  Obesity: Estimated body mass index is 30.15 kg/m as calculated from the following:   Height as of this encounter: 5\' 9"  (1.753 m).   Weight as of this encounter: 92.6 kg.   Procedures: None Consults: None DVT Prophylaxis: Lovenox Code Status:Full code Family Communication: None at bedside  Time spent: 25 minutes-Greater than 50% of this time was spent in counseling, explanation of diagnosis, planning of further management, and coordination of care.  Diet: Diet Order             Diet regular Room service appropriate? Yes; Fluid consistency: Thin  Diet effective now                      Disposition Plan: Status is: Inpatient The patient remains OBS appropriate and will d/c before 2 midnights.   Barriers to Discharge: COVID infection with severe left lower  extremity pain-work-up in progress.  Antimicrobial agents: Anti-infectives (From admission, onward)    Start     Dose/Rate Route Frequency Ordered Stop   01/31/21 1000  remdesivir 100 mg in sodium chloride 0.9 % 100 mL IVPB   Status:  Discontinued       See Hyperspace for full Linked Orders Report.   100 mg 200 mL/hr over 30 Minutes Intravenous Daily 01/30/21 0018 01/31/21 1154   01/30/21 0200  remdesivir 200 mg in sodium chloride 0.9% 250 mL IVPB  Status:  Discontinued       See Hyperspace for full Linked Orders Report.   200 mg 580 mL/hr over 30 Minutes Intravenous Once 01/30/21 0018 01/31/21 1154   01/29/21 2300  cefTRIAXone (ROCEPHIN) 1 g in sodium chloride 0.9 % 100 mL IVPB        1 g 200 mL/hr over 30 Minutes Intravenous  Once 01/29/21 2258 01/30/21 0008   01/29/21 2300  azithromycin (ZITHROMAX) 500 mg in sodium chloride 0.9 % 250 mL IVPB        500 mg 250 mL/hr over 60 Minutes Intravenous  Once 01/29/21 2258 01/30/21 0139        MEDICATIONS: Scheduled Meds:  acetaminophen  1,000 mg Oral Q8H   aspirin EC  325 mg Oral Daily   enoxaparin (LOVENOX) injection  40 mg Subcutaneous Q24H   gabapentin  300 mg Oral TID   guaiFENesin  600 mg Oral BID   levothyroxine  100 mcg Oral Q0600   nicotine  21 mg Transdermal Daily   Continuous Infusions:   PRN Meds:.albuterol, methocarbamol, morphine injection, ondansetron **OR** ondansetron (ZOFRAN) IV, oxyCODONE   I have personally reviewed following labs and imaging studies  LABORATORY DATA: CBC: Recent Labs  Lab 01/29/21 1440 01/30/21 0353 01/31/21 0206  WBC 8.0 8.0 9.7  NEUTROABS  --  6.6  --   HGB 17.0* 16.6* 15.4*  HCT 49.4* 47.9* 43.9  MCV 92.0 91.4 90.5  PLT 507* 499* 547*     Basic Metabolic Panel: Recent Labs  Lab 01/26/21 2125 01/29/21 1440 01/30/21 0353 01/31/21 0206 02/01/21 0128  NA 139 135 135 137 139  K 3.7 5.7* 3.8 3.3* 3.2*  CL 109 103 102 104 105  CO2 19* 19* 23 23 24   GLUCOSE 95 91 111* 111* 93  BUN 6* 9 8 13 13   CREATININE 0.70 0.82 0.74 0.78 0.77  CALCIUM 8.4* 9.0 9.0 9.5 9.4  MG  --   --  2.3  --   --      GFR: Estimated Creatinine Clearance: 88.4 mL/min (by C-G formula based on SCr of 0.77  mg/dL).  Liver Function Tests: Recent Labs  Lab 01/30/21 0353 01/31/21 0206  AST 13* 15  ALT 15 14  ALKPHOS 44 37*  BILITOT 0.7 0.6  PROT 6.5 6.0*  ALBUMIN 3.7 3.7    No results for input(s): LIPASE, AMYLASE in the last 168 hours. No results for input(s): AMMONIA in the last 168 hours.  Coagulation Profile: No results for input(s): INR, PROTIME in the last 168 hours.  Cardiac Enzymes: Recent Labs  Lab 01/29/21 1440 01/30/21 0846 01/31/21 0206 02/01/21 0128  CKTOTAL 136 98 62 62     BNP (last 3 results) No results for input(s): PROBNP in the last 8760 hours.  Lipid Profile: No results for input(s): CHOL, HDL, LDLCALC, TRIG, CHOLHDL, LDLDIRECT in the last 72 hours.  Thyroid Function Tests: No results for input(s): TSH, T4TOTAL, FREET4, T3FREE, THYROIDAB in the last 72  hours.  Anemia Panel: No results for input(s): VITAMINB12, FOLATE, FERRITIN, TIBC, IRON, RETICCTPCT in the last 72 hours.   Urine analysis:    Component Value Date/Time   COLORURINE YELLOW 09/09/2016 2214   APPEARANCEUR CLEAR 09/09/2016 2214   LABSPEC 1.008 09/09/2016 2214   PHURINE 5.0 09/09/2016 2214   GLUCOSEU NEGATIVE 09/09/2016 2214   HGBUR NEGATIVE 09/09/2016 2214   BILIRUBINUR NEGATIVE 09/09/2016 2214   KETONESUR NEGATIVE 09/09/2016 2214   PROTEINUR NEGATIVE 09/09/2016 2214   UROBILINOGEN 1.0 10/05/2013 2030   NITRITE NEGATIVE 09/09/2016 2214   LEUKOCYTESUR NEGATIVE 09/09/2016 2214    Sepsis Labs: Lactic Acid, Venous    Component Value Date/Time   LATICACIDVEN 1.16 09/12/2017 1925    MICROBIOLOGY: Recent Results (from the past 240 hour(s))  Resp Panel by RT-PCR (Flu A&B, Covid) Nasopharyngeal Swab     Status: None   Collection Time: 01/30/21 12:36 AM   Specimen: Nasopharyngeal Swab; Nasopharyngeal(NP) swabs in vial transport medium  Result Value Ref Range Status   SARS Coronavirus 2 by RT PCR NEGATIVE NEGATIVE Final    Comment: (NOTE) SARS-CoV-2 target nucleic acids are  NOT DETECTED.  The SARS-CoV-2 RNA is generally detectable in upper respiratory specimens during the acute phase of infection. The lowest concentration of SARS-CoV-2 viral copies this assay can detect is 138 copies/mL. A negative result does not preclude SARS-Cov-2 infection and should not be used as the sole basis for treatment or other patient management decisions. A negative result may occur with  improper specimen collection/handling, submission of specimen other than nasopharyngeal swab, presence of viral mutation(s) within the areas targeted by this assay, and inadequate number of viral copies(<138 copies/mL). A negative result must be combined with clinical observations, patient history, and epidemiological information. The expected result is Negative.  Fact Sheet for Patients:  BloggerCourse.com  Fact Sheet for Healthcare Providers:  SeriousBroker.it  This test is no t yet approved or cleared by the [place] FDA and  has been authorized for detection and/or diagnosis of SARS-CoV-2 by FDA under an Emergency Use Authorization (EUA). This EUA will remain  in effect (meaning this test can be used) for the duration of the COVID-19 declaration under Section 564(b)(1) of the Act, 21 U.S.C.section 360bbb-3(b)(1), unless the authorization is terminated  or revoked sooner.       Influenza A by PCR NEGATIVE NEGATIVE Final   Influenza B by PCR NEGATIVE NEGATIVE Final    Comment: (NOTE) The Xpert Xpress SARS-CoV-2/FLU/RSV plus assay is intended as an aid in the diagnosis of influenza from Nasopharyngeal swab specimens and should not be used as a sole basis for treatment. Nasal washings and aspirates are unacceptable for Xpert Xpress SARS-CoV-2/FLU/RSV testing.  Fact Sheet for Patients: BloggerCourse.com  Fact Sheet for Healthcare Providers: SeriousBroker.it  This test is not  yet approved or cleared by the [place] FDA and has been authorized for detection and/or diagnosis of SARS-CoV-2 by FDA under an Emergency Use Authorization (EUA). This EUA will remain in effect (meaning this test can be used) for the duration of the COVID-19 declaration under Section 564(b)(1) of the Act, 21 U.S.C. section 360bbb-3(b)(1), unless the authorization is terminated or revoked.  Performed at [facility], [address] [place], [place] [address]   Resp Panel by RT-PCR (Flu A&B, Covid) Nasopharyngeal Swab     Status: None   Collection Time: 01/31/21  7:19 AM   Specimen: Nasopharyngeal Swab; Nasopharyngeal(NP) swabs in vial transport medium  Result Value Ref Range Status  SARS Coronavirus 2 by RT PCR NEGATIVE NEGATIVE Final    Comment: (NOTE) SARS-CoV-2 target nucleic acids are NOT DETECTED.  The SARS-CoV-2 RNA is generally detectable in upper respiratory specimens during the acute phase of infection. The lowest concentration of SARS-CoV-2 viral copies this assay can detect is 138 copies/mL. A negative result does not preclude SARS-Cov-2 infection and should not be used as the sole basis for treatment or other patient management decisions. A negative result may occur with  improper specimen collection/handling, submission of specimen other than nasopharyngeal swab, presence of viral mutation(s) within the areas targeted by this assay, and inadequate number of viral copies(<138 copies/mL). A negative result must be combined with clinical observations, patient history, and epidemiological information. The expected result is Negative.  Fact Sheet for Patients:  BloggerCourse.com  Fact Sheet for Healthcare Providers:  SeriousBroker.it  This test is no t yet approved or cleared by the [place] FDA and  has been authorized for detection and/or diagnosis of SARS-CoV-2 by FDA under an Emergency Use  Authorization (EUA). This EUA will remain  in effect (meaning this test can be used) for the duration of the COVID-19 declaration under Section 564(b)(1) of the Act, 21 U.S.C.section 360bbb-3(b)(1), unless the authorization is terminated  or revoked sooner.       Influenza A by PCR NEGATIVE NEGATIVE Final   Influenza B by PCR NEGATIVE NEGATIVE Final    Comment: (NOTE) The Xpert Xpress SARS-CoV-2/FLU/RSV plus assay is intended as an aid in the diagnosis of influenza from Nasopharyngeal swab specimens and should not be used as a sole basis for treatment. Nasal washings and aspirates are unacceptable for Xpert Xpress SARS-CoV-2/FLU/RSV testing.  Fact Sheet for Patients: BloggerCourse.com  Fact Sheet for Healthcare Providers: SeriousBroker.it  This test is not yet approved or cleared by the [place] FDA and has been authorized for detection and/or diagnosis of SARS-CoV-2 by FDA under an Emergency Use Authorization (EUA). This EUA will remain in effect (meaning this test can be used) for the duration of the COVID-19 declaration under Section 564(b)(1) of the Act, 21 U.S.C. section 360bbb-3(b)(1), unless the authorization is terminated or revoked.  Performed at [facility], [address] [place], [place] [address]     RADIOLOGY STUDIES/RESULTS: MR TIBIA FIBULA LEFT W WO CONTRAST  Result Date: 02/01/2021 CLINICAL DATA:  Lower leg pain, stress fracture suspected, neg xray Recent COVID with unexplained left leg pain-Doppler x2 negative, x-ray negative for any bony abnormalities. EXAM: MRI OF LOWER LEFT EXTREMITY WITHOUT AND WITH CONTRAST TECHNIQUE: Multiplanar, multisequence MR imaging of the left tibia and fibula was performed both before and after administration of intravenous contrast. CONTRAST:  9.79mL GADAVIST GADOBUTROL 1 MMOL/ML IV SOLN COMPARISON:  X-ray 01/29/2021 FINDINGS: Bones/Joint/Cartilage Patchy bone marrow  edema in the proximal left tibial metadiaphysis in a somewhat serpiginous distribution (series 11, images 17-18). Subtle intermediate T1 marrow signal at this location without marrow replacement. No associated cortical thickening. No periostitis. Similar, although slightly less prominent changes are seen within the contralateral right tibia on large field-of-view coronal imaging. No fracture.  No malalignment.  No erosion or marrow replacement. Ligaments No acute abnormality. Muscles and Tendons Normal muscle bulk and signal intensity without edema, atrophy, or fatty infiltration. The included tendinous structures of the lower leg are intact. Soft tissues No soft tissue edema or fluid collection. No solid or cystic mass. No abnormal postcontrast enhancement. IMPRESSION: 1. Patchy bone marrow edema in the proximal left tibial metadiaphysis in a somewhat serpiginous  distribution. Findings are nonspecific and could reflect developing bone infarction. Red marrow reconversion could also result in this appearance. Stress-related changes are felt to be less likely given the distribution and lack of cortical and periosteal changes. 2. Similar, although slightly less prominent changes are seen within the contralateral right tibia on large field-of-view coronal imaging. 3. Otherwise unremarkable pre and post-contrast MRI of the left lower leg. Electronically Signed   By: Duanne Guess D.O.   On: 02/01/2021 08:07   VAS [place] LOWER EXTREMITY VENOUS (DVT)  Result Date: 01/31/2021  Lower Venous DVT Study Patient Name:  ELWYN LOWDEN  Date of Exam:   01/31/2021 Medical Rec #: [id number]       Accession #:    [id number] Date of Birth: 01-12-1961       Patient Gender: F Patient Age:   64 years Exam Location:  [facility] Procedure:      VAS [place] LOWER EXTREMITY VENOUS (DVT) Referring Phys: Jeoffrey Massed --------------------------------------------------------------------------------  Indications: Left lateral shin pain.  Recent Covid infection.  Comparison Study: Prior negative left LEV done 01/26/21 Performing Technologist: Sherren Kerns RVS  Examination Guidelines: A complete evaluation includes B-mode imaging, spectral Doppler, color Doppler, and power Doppler as needed of all accessible portions of each vessel. Bilateral testing is considered an integral part of a complete examination. Limited examinations for reoccurring indications may be performed as noted. The reflux portion of the exam is performed with the patient in reverse Trendelenburg.  +-----+---------------+---------+-----------+----------+--------------+ RIGHTCompressibilityPhasicitySpontaneityPropertiesThrombus Aging +-----+---------------+---------+-----------+----------+--------------+ CFV  Full           Yes      Yes                                 +-----+---------------+---------+-----------+----------+--------------+   +---------+---------------+---------+-----------+----------+--------------+ LEFT     CompressibilityPhasicitySpontaneityPropertiesThrombus Aging +---------+---------------+---------+-----------+----------+--------------+ CFV      Full           Yes      Yes                                 +---------+---------------+---------+-----------+----------+--------------+ SFJ      Full                                                        +---------+---------------+---------+-----------+----------+--------------+ FV Prox  Full                                                        +---------+---------------+---------+-----------+----------+--------------+ FV Mid   Full                                                        +---------+---------------+---------+-----------+----------+--------------+ FV DistalFull                                                        +---------+---------------+---------+-----------+----------+--------------+  PFV      Full                                                         +---------+---------------+---------+-----------+----------+--------------+ POP      Full           Yes      Yes                                 +---------+---------------+---------+-----------+----------+--------------+ PTV      Full                                                        +---------+---------------+---------+-----------+----------+--------------+ PERO     Full                                                        +---------+---------------+---------+-----------+----------+--------------+ Incidentally, patient has normal arterial waveforms throughout the left lower extremity and into the left foot.    Summary: RIGHT: - No evidence of common femoral vein obstruction.  LEFT: - There is no evidence of deep vein thrombosis in the lower extremity.  - Ultrasound characteristics of enlarged lymph nodes noted in the groin.  *See table(s) above for measurements and observations. Electronically signed by Sherald Hess MD on 01/31/2021 at 3:44:24 PM.    Final      LOS: 3 days   Jeoffrey Massed, MD  [facility]    To contact the attending provider between 7A-7P or the covering provider during after hours 7P-7A, please log into the web site [email or web address] and access using universal [place] password for that web site. If you do not have the password, please call the hospital operator.  02/02/2021, 11:49 AM

## 2021-02-03 ENCOUNTER — Other Ambulatory Visit (HOSPITAL_COMMUNITY): Payer: Self-pay

## 2021-02-03 LAB — BASIC METABOLIC PANEL
Anion gap: 7 (ref 5–15)
BUN: 10 mg/dL (ref 8–23)
CO2: 22 mmol/L (ref 22–32)
Calcium: 8.9 mg/dL (ref 8.9–10.3)
Chloride: 106 mmol/L (ref 98–111)
Creatinine, Ser: 0.78 mg/dL (ref 0.44–1.00)
GFR, Estimated: >60 mL/min (ref >60)
Glucose, Bld: 109 mg/dL — ABNORMAL HIGH (ref 70–99)
Potassium: 3.6 mmol/L (ref 3.5–5.1)
Sodium: 135 mmol/L (ref 135–145)

## 2021-02-03 LAB — MAGNESIUM: Magnesium: 2.1 mg/dL (ref 1.7–2.4)

## 2021-02-03 MED ORDER — ASPIRIN 325 MG PO TBEC
325.0000 mg | DELAYED_RELEASE_TABLET | Freq: Every day | ORAL | 0 refills | Status: DC
  Filled 2021-02-03: qty 30, 30d supply, fill #0

## 2021-02-03 MED ORDER — ACETAMINOPHEN 500 MG PO TABS
1000.0000 mg | ORAL_TABLET | Freq: Three times a day (TID) | ORAL | 0 refills | Status: DC | PRN
  Filled 2021-02-03: qty 30, 5d supply, fill #0

## 2021-02-03 MED ORDER — GABAPENTIN 400 MG PO CAPS
400.0000 mg | ORAL_CAPSULE | Freq: Three times a day (TID) | ORAL | Status: DC
  Administered 2021-02-03: 400 mg via ORAL
  Filled 2021-02-03: qty 1

## 2021-02-03 MED ORDER — GABAPENTIN 300 MG PO CAPS
300.0000 mg | ORAL_CAPSULE | Freq: Three times a day (TID) | ORAL | 1 refills | Status: DC
  Filled 2021-02-03: qty 90, 30d supply, fill #0

## 2021-02-03 MED ORDER — OXYCODONE HCL 5 MG PO TABS
5.0000 mg | ORAL_TABLET | Freq: Four times a day (QID) | ORAL | 0 refills | Status: DC | PRN
  Filled 2021-02-03: qty 20, 5d supply, fill #0

## 2021-02-03 MED ORDER — METHOCARBAMOL 500 MG PO TABS
500.0000 mg | ORAL_TABLET | Freq: Three times a day (TID) | ORAL | 0 refills | Status: DC | PRN
  Filled 2021-02-03: qty 30, 10d supply, fill #0

## 2021-02-03 NOTE — Discharge Summary (Signed)
PATIENT DETAILS Name: Jessica Cooke Age: 60 y.o. Sex: female Date of Birth: 1960/05/11 MRN: [id number]. Admitting Physician: Maretta Bees, MD WUJ:[id number], Greggory Stallion, MD  Admit Date: 01/29/2021 Discharge date: 02/03/2021  Recommendations for Outpatient Follow-up:  Follow up with PCP in 1-2 weeks Please obtain CMP/CBC in one week Please do repeat MRI of left leg in 3 to 4 weeks Repeat MRI abdomen in 1 year to assess for lymphatic malformation  Admitted From:  Home  Disposition: Home   Home Health: No  Equipment/Devices: None  Discharge Condition: Stable  CODE STATUS: FULL CODE  Diet recommendation:  Diet Order             Diet general           Diet regular Room service appropriate? Yes; Fluid consistency: Thin  Diet effective now                    Brief Summary: Patient is a 60 y.o. female with history of HTN, HLD, hypothyroidism-diagnosed with COVID-19 on 10/7 (numerous family members positive as well)-presenting to the ED on 10/14 with severe left lower extremity pain (similar ED visit on 10/11-Doppler negative for DVT)  Pertinent labs/Radiology: 10/11>> left lower extremity Doppler: No DVT 10/14>> MRI lumbar spine: Mild degenerative changes but no acute findings. 10/15>> CTA chest: No PE, bibasilar atelectatic changes. 10/16>> left lower extremity Doppler: No DVT. 10/16>> MRI left tibia/fibula: Patchy bone marrow edema in proximal left tibia-?  Bone infarct.     10/7>> COVID PCR: Positive with CT value 22.7 10/15>> COVID PCR: Negative 10/16>> COVID PCR: Negative    Brief Hospital Course: COVID-19 infection: Mild cough-but otherwise asymptomatic.  Doubt any isolation required-she was initially diagnosed on 10/7-but has had to repeat PCR test negative..     Left lower extremity pain: Unclear etiology-no swelling/erythema evident on exam.  Doppler studies x2 negative for DVT, foot warm-dorsalis pedis pulse easily palpable (per vascular  tech-all arterial waveforms/flow normal).  MRI lumbar spine negative for any significant spinal stenosis.  MRI left leg done on 10/16-shows possible developing bone infarct-Case was discussed with orthopedics on-call-recommendations for supportive care/pain management/mobilization and weightbearing as tolerated.  Pain has markedly improved over the past few days-continue Neurontin-use Tylenol and NSAIDs for breakthrough pain-use as needed oxycodone for intractable pain in spite of using Tylenol/Motrin.  Have asked patient to follow-up with PCP in consider repeating an MRI of the left leg in a few weeks to for other evaluate.   Hypokalemia: Repleted.   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   Tobacco abuse: Continue transdermal nicotine   Obesity: Estimated body mass index is 30.15 kg/m as calculated from the following:   Height as of this encounter:  (1.753 m).   Weight as of this encounter: 92.6 kg.    Procedures None  Discharge Diagnoses:  Principal Problem:   COVID-19 virus infection Active Problems:   Hypothyroidism   Acute respiratory failure with hypoxia (HCC)   Essential hypertension   Mixed hyperlipidemia   Tobacco abuse   Discharge Instructions:  Activity:  As tolerated with Full fall precautions use walker/cane & assistance as needed   Discharge Instructions     Call MD for:  redness, tenderness, or signs of infection (pain, swelling, redness, odor or green/yellow discharge around incision site)   Complete by: As directed    Diet  general   Complete by: As directed    Discharge instructions   Complete by: As directed    Follow with Primary MD  Jackie Plum, MD in 1-2 weeks  Repeat MRI of the left leg and 2-3 weeks  Incidental finding-you were found to have a lymphatic malformation in the  retroperitoneal area on MRI abdomen.  Radiology is recommending that you get a repeat MRI of the abdomen in 1 year.  Please ask your primary care practitioner about this.  Please get a complete blood count and chemistry panel checked by your Primary MD at your next visit, and again as instructed by your Primary MD.  Get Medicines reviewed and adjusted: Please take all your medications with you for your next visit with your Primary MD  Laboratory/radiological data: Please request your Primary MD to go over all hospital tests and procedure/radiological results at the follow up, please ask your Primary MD to get all Hospital records sent to his/her office.  In some cases, they will be blood work, cultures and biopsy results pending at the time of your discharge. Please request that your primary care M.D. follows up on these results.  Also Note the following: If you experience worsening of your admission symptoms, develop shortness of breath, life threatening emergency, suicidal or homicidal thoughts you must seek medical attention immediately by calling 911 or calling your MD immediately  if symptoms less severe.  You must read complete instructions/literature along with all the possible adverse reactions/side effects for all the Medicines you take and that have been prescribed to you. Take any new Medicines after you have completely understood and accpet all the possible adverse reactions/side effects.   Do not drive when taking Pain medications or sleeping medications (Benzodaizepines)  Do not take more than prescribed Pain, Sleep and Anxiety Medications. It is not advisable to combine anxiety,sleep and pain medications without talking with your primary care practitioner  Special Instructions: If you have smoked or chewed Tobacco  in the last 2 yrs please stop smoking, stop any regular Alcohol  and or any Recreational drug use.  Wear Seat belts while driving.  Please note: You were cared for  by a hospitalist during your hospital stay. Once you are discharged, your primary care physician will handle any further medical issues. Please note that NO REFILLS for any discharge medications will be authorized once you are discharged, as it is imperative that you return to your primary care physician (or establish a relationship with a primary care physician if you do not have one) for your post hospital discharge needs so that they can reassess your need for medications and monitor your lab values.   Increase activity slowly   Complete by: As directed       Allergies as of 02/03/2021       Reactions   Dilaudid [hydromorphone Hcl] Nausea And Vomiting   Tramadol Nausea And Vomiting        Medication List     STOP taking these medications    nicotine 21 mg/24hr patch Commonly known as: Nicoderm CQ   oxyCODONE-acetaminophen 5-325 MG tablet Commonly known as: PERCOCET/ROXICET   PAXLOVID PO       TAKE these medications    acetaminophen 500 MG tablet Commonly known as: TYLENOL Take 2 tablets (1,000 mg total) by mouth every 8 (eight) hours as needed for mild pain.   aspirin 325 MG EC tablet Take 1 tablet (325 mg total) by mouth daily. Start taking on: February 04, 2021   calcium gluconate 500 MG tablet Take 1 tablet (500 mg total) by mouth 2 (two) times daily.   gabapentin 300 MG capsule Commonly known as: NEURONTIN Take 1 capsule (300 mg total) by mouth 3 (three) times daily.   ibuprofen 200 MG tablet Commonly known as: ADVIL Take 800 mg by mouth every 6 (six) hours as needed for headache or moderate pain.   levothyroxine 100 MCG tablet Commonly known as: SYNTHROID TAKE 1 TABLET(100 MCG) BY MOUTH DAILY BEFORE BREAKFAST What changed: See the new instructions.   methocarbamol 500 MG tablet Commonly known as: ROBAXIN Take 1 tablet (500 mg total) by mouth every 8 (eight) hours as needed for muscle spasms. What changed: how much to take   oxyCODONE 5 MG immediate  release tablet Commonly known as: Oxy IR/ROXICODONE Take 1 tablet (5 mg total) by mouth every 6 (six) hours as needed for moderate pain.        Follow-up Information     Osei-Bonsu, Greggory Stallion, MD. Schedule an appointment as soon as possible for a visit in 1 week(s).   Specialty: Internal Medicine Contact information: [address] [place] [address] [phone]                Allergies  Allergen Reactions   Dilaudid [Hydromorphone Hcl] Nausea And Vomiting   Tramadol Nausea And Vomiting      Consultations: None   Other Procedures/Studies: DG Chest 2 View  Result Date: 01/29/2021 CLINICAL DATA:  Hypoxia EXAM: CHEST - 2 VIEW COMPARISON:  01/22/2021 FINDINGS: Cardiac shadow is stable. Lungs are well aerated bilaterally. Small posterior effusions are noted. Additionally there is focal infiltrate in what appears to be the right lower lobe posteriorly. No bony abnormality is seen. IMPRESSION: Right lower lobe infiltrate and small associated pleural effusions bilaterally. Electronically Signed   By: Alcide Clever M.D.   On: 01/29/2021 22:02   DG Chest 2 View  Result Date: 01/22/2021 CLINICAL DATA:  60 year old female with chest pain. Epigastric pain, nausea vomiting and shortness of breath for 3 days. Smoker. EXAM: CHEST - 2 VIEW COMPARISON:  Chest radiographs 05/23/2018 and earlier. FINDINGS: Chronic somewhat large lung volumes and chronic increased interstitial markings in both lungs. Stable cardiac size at the upper limits of normal. Stable tortuosity of the thoracic aorta. Other mediastinal contours are within normal limits. Visualized tracheal air column is within normal limits. No pneumothorax or acute pulmonary opacity. No acute osseous abnormality identified. Negative visible bowel gas. IMPRESSION: No acute cardiopulmonary abnormality. Electronically Signed   By: Odessa Fleming M.D.   On: 01/22/2021 11:08   DG Tibia/Fibula Left  Result Date: 01/29/2021 CLINICAL  DATA:  Lower left leg pain. EXAM: LEFT TIBIA AND FIBULA - 2 VIEW COMPARISON:  March 31, 2018 FINDINGS: There is no evidence of fracture or other focal bone lesions. Soft tissues are unremarkable. IMPRESSION: Negative. Electronically Signed   By: Aram Candela M.D.   On: 01/29/2021 20:46   CT Angio Chest PE W and/or Wo Contrast  Result Date: 01/30/2021 CLINICAL DATA:  Hypoxia EXAM: CT ANGIOGRAPHY CHEST WITH CONTRAST TECHNIQUE: Multidetector CT imaging of the chest was performed using the standard protocol during bolus administration of intravenous contrast. Multiplanar CT image reconstructions and MIPs were obtained to evaluate the vascular anatomy. CONTRAST:  75mL OMNIPAQUE IOHEXOL 350 MG/ML SOLN COMPARISON:  Chest x-ray from the previous day. FINDINGS: Cardiovascular: Thoracic aorta and its branches are well visualized without evidence of aneurysmal dilatation or dissection. Mild atherosclerotic  calcifications are seen. Coronary calcifications are noted. No significant cardiac enlargement is seen. The pulmonary artery shows a normal branching pattern bilaterally. No filling defect to suggest pulmonary embolism is noted. Mediastinum/Nodes: Thoracic inlet is within normal limits. No sizable hilar or mediastinal adenopathy is noted. The esophagus as visualized is within normal limits. Lungs/Pleura: Lungs demonstrate emphysematous change. Bilateral lower lobe atelectatic changes are noted with minimal pleural effusions similar to that seen on prior plain film examination. No sizable parenchymal nodule is noted. No focal confluent infiltrate is seen. Upper Abdomen: Visualized upper abdomen shows no acute abnormality. Musculoskeletal: Degenerative changes of the thoracic spine are seen. No compression deformity is noted. No definitive rib fracture abnormality is noted. Review of the MIP images confirms the above findings. IMPRESSION: No evidence of pulmonary emboli. Bibasilar atelectatic changes with small  effusions. Aortic Atherosclerosis (ICD10-I70.0) and Emphysema (ICD10-J43.9). Electronically Signed   By: Alcide Clever M.D.   On: 01/30/2021 00:13   MR LUMBAR SPINE WO CONTRAST  Result Date: 01/29/2021 CLINICAL DATA:  low back pain, progressive neurologic deficit EXAM: MRI LUMBAR SPINE WITHOUT CONTRAST TECHNIQUE: Multiplanar, multisequence MR imaging of the lumbar spine was performed. No intravenous contrast was administered. COMPARISON:  X-ray 11/06/2019, MRI 12/19/2019 FINDINGS: Segmentation:  Standard. Alignment:  Physiologic. Vertebrae: No acute fracture. No evidence of discitis. Diffuse marrow heterogeneity without discrete marrow replacing lesion. Findings are nonspecific but can be seen in the setting of chronic anemia, smoking, and/or obesity. Conus medullaris and cauda equina: Conus extends to the L2 level. Conus and cauda equina appear normal. 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 This appears increased in size compared to the previous MRI. Disc levels: T12-L1: Unremarkable. L1-L2: Unremarkable. L2-L3: Unremarkable. L3-L4: Mild disc desiccation and minimal annular disc bulge. Mild bilateral facet arthropathy. No foraminal or canal stenosis. Unchanged. L4-L5: Disc desiccation and mild annular disc bulge. Moderate bilateral facet arthropathy with small bilateral facet joint effusions. No foraminal or canal stenosis. Unchanged. L5-S1: Disc height loss with diffuse disc bulge. Mild bilateral facet arthropathy. Mild-to-moderate left and mild right foraminal stenosis. No canal stenosis. Findings may be minimally progressed compared to the previous study. IMPRESSION: 1. Mild degenerative changes of the lumbar spine greatest at L5-S1 where there is  mild-to-moderate left and mild right foraminal stenosis. 2. No canal stenosis at any level. 3. 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 This appears increased in size compared to the previous MRI. Findings are most likely incidental and of doubtful clinical significance. If clinically desired, a follow-up MRI of the abdomen with contrast could be obtained in 1 year to assess stability. Left 4. Diffuse marrow heterogeneity without discrete marrow replacing lesion. Findings are nonspecific but can be seen in the setting of chronic anemia, smoking, and/or obesity. Electronically Signed   By: Duanne Guess D.O.   On: 01/29/2021 18:17   MR TIBIA FIBULA LEFT W WO CONTRAST  Result Date: 02/01/2021 CLINICAL DATA:  Lower leg pain, stress fracture suspected, neg xray Recent COVID with unexplained left leg pain-Doppler x2 negative, x-ray negative for any bony abnormalities. EXAM: MRI OF LOWER LEFT EXTREMITY WITHOUT AND WITH CONTRAST TECHNIQUE: Multiplanar, multisequence MR imaging of the left  tibia and fibula was performed both before and after administration of intravenous contrast. CONTRAST:  9.66mL GADAVIST GADOBUTROL 1 MMOL/ML IV SOLN COMPARISON:  X-ray 01/29/2021 FINDINGS: Bones/Joint/Cartilage Patchy bone marrow edema in the proximal left tibial metadiaphysis in a somewhat serpiginous distribution (series 11, images 17-18). Subtle intermediate T1 marrow signal at this location without marrow replacement. No associated cortical thickening. No periostitis. Similar, although slightly less prominent changes are seen within the contralateral right tibia on large field-of-view coronal imaging. No fracture.  No malalignment.  No erosion or marrow replacement. Ligaments No acute abnormality. Muscles and Tendons Normal muscle bulk and signal intensity without edema, atrophy,  or fatty infiltration. The included tendinous structures of the lower leg are intact. Soft tissues No soft tissue edema or fluid collection. No solid or cystic mass. No abnormal postcontrast enhancement. IMPRESSION: 1. Patchy bone marrow edema in the proximal left tibial metadiaphysis in a somewhat serpiginous distribution. Findings are nonspecific and could reflect developing bone infarction. Red marrow reconversion could also result in this appearance. Stress-related changes are felt to be less likely given the distribution and lack of cortical and periosteal changes. 2. Similar, although slightly less prominent changes are seen within the contralateral right tibia on large field-of-view coronal imaging. 3. Otherwise unremarkable pre and post-contrast MRI of the left lower leg. Electronically Signed   By: Duanne Guess D.O.   On: 02/01/2021 08:07   US Venous Img Lower Unilateral Left  Result Date: 01/26/2021 CLINICAL DATA:  Swelling. Left lower extremity pain. COVID positive EXAM: Left LOWER EXTREMITY VENOUS DOPPLER ULTRASOUND TECHNIQUE: Gray-scale sonography with compression, as well as color and duplex ultrasound, were performed to evaluate the deep venous system(s) from the level of the common femoral vein through the popliteal and proximal calf veins. COMPARISON:  None. FINDINGS: VENOUS Normal compressibility of the common femoral, superficial femoral, and popliteal veins, as well as the visualized calf veins. Visualized portions of profunda femoral vein and great saphenous vein unremarkable. No filling defects to suggest DVT on grayscale or color Doppler imaging. Doppler waveforms show normal direction of venous flow, normal respiratory plasticity and response to augmentation. Limited views of the contralateral common femoral vein are unremarkable. OTHER None. Limitations: none IMPRESSION: No deep venous thrombosis of the left lower extremity. Electronically Signed   By: Tish Frederickson M.D.   On:  01/26/2021 20:19   VAS [place] LOWER EXTREMITY VENOUS (DVT)  Result Date: 01/31/2021  Lower Venous DVT Study Patient Name:  Jessica Cooke  Date of Exam:   01/31/2021 Medical Rec #: [id number]       Accession #:    [id number] Date of Birth: 03/27/1961       Patient Gender: F Patient Age:   89 years Exam Location:  [facility] Procedure:      VAS [place] LOWER EXTREMITY VENOUS (DVT) Referring Phys: Jeoffrey Massed --------------------------------------------------------------------------------  Indications: Left lateral shin pain. Recent Covid infection.  Comparison Study: Prior negative left LEV done 01/26/21 Performing Technologist: Sherren Kerns RVS  Examination Guidelines: A complete evaluation includes B-mode imaging, spectral Doppler, color Doppler, and power Doppler as needed of all accessible portions of each vessel. Bilateral testing is considered an integral part of a complete examination. Limited examinations for reoccurring indications may be performed as noted. The reflux portion of the exam is performed with the patient in reverse Trendelenburg.  +-----+---------------+---------+-----------+----------+--------------+ RIGHTCompressibilityPhasicitySpontaneityPropertiesThrombus Aging +-----+---------------+---------+-----------+----------+--------------+ CFV  Full           Yes      Yes                                 +-----+---------------+---------+-----------+----------+--------------+   +---------+---------------+---------+-----------+----------+--------------+  LEFT     CompressibilityPhasicitySpontaneityPropertiesThrombus Aging +---------+---------------+---------+-----------+----------+--------------+ CFV      Full           Yes      Yes                                 +---------+---------------+---------+-----------+----------+--------------+ SFJ      Full                                                         +---------+---------------+---------+-----------+----------+--------------+ FV Prox  Full                                                        +---------+---------------+---------+-----------+----------+--------------+ FV Mid   Full                                                        +---------+---------------+---------+-----------+----------+--------------+ FV DistalFull                                                        +---------+---------------+---------+-----------+----------+--------------+ PFV      Full                                                        +---------+---------------+---------+-----------+----------+--------------+ POP      Full           Yes      Yes                                 +---------+---------------+---------+-----------+----------+--------------+ PTV      Full                                                        +---------+---------------+---------+-----------+----------+--------------+ PERO     Full                                                        +---------+---------------+---------+-----------+----------+--------------+ Incidentally, patient has normal arterial waveforms throughout the left lower extremity and into the left foot.    Summary: RIGHT: - No evidence of common femoral vein obstruction.  LEFT: - There is no evidence of deep vein thrombosis in the lower extremity.  - Ultrasound  characteristics of enlarged lymph nodes noted in the groin.  *See table(s) above for measurements and observations. Electronically signed by Sherald Hess MD on 01/31/2021 at 3:44:24 PM.    Final      TODAY-DAY OF DISCHARGE:  Subjective:   Jessica Cooke today has no headache,no chest abdominal pain,no new weakness tingling or numbness, feels much better wants to go home today.   Objective:   Blood pressure 110/76, pulse 70, temperature 97.7 F (36.5 C), temperature source Oral, resp. rate 16, height 5\' 9"  (1.753  m), weight 92.6 kg, SpO2 95 %.  Intake/Output Summary (Last 24 hours) at 02/03/2021 1044 Last data filed at 02/03/2021 0244 Gross per 24 hour  Intake 240 ml  Output 1350 ml  Net -1110 ml   Filed Weights   01/29/21 1154 01/30/21 1608  Weight: 98.9 kg 92.6 kg    Exam: Awake Alert, Oriented *3, No new F.N deficits, Normal affect [place].AT,PERRAL Supple Neck,No JVD, No cervical lymphadenopathy appriciated.  Symmetrical Chest wall movement, Good air movement bilaterally, CTAB RRR,No Gallops,Rubs or new Murmurs, No Parasternal Heave +ve B.Sounds, Abd Soft, Non tender, No organomegaly appriciated, No rebound -guarding or rigidity. No Cyanosis, Clubbing or edema, No new Rash or bruise   PERTINENT RADIOLOGIC STUDIES: No results found.   PERTINENT LAB RESULTS: CBC: No results for input(s): WBC, HGB, HCT, PLT in the last 72 hours. CMET CMP     Component Value Date/Time   NA 135 02/03/2021 0227   NA 142 01/27/2020 1541   K 3.6 02/03/2021 0227   CL 106 02/03/2021 0227   CO2 22 02/03/2021 0227   GLUCOSE 109 (H) 02/03/2021 0227   BUN 10 02/03/2021 0227   BUN 8 01/27/2020 1541   CREATININE 0.78 02/03/2021 0227   CALCIUM 8.9 02/03/2021 0227   PROT 6.0 (L) 01/31/2021 0206   ALBUMIN 3.7 01/31/2021 0206   AST 15 01/31/2021 0206   ALT 14 01/31/2021 0206   ALKPHOS 37 (L) 01/31/2021 0206   BILITOT 0.6 01/31/2021 0206   GFRNONAA >60 02/03/2021 0227   GFRAA 87 01/27/2020 1541    GFR Estimated Creatinine Clearance: 88.4 mL/min (by C-G formula based on SCr of 0.78 mg/dL). No results for input(s): LIPASE, AMYLASE in the last 72 hours. Recent Labs    02/01/21 0128  CKTOTAL 62   Invalid input(s): POCBNP No results for input(s): DDIMER in the last 72 hours. No results for input(s): HGBA1C in the last 72 hours. No results for input(s): CHOL, HDL, LDLCALC, TRIG, CHOLHDL, LDLDIRECT in the last 72 hours. No results for input(s): TSH, T4TOTAL, T3FREE, THYROIDAB in the last 72  hours.  Invalid input(s): FREET3 No results for input(s): VITAMINB12, FOLATE, FERRITIN, TIBC, IRON, RETICCTPCT in the last 72 hours. Coags: No results for input(s): INR in the last 72 hours.  Invalid input(s): PT Microbiology: Recent Results (from the past 240 hour(s))  Resp Panel by RT-PCR (Flu A&B, Covid) Nasopharyngeal Swab     Status: None   Collection Time: 01/30/21 12:36 AM   Specimen: Nasopharyngeal Swab; Nasopharyngeal(NP) swabs in vial transport medium  Result Value Ref Range Status   SARS Coronavirus 2 by RT PCR NEGATIVE NEGATIVE Final    Comment: (NOTE) SARS-CoV-2 target nucleic acids are NOT DETECTED.  The SARS-CoV-2 RNA is generally detectable in upper respiratory specimens during the acute phase of infection. The lowest concentration of SARS-CoV-2 viral copies this assay can detect is 138 copies/mL. A negative result does not preclude SARS-Cov-2 infection and should not be used as  the sole basis for treatment or other patient management decisions. A negative result may occur with  improper specimen collection/handling, submission of specimen other than nasopharyngeal swab, presence of viral mutation(s) within the areas targeted by this assay, and inadequate number of viral copies(<138 copies/mL). A negative result must be combined with clinical observations, patient history, and epidemiological information. The expected result is Negative.  Fact Sheet for Patients:  BloggerCourse.com  Fact Sheet for Healthcare Providers:  SeriousBroker.it  This test is no t yet approved or cleared by the [place] FDA and  has been authorized for detection and/or diagnosis of SARS-CoV-2 by FDA under an Emergency Use Authorization (EUA). This EUA will remain  in effect (meaning this test can be used) for the duration of the COVID-19 declaration under Section 564(b)(1) of the Act, 21 U.S.C.section 360bbb-3(b)(1), unless the  authorization is terminated  or revoked sooner.       Influenza A by PCR NEGATIVE NEGATIVE Final   Influenza B by PCR NEGATIVE NEGATIVE Final    Comment: (NOTE) The Xpert Xpress SARS-CoV-2/FLU/RSV plus assay is intended as an aid in the diagnosis of influenza from Nasopharyngeal swab specimens and should not be used as a sole basis for treatment. Nasal washings and aspirates are unacceptable for Xpert Xpress SARS-CoV-2/FLU/RSV testing.  Fact Sheet for Patients: BloggerCourse.com  Fact Sheet for Healthcare Providers: SeriousBroker.it  This test is not yet approved or cleared by the [place] FDA and has been authorized for detection and/or diagnosis of SARS-CoV-2 by FDA under an Emergency Use Authorization (EUA). This EUA will remain in effect (meaning this test can be used) for the duration of the COVID-19 declaration under Section 564(b)(1) of the Act, 21 U.S.C. section 360bbb-3(b)(1), unless the authorization is terminated or revoked.  Performed at [facility], [address] [place], [place] [address]   Resp Panel by RT-PCR (Flu A&B, Covid) Nasopharyngeal Swab     Status: None   Collection Time: 01/31/21  7:19 AM   Specimen: Nasopharyngeal Swab; Nasopharyngeal(NP) swabs in vial transport medium  Result Value Ref Range Status   SARS Coronavirus 2 by RT PCR NEGATIVE NEGATIVE Final    Comment: (NOTE) SARS-CoV-2 target nucleic acids are NOT DETECTED.  The SARS-CoV-2 RNA is generally detectable in upper respiratory specimens during the acute phase of infection. The lowest concentration of SARS-CoV-2 viral copies this assay can detect is 138 copies/mL. A negative result does not preclude SARS-Cov-2 infection and should not be used as the sole basis for treatment or other patient management decisions. A negative result may occur with  improper specimen collection/handling, submission of specimen other than  nasopharyngeal swab, presence of viral mutation(s) within the areas targeted by this assay, and inadequate number of viral copies(<138 copies/mL). A negative result must be combined with clinical observations, patient history, and epidemiological information. The expected result is Negative.  Fact Sheet for Patients:  BloggerCourse.com  Fact Sheet for Healthcare Providers:  SeriousBroker.it  This test is no t yet approved or cleared by the [place] FDA and  has been authorized for detection and/or diagnosis of SARS-CoV-2 by FDA under an Emergency Use Authorization (EUA). This EUA will remain  in effect (meaning this test can be used) for the duration of the COVID-19 declaration under Section 564(b)(1) of the Act, 21 U.S.C.section 360bbb-3(b)(1), unless the authorization is terminated  or revoked sooner.       Influenza A by PCR NEGATIVE NEGATIVE Final   Influenza B by PCR NEGATIVE NEGATIVE Final  Comment: (NOTE) The Xpert Xpress SARS-CoV-2/FLU/RSV plus assay is intended as an aid in the diagnosis of influenza from Nasopharyngeal swab specimens and should not be used as a sole basis for treatment. Nasal washings and aspirates are unacceptable for Xpert Xpress SARS-CoV-2/FLU/RSV testing.  Fact Sheet for Patients: BloggerCourse.com  Fact Sheet for Healthcare Providers: SeriousBroker.it  This test is not yet approved or cleared by the [place] FDA and has been authorized for detection and/or diagnosis of SARS-CoV-2 by FDA under an Emergency Use Authorization (EUA). This EUA will remain in effect (meaning this test can be used) for the duration of the COVID-19 declaration under Section 564(b)(1) of the Act, 21 U.S.C. section 360bbb-3(b)(1), unless the authorization is terminated or revoked.  Performed at [facility], [address] [place], [place] [address]      FURTHER DISCHARGE INSTRUCTIONS:  Get Medicines reviewed and adjusted: Please take all your medications with you for your next visit with your Primary MD  Laboratory/radiological data: Please request your Primary MD to go over all hospital tests and procedure/radiological results at the follow up, please ask your Primary MD to get all Hospital records sent to his/her office.  In some cases, they will be blood work, cultures and biopsy results pending at the time of your discharge. Please request that your primary care M.D. goes through all the records of your hospital data and follows up on these results.  Also Note the following: If you experience worsening of your admission symptoms, develop shortness of breath, life threatening emergency, suicidal or homicidal thoughts you must seek medical attention immediately by calling 911 or calling your MD immediately  if symptoms less severe.  You must read complete instructions/literature along with all the possible adverse reactions/side effects for all the Medicines you take and that have been prescribed to you. Take any new Medicines after you have completely understood and accpet all the possible adverse reactions/side effects.   Do not drive when taking Pain medications or sleeping medications (Benzodaizepines)  Do not take more than prescribed Pain, Sleep and Anxiety Medications. It is not advisable to combine anxiety,sleep and pain medications without talking with your primary care practitioner  Special Instructions: If you have smoked or chewed Tobacco  in the last 2 yrs please stop smoking, stop any regular Alcohol  and or any Recreational drug use.  Wear Seat belts while driving.  Please note: You were cared for by a hospitalist during your hospital stay. Once you are discharged, your primary care physician will handle any further medical issues. Please note that NO REFILLS for any discharge medications will be authorized once you  are discharged, as it is imperative that you return to your primary care physician (or establish a relationship with a primary care physician if you do not have one) for your post hospital discharge needs so that they can reassess your need for medications and monitor your lab values.  Total Time spent coordinating discharge including counseling, education and face to face time equals 35 minutes.  SignedJeoffrey Massed 02/03/2021 10:44 AM

## 2021-02-03 NOTE — Progress Notes (Signed)
Physical Therapy Treatment Patient Details Name: Jessica Cooke MRN: [id number] DOB: 10-08-1960 Today's Date: 02/03/2021   History of Present Illness 60 yo presented to ER 01/30/21 with c/o left leg pain. Diagnosed with COVID 01/22/21; LE U/S negative for DVT on 01-26-2021;  MRI back did not show any spinal stenosis; repeat LLE doppler 10/16 negative; 10/16>> MRI left tibia/fibula: Patchy bone marrow edema in proximal left tibia-?  Bone infarct.  PMH- HTN, hypothyroidism, hyperlipidemia,    PT Comments    Patient with less pain during standing, ambulating, and even able to ascend/descend steps during session. Most painful continues to be sitting. RN made aware pt reports she has not been able to sit long enough to move her bowels.     Recommendations for follow up therapy are one component of a multi-disciplinary discharge planning process, led by the attending physician.  Recommendations may be updated based on patient status, additional functional criteria and insurance authorization.  Follow Up Recommendations  No PT follow up     Equipment Recommendations  None recommended by PT    Recommendations for Other Services       Precautions / Restrictions Precautions Precautions: Fall Restrictions Weight Bearing Restrictions: No     Mobility  Bed Mobility Overal bed mobility: Modified Independent             General bed mobility comments: incr time    Transfers Overall transfer level: Needs assistance Equipment used: Rolling walker (2 wheeled) Transfers: Sit to/from Stand Sit to Stand: Supervision         General transfer comment: recalled proper sequencing with RW  Ambulation/Gait Ambulation/Gait assistance: Min guard Gait Distance (Feet): 30 Feet Assistive device: Rolling walker (2 wheeled) Gait Pattern/deviations: Step-to pattern;Decreased stride length     General Gait Details: pt recalled sequencing with only a couple of times getting off sequence and  correcting without cues   Stairs Stairs: Yes Stairs assistance: Min assist Stair Management: Two rails;Step to pattern;Forwards (simulated 2 rails with bed rail and HHA) Number of Stairs: 2 General stair comments: vc for initial sequencing and then pt able to return demonstrate.   Wheelchair Mobility    Modified Rankin (Stroke Patients Only)       Balance Overall balance assessment: No apparent balance deficits (not formally assessed)                                          Cognition Arousal/Alertness: Awake/alert Behavior During Therapy: WFL for tasks assessed/performed Overall Cognitive Status: Within Functional Limits for tasks assessed                                        Exercises      General Comments General comments (skin integrity, edema, etc.): pt with increased LLE pain with sitting on BSC (over toilet) and reports sitting is always the worst      Pertinent Vitals/Pain Pain Assessment: 0-10 Pain Score: 5  Pain Location: LLE below knee Pain Descriptors / Indicators: Discomfort Pain Intervention(s): Limited activity within patient's tolerance;Monitored during session    Home Living Family/patient expects to be discharged to:: Private residence Living Arrangements: Spouse/significant other Available Help at Discharge: Family Type of Home: House Home Access: Stairs to enter Entrance Stairs-Rails: Can reach both Home Layout: One level Home Equipment: Environmental consultant -  2 wheels;Bedside commode;Shower seat - built in      Prior Function Level of Independence: Independent          PT Goals (current goals can now be found in the care plan section) Acute Rehab PT Goals Patient Stated Goal: for leg to be less painful PT Goal Formulation: With patient Time For Goal Achievement: 02/16/21 Potential to Achieve Goals: Good Progress towards PT goals: Progressing toward goals    Frequency    Min 4X/week      PT Plan  Current plan remains appropriate    Co-evaluation              AM-PAC PT "6 Clicks" Mobility   Outcome Measure  Help needed turning from your back to your side while in a flat bed without using bedrails?: None Help needed moving from lying on your back to sitting on the side of a flat bed without using bedrails?: None Help needed moving to and from a bed to a chair (including a wheelchair)?: A Little Help needed standing up from a chair using your arms (e.g., wheelchair or bedside chair)?: A Little Help needed to walk in hospital room?: A Little Help needed climbing 3-5 steps with a railing? : A Little 6 Click Score: 20    End of Session Equipment Utilized During Treatment: Gait belt Activity Tolerance: Patient tolerated treatment well Patient left: with call bell/phone within reach;in bed Nurse Communication: Mobility status;Other (comment) (pain with sitting and pt reporting she has not had a bowel movement in 1 week) PT Visit Diagnosis: Difficulty in walking, not elsewhere classified (R26.2);Pain Pain - Right/Left: Left Pain - part of body: Leg     Time: 1610-9604 PT Time Calculation (min) (ACUTE ONLY): 14 min  Charges:  $Gait Training: 8-22 mins                      Jerolyn Center, PT Acute Rehabilitation Services  Pager [phone] Office [phone]    Zena Amos 02/03/2021, 11:54 AM

## 2021-02-03 NOTE — Progress Notes (Signed)
Transitions of care pharmacy delivered medications to room. No assistance needed

## 2021-02-03 NOTE — Progress Notes (Signed)
   02/03/21 1314  AVS Discharge Documentation  AVS Discharge Instructions Including Medications Provided to patient/caregiver  Name of Person Receiving AVS Discharge Instructions Including Medications Jessica Cooke  Name of Clinician That Reviewed AVS Discharge Instructions Including Medications Clarnce Flock, RN

## 2021-05-14 ENCOUNTER — Ambulatory Visit: Payer: Self-pay

## 2021-05-14 NOTE — Telephone Encounter (Signed)
°  Chief Complaint: leg pain Symptoms: leg discomfort at night time only Frequency: several weeks Pertinent Negatives: Patient denies swelling Disposition: [] ED /[] Urgent Care (no appt availability in office) / [] Appointment(In office/virtual)/ []  [facility]/ [] Home Care/ [] Refused Recommended Disposition /[x] [facility]/ []  Follow-up with PCP Additional Notes: Pt states when she was in the hospital in 01/2021 they discovered she has blood clots in her bone marrow and was supposed to have MRI f/up but pt didn't go d/t no insurance but she is now having symptoms that only occur at night time and she is asking about doing MRI. Advised pt for MRI she would have to go to ED or UC but could go to Mobile [facility] on 05/18/21 and be assessed. Pt was provided with location details. Also scheduled NPA for 08/10/21.    Reason for Disposition  [1] MODERATE pain (e.g., interferes with normal activities, limping) AND [2] present > 3 days  Answer Assessment - Initial Assessment Questions 1. ONSET: "When did the pain start?"      Several weeks 2. LOCATION: "Where is the pain located?"      L lower leg 3. PAIN: "How bad is the pain?"    (Scale 1-10; or mild, moderate, severe)   -  MILD (1-3): doesn't interfere with normal activities    -  MODERATE (4-7): interferes with normal activities (e.g., work or school) or awakens from sleep, limping    -  SEVERE (8-10): excruciating pain, unable to do any normal activities, unable to walk     Moderate on at night  6. OTHER SYMPTOMS: "Do you have any other symptoms?" (e.g., chest pain, back pain, breathing difficulty, swelling, rash, fever, numbness, weakness)     Unable cross legs or press on area  Protocols used: Leg Pain-A-AH

## 2021-08-10 ENCOUNTER — Ambulatory Visit: Payer: Self-pay | Attending: Critical Care Medicine | Admitting: Critical Care Medicine

## 2021-08-10 ENCOUNTER — Encounter: Payer: Self-pay | Admitting: Critical Care Medicine

## 2021-08-10 ENCOUNTER — Other Ambulatory Visit: Payer: Self-pay

## 2021-08-10 VITALS — BP 122/81 | HR 90 | Ht 69.0 in | Wt 215.4 lb

## 2021-08-10 DIAGNOSIS — J9601 Acute respiratory failure with hypoxia: Secondary | ICD-10-CM

## 2021-08-10 DIAGNOSIS — I10 Essential (primary) hypertension: Secondary | ICD-10-CM

## 2021-08-10 DIAGNOSIS — Z1211 Encounter for screening for malignant neoplasm of colon: Secondary | ICD-10-CM

## 2021-08-10 DIAGNOSIS — Z8616 Personal history of COVID-19: Secondary | ICD-10-CM | POA: Insufficient documentation

## 2021-08-10 DIAGNOSIS — Z79890 Hormone replacement therapy (postmenopausal): Secondary | ICD-10-CM | POA: Insufficient documentation

## 2021-08-10 DIAGNOSIS — Z1159 Encounter for screening for other viral diseases: Secondary | ICD-10-CM

## 2021-08-10 DIAGNOSIS — Z1231 Encounter for screening mammogram for malignant neoplasm of breast: Secondary | ICD-10-CM

## 2021-08-10 DIAGNOSIS — Z79899 Other long term (current) drug therapy: Secondary | ICD-10-CM | POA: Insufficient documentation

## 2021-08-10 DIAGNOSIS — Z720 Tobacco use: Secondary | ICD-10-CM

## 2021-08-10 DIAGNOSIS — I700 Atherosclerosis of aorta: Secondary | ICD-10-CM

## 2021-08-10 DIAGNOSIS — M4800 Spinal stenosis, site unspecified: Secondary | ICD-10-CM

## 2021-08-10 DIAGNOSIS — M48061 Spinal stenosis, lumbar region without neurogenic claudication: Secondary | ICD-10-CM | POA: Insufficient documentation

## 2021-08-10 DIAGNOSIS — M549 Dorsalgia, unspecified: Secondary | ICD-10-CM | POA: Insufficient documentation

## 2021-08-10 DIAGNOSIS — G8929 Other chronic pain: Secondary | ICD-10-CM | POA: Insufficient documentation

## 2021-08-10 DIAGNOSIS — E782 Mixed hyperlipidemia: Secondary | ICD-10-CM

## 2021-08-10 DIAGNOSIS — E038 Other specified hypothyroidism: Secondary | ICD-10-CM

## 2021-08-10 DIAGNOSIS — E039 Hypothyroidism, unspecified: Secondary | ICD-10-CM | POA: Insufficient documentation

## 2021-08-10 DIAGNOSIS — F17210 Nicotine dependence, cigarettes, uncomplicated: Secondary | ICD-10-CM | POA: Insufficient documentation

## 2021-08-10 MED ORDER — LEVOTHYROXINE SODIUM 100 MCG PO TABS
100.0000 ug | ORAL_TABLET | Freq: Every day | ORAL | 2 refills | Status: DC
  Filled 2021-08-10: qty 90, 90d supply, fill #0
  Filled 2021-12-03 – 2021-12-15 (×2): qty 30, 30d supply, fill #1
  Filled 2022-01-24 – 2022-03-04 (×3): qty 30, 30d supply, fill #2
  Filled 2022-03-30 – 2022-04-08 (×2): qty 30, 30d supply, fill #3
  Filled 2022-05-12: qty 30, 30d supply, fill #4
  Filled 2022-06-22: qty 30, 30d supply, fill #5
  Filled 2022-07-25: qty 30, 30d supply, fill #6

## 2021-08-10 MED ORDER — GABAPENTIN 300 MG PO CAPS
300.0000 mg | ORAL_CAPSULE | Freq: Three times a day (TID) | ORAL | 1 refills | Status: DC
  Filled 2021-08-10: qty 90, 30d supply, fill #0

## 2021-08-10 MED ORDER — NICOTINE 21 MG/24HR TD PT24
21.0000 mg | MEDICATED_PATCH | Freq: Every day | TRANSDERMAL | 0 refills | Status: DC
  Filled 2021-08-10: qty 28, 28d supply, fill #0

## 2021-08-10 MED ORDER — METHOCARBAMOL 500 MG PO TABS
500.0000 mg | ORAL_TABLET | Freq: Three times a day (TID) | ORAL | 1 refills | Status: DC | PRN
  Filled 2021-08-10: qty 60, 20d supply, fill #0

## 2021-08-10 NOTE — Assessment & Plan Note (Signed)
Assess lipid panel 

## 2021-08-10 NOTE — Assessment & Plan Note (Signed)
We will reassess thyroid function and refill levothyroxine ?

## 2021-08-10 NOTE — Progress Notes (Signed)
? ?New Patient Office Visit ? ?Subjective   ? ?Patient ID: Jessica Cooke, female    DOB: 04-Nov-1960  Age: 61 y.o. MRN: [id number] ? ?CC:  ?Chief Complaint  ?Patient presents with  ? New Patient (Initial Visit)  ? ? ?HPI ?Jessica Cooke presents to establish care ?This patient previously was seen in this clinic in 2021 and then saw Dr. Julio Sicks previously.  Patient with history of chronic low back pain recent imaging in 2021 showed L4-L5 nerve impingement and she was seen by [facility] and they gave her spinal injections with some relief.  She states the pain is now worsening again and wishes this reassessed.  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  During that hospitalization imaging of the abdomen showed lymphadenopathy in the retroperitoneal area.  The patient is recovered from this.  On arrival blood pressure 122/81.  She is smoking now 1-1/2 pack a day of cigarettes she quit January now smoking again because of stress.  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  She is also had some chest congestion and coughing up some minimal mucus for the last several weeks with the smoking.  She does need medication refills she has hypothyroidism ? ? ?This patient's had total hysterectomy does not need a Pap smear ? ?Patient does need mammogram and colon screening ? ?01/2021 Hosp DC ?Admit Date: 01/29/2021 ?Discharge date: 02/03/2021 ?  ?Recommendations for Outpatient Follow-up:  ?Follow up with PCP in 1-2 weeks ?Please obtain CMP/CBC in one week ?Please do repeat MRI of left leg in 3 to 4 weeks ?Repeat MRI abdomen in 1 year to assess for lymphatic malformation ?  ?Admitted From:  ?Home ?  ?Disposition: ?Home ?  ?Home Health: ?No ?  ?Equipment/Devices: ?None ?  ?Discharge Condition: ?Stable ?  ?CODE STATUS: ?FULL CODE ?  ?Diet recommendation:  ?Diet Order   ?  ?         ?     Diet general       ?  ?    Diet regular Room service appropriate? Yes; Fluid consistency: Thin  Diet effective now       ?  ?  ?   ?  ?  ?   ?  ?  ?Brief Summary: ?Patient is a 61 y.o. female with history of HTN, HLD, hypothyroidism-diagnosed with COVID-19 on 10/7 (numerous family members positive as well)-presenting to the ED on 10/14 with severe left lower extremity pain (similar ED visit on 10/11-Doppler negative for DVT) ?  ?Pertinent labs/Radiology: ?10/11>> left lower extremity Doppler: No DVT ?10/14>> MRI lumbar spine: Mild degenerative changes but no acute findings. ?10/15>> CTA chest: No PE, bibasilar atelectatic changes. ?10/16>> left lower extremity Doppler: No DVT. ?10/16>> MRI left tibia/fibula: Patchy bone marrow edema in proximal left tibia-?  Bone infarct. ?  ?  ?10/7>> COVID PCR: Positive with CT value 22.7 ?10/15>> COVID PCR: Negative ?10/16>> COVID PCR: Negative ?  ?  ?Brief Hospital Course: ?COVID-19 infection: Mild cough-but otherwise asymptomatic.  Doubt any isolation required-she was initially diagnosed on 10/7-but has had to repeat PCR test negative..   ?  ?Left lower extremity pain: Unclear etiology-no swelling/erythema evident on exam.  Doppler studies x2 negative for DVT, foot warm-dorsalis pedis pulse easily palpable (per vascular tech-all arterial waveforms/flow normal).  MRI lumbar spine negative for any significant  spinal stenosis.  MRI left leg done on 10/16-shows possible developing bone infarct-Case was discussed with orthopedics on-call-recommendations for supportive care/pain management/mobilization and weightbearing as tolerated.  Pain has markedly improved over the past few days-continue Neurontin-use Tylenol and NSAIDs for breakthrough pain-use as needed oxycodone for intractable pain in spite of using Tylenol/Motrin.  Have asked patient to follow-up with PCP in consider repeating an MRI of the left leg in a few weeks to for other evaluate. ?  ?Hypokalemia: Repleted. ?   ?Hypothyroidism: Continue Synthroid ?  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 ?  ?Tobacco abuse: Continue transdermal nicotine ?  ?Obesity: ?Estimated body mass index is 30.15 kg/m? as calculated from the following: ?  Height as of this encounter: 5\' 9"  (1.753 m). ?  Weight as of this encounter: 92.6 kg.  ?  ?  ?Procedures ?None ?  ?Discharge Diagnoses:  ?Principal Problem: ?  COVID-19 virus infection ?Active Problems: ?  Hypothyroidism ?  Acute respiratory failure with hypoxia (HCC) ?  Essential hypertension ?  Mixed hyperlipidemia ?  Tobacco abuse ?  ?Patient states since discharge she is no longer having leg pain ? ?Outpatient Encounter Medications as of 08/10/2021  ?Medication Sig  ? acetaminophen (TYLENOL) 500 MG tablet Take 2 tablets (1,000 mg total) by mouth every 8 (eight) hours as needed for mild pain.  ? ibuprofen (ADVIL) 200 MG tablet Take 800 mg by mouth every 6 (six) hours as needed for headache or moderate pain.  ? nicotine (NICODERM CQ - DOSED IN MG/24 HOURS) 21 mg/24hr patch Place 1 patch (21 mg total) onto the skin daily.  ? [DISCONTINUED] calcium gluconate 500 MG tablet Take 1 tablet (500 mg total) by mouth 2 (two) times daily.  ? [DISCONTINUED] gabapentin (NEURONTIN) 300 MG capsule Take 1 capsule (300 mg total) by mouth 3 (three) times daily.  ? [DISCONTINUED] levothyroxine (SYNTHROID) 100 MCG tablet TAKE 1 TABLET(100 MCG) BY MOUTH DAILY BEFORE BREAKFAST (Patient taking differently: Take 100 mcg by mouth daily before breakfast.)  ? [DISCONTINUED] methocarbamol (ROBAXIN) 500 MG tablet Take 1 tablet (500 mg total) by mouth every 8 (eight) hours as needed for muscle spasms.  ? [DISCONTINUED] oxyCODONE (OXY IR/ROXICODONE) 5 MG immediate release tablet Take 1 tablet (5 mg total) by mouth every 6 (six) hours as needed for moderate pain.  ? gabapentin (NEURONTIN) 300 MG capsule Take  1 capsule (300 mg total) by mouth 3 (three) times daily.  ? levothyroxine (SYNTHROID) 100 MCG tablet Take 1 tablet (100 mcg total) by mouth daily before breakfast.  ? methocarbamol (ROBAXIN) 500 MG tablet Take 1 tablet (500 mg total) by mouth every 8 (eight) hours as needed for muscle spasms.  ? [DISCONTINUED] aspirin 325 MG EC tablet Take 1 tablet (325 mg total) by mouth daily. (Patient not taking: Reported on 08/10/2021)  ? ?No facility-administered encounter medications on file as of 08/10/2021.  ? ? ?Past Medical History:  ?Diagnosis Date  ? Eczema   ? Hypothyroid   ? Thyroid disease   ? ? ?Past Surgical History:  ?Procedure Laterality Date  ? ABDOMINAL HYSTERECTOMY    ? TUBAL LIGATION    ? ? ?Family History  ?Problem Relation Age of Onset  ? Heart disease Mother   ? Hypertension Mother   ? Cancer Father   ? Heart disease Sister   ? Hypertension Sister   ? Hypertension Brother   ? ? ?  Social History  ? ?Socioeconomic History  ? Marital status: Married  ?  Spouse name: Not on file  ? Number of children: Not on file  ? Years of education: Not on file  ? Highest education level: Not on file  ?Occupational History  ? Not on file  ?Tobacco Use  ? Smoking status: Every Day  ?  Packs/day: 1.50  ?  Types: Cigarettes  ? Smokeless tobacco: Never  ?Substance and Sexual Activity  ? Alcohol use: Yes  ?  Comment: rarely  ? Drug use: No  ? Sexual activity: Not on file  ?Other Topics Concern  ? Not on file  ?Social History Narrative  ? Not on file  ? ?Social Determinants of Health  ? ?Financial Resource Strain: Not on file  ?Food Insecurity: Not on file  ?Transportation Needs: Not on file  ?Physical Activity: Not on file  ?Stress: Not on file  ?Social Connections: Not on file  ?Intimate Partner Violence: Not on file  ? ? ?Review of Systems  ?Constitutional:  Negative for chills, diaphoresis, fever, malaise/fatigue and weight loss.  ?HENT:  Negative for congestion, hearing loss, nosebleeds, sore throat and tinnitus.   ?Eyes:   Negative for blurred vision, photophobia and redness.  ?Respiratory:  Positive for cough, sputum production and wheezing. Negative for hemoptysis, shortness of breath and stridor.   ?Cardiovascular:  Negative f

## 2021-08-10 NOTE — Patient Instructions (Addendum)
Resume methocarbamol 3 times daily as needed for back spasm and resume gabapentin 3 times daily for pain in the lower back ? ?Continue thyroid medication daily ? ?Complete set of lab screenings will be obtained ? ?Fecal occult kit will be issued for colon cancer screening ? ?Stop smoking using nicotine patch daily and see attachment ? ?Please get your orange card and [place] discount application process as soon as possible so we can get you referred to orthopedic spine for potential injections to the back ? ?Mammogram will be ordered ? ?Return to see Dr. Joya Gaskins 2 months ?

## 2021-08-10 NOTE — Assessment & Plan Note (Signed)
? ? ?  Current smoking consumption amount: 1 and half packs a day ?? Dicsussion on advise to quit smoking and smoking impacts: Lung and cardiovascular impacts ? ?? Patient's willingness to quit: Wants to quit ? ?? Methods to quit smoking discussed: Behavioral modification nicotine replacement ? ?? Medication management of smoking session drugs discussed: Nicotine patches ? ?? Resources provided:  AVS  ? ?? Setting quit date not established ? ?? Follow-up arranged 2 months ? ? ?Time spent counseling the patient: 5 minutes ? ?

## 2021-08-10 NOTE — Assessment & Plan Note (Signed)
PostHistory of spinal stenosis and also lumbar radiculopathy will rub Robaxin and gabapentin and when she gets the orange card can send her to orthospine for further evaluation ?

## 2021-08-10 NOTE — Assessment & Plan Note (Signed)
Blood pressure currently not an issue we will check metabolic panel and blood counts hold on medications ?

## 2021-08-10 NOTE — Assessment & Plan Note (Signed)
Reassess lipid panel 

## 2021-08-11 ENCOUNTER — Telehealth: Payer: Self-pay

## 2021-08-11 ENCOUNTER — Other Ambulatory Visit: Payer: Self-pay | Admitting: Critical Care Medicine

## 2021-08-11 DIAGNOSIS — E782 Mixed hyperlipidemia: Secondary | ICD-10-CM

## 2021-08-11 LAB — HCV INTERPRETATION

## 2021-08-11 LAB — COMPREHENSIVE METABOLIC PANEL
ALT: 9 IU/L (ref 0–32)
AST: 18 IU/L (ref 0–40)
Albumin/Globulin Ratio: 2 (ref 1.2–2.2)
Albumin: 4.6 g/dL (ref 3.8–4.8)
Alkaline Phosphatase: 67 IU/L (ref 44–121)
BUN/Creatinine Ratio: 14 (ref 12–28)
BUN: 11 mg/dL (ref 8–27)
Bilirubin Total: 0.2 mg/dL (ref 0.0–1.2)
CO2: 20 mmol/L (ref 20–29)
Calcium: 9.9 mg/dL (ref 8.7–10.3)
Chloride: 107 mmol/L — ABNORMAL HIGH (ref 96–106)
Creatinine, Ser: 0.8 mg/dL (ref 0.57–1.00)
Globulin, Total: 2.3 g/dL (ref 1.5–4.5)
Glucose: 88 mg/dL (ref 70–99)
Potassium: 4.2 mmol/L (ref 3.5–5.2)
Sodium: 143 mmol/L (ref 134–144)
Total Protein: 6.9 g/dL (ref 6.0–8.5)
eGFR: 83 mL/min/{1.73_m2} (ref >59)

## 2021-08-11 LAB — LIPID PANEL
Chol/HDL Ratio: 4.8 ratio — ABNORMAL HIGH (ref 0.0–4.4)
Cholesterol, Total: 188 mg/dL (ref 100–199)
HDL: 39 mg/dL — ABNORMAL LOW (ref >39)
LDL Chol Calc (NIH): 123 mg/dL — ABNORMAL HIGH (ref 0–99)
Triglycerides: 144 mg/dL (ref 0–149)
VLDL Cholesterol Cal: 26 mg/dL (ref 5–40)

## 2021-08-11 LAB — CBC WITH DIFFERENTIAL/PLATELET
Basophils Absolute: 0.1 10*3/uL (ref 0.0–0.2)
Basos: 1 %
EOS (ABSOLUTE): 0.1 10*3/uL (ref 0.0–0.4)
Eos: 2 %
Hematocrit: 46.8 % — ABNORMAL HIGH (ref 34.0–46.6)
Hemoglobin: 16.2 g/dL — ABNORMAL HIGH (ref 11.1–15.9)
Immature Grans (Abs): 0 10*3/uL (ref 0.0–0.1)
Immature Granulocytes: 0 %
Lymphocytes Absolute: 2.5 10*3/uL (ref 0.7–3.1)
Lymphs: 37 %
MCH: 31.8 pg (ref 26.6–33.0)
MCHC: 34.6 g/dL (ref 31.5–35.7)
MCV: 92 fL (ref 79–97)
Monocytes Absolute: 0.4 10*3/uL (ref 0.1–0.9)
Monocytes: 6 %
Neutrophils Absolute: 3.7 10*3/uL (ref 1.4–7.0)
Neutrophils: 54 %
Platelets: 485 10*3/uL — ABNORMAL HIGH (ref 150–450)
RBC: 5.1 x10E6/uL (ref 3.77–5.28)
RDW: 13.6 % (ref 11.7–15.4)
WBC: 6.8 10*3/uL (ref 3.4–10.8)

## 2021-08-11 LAB — THYROID PANEL WITH TSH
Free Thyroxine Index: 2.3 (ref 1.2–4.9)
T3 Uptake Ratio: 25 % (ref 24–39)
T4, Total: 9.3 ug/dL (ref 4.5–12.0)
TSH: 3.81 u[IU]/mL (ref 0.450–4.500)

## 2021-08-11 LAB — HCV AB W REFLEX TO QUANT PCR: HCV Ab: NONREACTIVE

## 2021-08-11 MED ORDER — ATORVASTATIN CALCIUM 10 MG PO TABS: 10.0000 mg | ORAL_TABLET | Freq: Every day | ORAL | 3 refills | Status: DC

## 2021-08-11 NOTE — Telephone Encounter (Signed)
-----   Message from Storm Frisk, MD sent at 08/11/2021 11:12 AM EDT ----- ?Let pt know kidney liver normal, blood counts normal, cholesterol high, recommend low dose cholesterol pill daily sent to her pharmacy, thyroid is normal no change in thyroid pill dose, hep C was negative ?

## 2021-08-11 NOTE — Telephone Encounter (Signed)
Pt was called and is aware of results, DOB was confirmed.  ?

## 2021-08-13 ENCOUNTER — Other Ambulatory Visit: Payer: Self-pay

## 2021-08-14 LAB — FECAL OCCULT BLOOD, IMMUNOCHEMICAL: Fecal Occult Bld: NEGATIVE

## 2021-10-12 ENCOUNTER — Ambulatory Visit: Payer: Medicaid Other | Attending: Critical Care Medicine | Admitting: Critical Care Medicine

## 2021-10-12 ENCOUNTER — Other Ambulatory Visit: Payer: Self-pay

## 2021-10-12 ENCOUNTER — Encounter: Payer: Self-pay | Admitting: Critical Care Medicine

## 2021-10-12 VITALS — BP 140/90 | HR 77 | Wt 201.2 lb

## 2021-10-12 DIAGNOSIS — M4800 Spinal stenosis, site unspecified: Secondary | ICD-10-CM | POA: Insufficient documentation

## 2021-10-12 DIAGNOSIS — E039 Hypothyroidism, unspecified: Secondary | ICD-10-CM | POA: Insufficient documentation

## 2021-10-12 DIAGNOSIS — Z79890 Hormone replacement therapy (postmenopausal): Secondary | ICD-10-CM | POA: Insufficient documentation

## 2021-10-12 DIAGNOSIS — Z760 Encounter for issue of repeat prescription: Secondary | ICD-10-CM | POA: Insufficient documentation

## 2021-10-12 DIAGNOSIS — Z79899 Other long term (current) drug therapy: Secondary | ICD-10-CM | POA: Insufficient documentation

## 2021-10-12 DIAGNOSIS — E038 Other specified hypothyroidism: Secondary | ICD-10-CM

## 2021-10-12 DIAGNOSIS — F17210 Nicotine dependence, cigarettes, uncomplicated: Secondary | ICD-10-CM | POA: Insufficient documentation

## 2021-10-12 DIAGNOSIS — E782 Mixed hyperlipidemia: Secondary | ICD-10-CM | POA: Insufficient documentation

## 2021-10-12 MED ORDER — ATORVASTATIN CALCIUM 10 MG PO TABS
10.0000 mg | ORAL_TABLET | Freq: Every day | ORAL | 3 refills | Status: DC
  Filled 2021-10-12: qty 30, 30d supply, fill #0
  Filled 2021-12-03 – 2021-12-15 (×2): qty 30, 30d supply, fill #1
  Filled 2022-01-24 – 2022-07-25 (×4): qty 30, 30d supply, fill #2
  Filled 2022-09-01: qty 90, 90d supply, fill #3
  Filled 2022-09-09: qty 30, 30d supply, fill #3

## 2021-10-12 MED ORDER — PREDNISONE 10 MG PO TABS
ORAL_TABLET | ORAL | 0 refills | Status: DC
  Filled 2021-10-12: qty 20, 5d supply, fill #0

## 2021-10-12 MED ORDER — MELOXICAM 15 MG PO TABS
15.0000 mg | ORAL_TABLET | Freq: Every day | ORAL | 3 refills | Status: DC
  Filled 2021-10-12: qty 30, 30d supply, fill #0

## 2021-10-12 MED ORDER — METHOCARBAMOL 500 MG PO TABS
500.0000 mg | ORAL_TABLET | Freq: Four times a day (QID) | ORAL | 1 refills | Status: DC | PRN
  Filled 2021-10-12: qty 120, 30d supply, fill #0
  Filled 2021-12-15: qty 120, 30d supply, fill #1

## 2021-10-12 NOTE — Assessment & Plan Note (Signed)
Lipid panel revealed elevated LDL. Continue Atorvastatin.

## 2021-10-12 NOTE — Progress Notes (Signed)
Established Patient Office Visit  Subjective   Patient ID: Sol Passer, female    DOB: 1961-02-21  Age: 61 y.o. MRN: [id number]  CC: PCP follow up   Arna Luis is a 61 year old female with a history of hypertension, hypothyroidism, and spinal stenosis presents for follow up. Today she presents with worsening lower back pain. Other wise no further acute concerns.She states she worked this weekend at [employer] and exacerbated her condition at work with excessive walking and woke up in tears today. She states at times she experiences painful radiation to legs. She is requesting prednisone . In the past she has had injections that relieved her pain for 3 to 4 months.  Denies local trauma, loss of bowel, urinary incontinence.  Blood Pressure today 140/90 on second measurement. Patient continues to smoke 1-1.5 packs per day. She had a plan to quit on her birthday however failed. Denies alcohol or illicit drug use.  She was referred for mammogram at last visit but has not made an appointment yet.  Denied orange card. Requesting medication refills.    Patient Active Problem List   Diagnosis Date Noted   Aortic atherosclerosis ([place]) 08/10/2021   Essential hypertension 01/29/2021   Anxiety 01/29/2021   Mixed anxiety and depressive disorder 01/29/2021   Mixed hyperlipidemia 01/29/2021   Prediabetes 01/29/2021   Sciatica 01/29/2021   Tobacco abuse 01/29/2021   Hypothyroidism    Spinal stenosis    Arthritis 01/14/2020   Obesity 01/14/2020   Low back strain 07/19/2018   Past Medical History:  Diagnosis Date   Eczema    Hypothyroid    Thyroid disease    Past Surgical History:  Procedure Laterality Date   ABDOMINAL HYSTERECTOMY     TUBAL LIGATION     Social History   Tobacco Use   Smoking status: Every Day    Packs/day: 1.50    Types: Cigarettes   Smokeless tobacco: Never  Substance Use Topics   Alcohol use: Yes    Comment: rarely   Drug use: No   Social History    Socioeconomic History   Marital status: Married    Spouse name: Not on file   Number of children: Not on file   Years of education: Not on file   Highest education level: Not on file  Occupational History   Not on file  Tobacco Use   Smoking status: Every Day    Packs/day: 1.50    Types: Cigarettes   Smokeless tobacco: Never  Substance and Sexual Activity   Alcohol use: Yes    Comment: rarely   Drug use: No   Sexual activity: Not on file  Other Topics Concern   Not on file  Social History Narrative   Not on file   Social Determinants of Health   Financial Resource Strain: Not on file  Food Insecurity: Not on file  Transportation Needs: Not on file  Physical Activity: Not on file  Stress: Not on file  Social Connections: Not on file  Intimate Partner Violence: Not on file   Family Status  Relation Name Status   Mother  Deceased   Father  Deceased   Sister  Alive   Brother  Alive   Family History  Problem Relation Age of Onset   Heart disease Mother    Hypertension Mother    Cancer Father    Heart disease Sister    Hypertension Sister    Hypertension Brother    Allergies  Allergen Reactions  Dilaudid [Hydromorphone Hcl] Nausea And Vomiting   Tramadol Nausea And Vomiting    Review of Systems  Constitutional: Negative.   HENT: Negative.    Eyes: Negative.   Respiratory: Negative.    Cardiovascular: Negative.   Gastrointestinal: Negative.   Genitourinary: Negative.   Musculoskeletal:  Positive for back pain.  Skin: Negative.   Neurological: Negative.   Endo/Heme/Allergies: Negative.   Psychiatric/Behavioral: Negative.        Objective:     BP 140/90   Pulse 77   Wt 201 lb 3.2 oz (91.3 kg)   SpO2 96%   BMI 29.71 kg/m  BP Readings from Last 3 Encounters:  10/12/21 140/90  08/10/21 122/81  02/03/21 115/72   Wt Readings from Last 3 Encounters:  10/12/21 201 lb 3.2 oz (91.3 kg)  08/10/21 215 lb 6.4 oz (97.7 kg)  01/30/21 204 lb 2.3 oz  (92.6 kg)      Physical Exam Constitutional:      Appearance: Normal appearance.  HENT:     Nose: Nose normal.     Mouth/Throat:     Mouth: Mucous membranes are moist.     Dentition: Dental caries present.     Pharynx: Oropharynx is clear.  Cardiovascular:     Rate and Rhythm: Normal rate and regular rhythm.     Pulses: Normal pulses.     Heart sounds: Normal heart sounds.  Pulmonary:     Effort: Pulmonary effort is normal.     Breath sounds: Normal breath sounds.  Abdominal:     General: Abdomen is flat. Bowel sounds are normal.     Palpations: Abdomen is soft.  Musculoskeletal:     Lumbar back: Tenderness present. Decreased range of motion.  Neurological:     Mental Status: She is alert.      No results found for any visits on 10/12/21.  Last CBC Lab Results  Component Value Date   WBC 6.8 08/10/2021   HGB 16.2 (H) 08/10/2021   HCT 46.8 (H) 08/10/2021   MCV 92 08/10/2021   MCH 31.8 08/10/2021   RDW 13.6 08/10/2021   PLT 485 (H) 16/01/9603   Last metabolic panel Lab Results  Component Value Date   GLUCOSE 88 08/10/2021   NA 143 08/10/2021   K 4.2 08/10/2021   CL 107 (H) 08/10/2021   CO2 20 08/10/2021   BUN 11 08/10/2021   CREATININE 0.80 08/10/2021   EGFR 83 08/10/2021   CALCIUM 9.9 08/10/2021   PROT 6.9 08/10/2021   ALBUMIN 4.6 08/10/2021   LABGLOB 2.3 08/10/2021   AGRATIO 2.0 08/10/2021   BILITOT 0.2 08/10/2021   ALKPHOS 67 08/10/2021   AST 18 08/10/2021   ALT 9 08/10/2021   ANIONGAP 7 02/03/2021   Last lipids Lab Results  Component Value Date   CHOL 188 08/10/2021   HDL 39 (L) 08/10/2021   LDLCALC 123 (H) 08/10/2021   TRIG 144 08/10/2021   CHOLHDL 4.8 (H) 08/10/2021   Last hemoglobin A1c No results found for: "HGBA1C" Last thyroid functions Lab Results  Component Value Date   TSH 3.810 08/10/2021   T4TOTAL 9.3 08/10/2021   Last vitamin D No results found for: "25OHVITD2", "25OHVITD3", "VD25OH" Last vitamin B12 and Folate No  results found for: "VITAMINB12", "FOLATE"  The 10-year ASCVD risk score (Arnett DK, et al., 2019) is: 17.2%    Assessment & Plan:   Problem List Items Addressed This Visit       Endocrine   Hypothyroidism  Continue Synthroid. Will continue to monitor.        Other   Spinal stenosis    Patient presenting with worsening lower back pain with radiation.  Prednisone 4 tablets daily for 5 days  Robaxin methocarbamol 4 times a day as needed for muscle spasm meloxicam 1 daily for back pain Continue Gabapentin for radiculopathy Apply lidocaine patch to the back as needed      Relevant Medications   methocarbamol (ROBAXIN) 500 MG tablet   Mixed hyperlipidemia    Lipid panel revealed elevated LDL. Continue Atorvastatin.      Relevant Medications   atorvastatin (LIPITOR) 10 MG tablet    No follow-ups on file.    Asencion Noble, MD

## 2021-10-12 NOTE — Progress Notes (Deleted)
New Patient Office Visit  Subjective    Patient ID: [name], female    DOB: [birth year]  Age: 61 y.o. MRN: [id number]  CC:  No chief complaint on file.   HPI 08/10/21 Tkeya Stencil presents to establish care This patient previously was seen in this clinic in 2021 and then saw Dr. Julio Sicks previously.  Patient with history of chronic low back pain recent imaging in 2021 showed L4-L5 nerve impingement and she was seen by [facility] and they gave her spinal injections with some relief.  She states the pain is now worsening again and wishes this reassessed.  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  During that hospitalization imaging of the abdomen showed lymphadenopathy in the retroperitoneal area.  The patient is recovered from this.  On arrival blood pressure 122/81.  She is smoking now 1-1/2 pack a day of cigarettes she quit January now smoking again because of stress.  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  She is also had some chest congestion and coughing up some minimal mucus for the last several weeks with the smoking.  She does need medication refills she has hypothyroidism  6/27  MAMMO  fobt was NEG     Cardiovascular and Mediastinum   Essential hypertension - Prima ry    Blood pressure currently not an issue we will check metabolic panel and blood counts hold on medications       Relevant Orders   Comprehensive metabolic panel   CBC with Differential/Platelet   Aortic atherosclerosis (HCC)    Assess lipid panel       Relevant Orders   Lipid panel     Respiratory   RESOLVED: Acute respiratory failure with hypoxia (HCC)     Endocrine   Hypothyroidism    We will reassess thyroid function and refill levothyroxine       Relevant Medications   levothyroxine (SYNTHROID) 100 MCG tablet   Other Relevant  Orders   Thyroid Panel With TSH     Other   Spinal stenosis    PostHistory of spinal stenosis and also lumbar radiculopathy will rub Robaxin and gabapentin and when she gets the orange card can send her to orthospine for further evaluation       Relevant Medications   methocarbamol (ROBAXIN) 500 MG tablet   Mixed hyperlipidemia    Reassess lipid panel       Tobacco abuse       Current smoking consumption amount: 1 and half packs a day Dicsussion on advise to quit smoking and smoking impacts: Lung and cardiovascular impacts  Patient's willingness to quit: Wants to quit  Methods to quit smoking discussed: Behavioral modification nicotine replacement  Medication management of smoking session drugs discussed: Nicotine patches  Resources provided:  AVS   Setting quit date not established  Follow-up arranged 2 months   Time spent counseling the patient: 5 minutes       Other Visit Diagnoses     Encounter for screening mammogram for malignant neoplasm of breast       Relevant Orders   MM DIGITAL SCREENING BILATERAL   Need for hepatitis C screening test       Relevant Orders   HCV Ab w Reflex to Quant PCR   Colon cancer screening       Relevant Orders  Fecal occult blood, imunochemical     Screen for hepatitis C also obtain mammogram with scholarship program screen for colon cancer with fecal occult testing  This patient's had total hysterectomy does not need a Pap smear  Patient does need mammogram and colon screening  01/2021 Hosp DC Admit Date: 01/29/2021 Discharge date: 02/03/2021   Recommendations for Outpatient Follow-up:  Follow up with PCP in 1-2 weeks Please obtain CMP/CBC in one week Please do repeat MRI of left leg in 3 to 4 weeks Repeat MRI abdomen in 1 year to assess for lymphatic malformation   Admitted From:  Home   Disposition: Home   Home Health: No   Equipment/Devices: None   Discharge Condition: Stable   CODE  STATUS: FULL CODE   Diet recommendation:  Diet Order                  Diet general             Diet regular Room service appropriate? Yes; Fluid consistency: Thin  Diet effective now                         Brief Summary: Patient is a 61 y.o. female with history of HTN, HLD, hypothyroidism-diagnosed with COVID-19 on 10/7 (numerous family members positive as well)-presenting to the ED on 10/14 with severe left lower extremity pain (similar ED visit on 10/11-Doppler negative for DVT)   Pertinent labs/Radiology: 10/11>> left lower extremity Doppler: No DVT 10/14>> MRI lumbar spine: Mild degenerative changes but no acute findings. 10/15>> CTA chest: No PE, bibasilar atelectatic changes. 10/16>> left lower extremity Doppler: No DVT. 10/16>> MRI left tibia/fibula: Patchy bone marrow edema in proximal left tibia-?  Bone infarct.     10/7>> COVID PCR: Positive with CT value 22.7 10/15>> COVID PCR: Negative 10/16>> COVID PCR: Negative     Brief Hospital Course: COVID-19 infection: Mild cough-but otherwise asymptomatic.  Doubt any isolation required-she was initially diagnosed on 10/7-but has had to repeat PCR test negative..     Left lower extremity pain: Unclear etiology-no swelling/erythema evident on exam.  Doppler studies x2 negative for DVT, foot warm-dorsalis pedis pulse easily palpable (per vascular tech-all arterial waveforms/flow normal).  MRI lumbar spine negative for any significant spinal stenosis.  MRI left leg done on 10/16-shows possible developing bone infarct-Case was discussed with orthopedics on-call-recommendations for supportive care/pain management/mobilization and weightbearing as tolerated.  Pain has markedly improved over the past few days-continue Neurontin-use Tylenol and NSAIDs for breakthrough pain-use as needed oxycodone for intractable pain in spite of using Tylenol/Motrin.  Have asked patient to follow-up with PCP in consider repeating an MRI of the left  leg in a few weeks to for other evaluate.   Hypokalemia: Repleted.   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   Tobacco abuse: Continue transdermal nicotine   Obesity: Estimated body mass index is 30.15 kg/m as calculated from the following:   Height as of this encounter: 5\' 9"  (1.753 m).   Weight as of this encounter: 92.6 kg.      Procedures None   Discharge Diagnoses:  Principal Problem:   COVID-19 virus infection Active Problems:   Hypothyroidism   Acute respiratory failure with hypoxia (HCC)   Essential hypertension   Mixed hyperlipidemia   Tobacco abuse   Patient states since discharge she is no  longer having leg pain  Outpatient Encounter Medications as of 10/12/2021  Medication Sig   acetaminophen (TYLENOL) 500 MG tablet Take 2 tablets (1,000 mg total) by mouth every 8 (eight) hours as needed for mild pain.   atorvastatin (LIPITOR) 10 MG tablet Take 1 tablet (10 mg total) by mouth daily.   gabapentin (NEURONTIN) 300 MG capsule Take 1 capsule (300 mg total) by mouth 3 (three) times daily.   ibuprofen (ADVIL) 200 MG tablet Take 800 mg by mouth every 6 (six) hours as needed for headache or moderate pain.   levothyroxine (SYNTHROID) 100 MCG tablet Take 1 tablet (100 mcg total) by mouth daily before breakfast.   methocarbamol (ROBAXIN) 500 MG tablet Take 1 tablet (500 mg total) by mouth every 8 (eight) hours as needed for muscle spasms.   nicotine (NICODERM CQ - DOSED IN MG/24 HOURS) 21 mg/24hr patch Place 1 patch (21 mg total) onto the skin daily.   No facility-administered encounter medications on file as of 10/12/2021.    Past Medical History:  Diagnosis Date   Eczema    Hypothyroid    Thyroid disease     Past Surgical History:  Procedure Laterality Date   ABDOMINAL HYSTERECTOMY     TUBAL LIGATION       Family History  Problem Relation Age of Onset   Heart disease Mother    Hypertension Mother    Cancer Father    Heart disease Sister    Hypertension Sister    Hypertension Brother     Social History   Socioeconomic History   Marital status: Married    Spouse name: Not on file   Number of children: Not on file   Years of education: Not on file   Highest education level: Not on file  Occupational History   Not on file  Tobacco Use   Smoking status: Every Day    Packs/day: 1.50    Types: Cigarettes   Smokeless tobacco: Never  Substance and Sexual Activity   Alcohol use: Yes    Comment: rarely   Drug use: No   Sexual activity: Not on file  Other Topics Concern   Not on file  Social History Narrative   Not on file   Social Determinants of Health   Financial Resource Strain: Not on file  Food Insecurity: Not on file  Transportation Needs: Not on file  Physical Activity: Not on file  Stress: Not on file  Social Connections: Not on file  Intimate Partner Violence: Not on file    Review of Systems  Constitutional:  Negative for chills, diaphoresis, fever, malaise/fatigue and weight loss.  HENT:  Negative for congestion, hearing loss, nosebleeds, sore throat and tinnitus.   Eyes:  Negative for blurred vision, photophobia and redness.  Respiratory:  Positive for cough, sputum production and wheezing. Negative for hemoptysis, shortness of breath and stridor.   Cardiovascular:  Negative for chest pain, palpitations, orthopnea, claudication, leg swelling and PND.  Gastrointestinal:  Negative for abdominal pain, blood in stool, constipation, diarrhea, heartburn, nausea and vomiting.  Genitourinary:  Negative for dysuria, flank pain, frequency, hematuria and urgency.  Musculoskeletal:  Positive for back pain. Negative for falls, joint pain, myalgias and neck pain.  Skin:  Negative for itching and rash.  Neurological:  Negative for dizziness, tingling, tremors, sensory  change, speech change, focal weakness, seizures, loss of consciousness, weakness and headaches.  Endo/Heme/Allergies:  Negative for environmental allergies and polydipsia. Does not bruise/bleed easily.  Psychiatric/Behavioral:  Negative for  depression, memory loss, substance abuse and suicidal ideas. The patient is not nervous/anxious and does not have insomnia.         Objective    There were no vitals taken for this visit.  Physical Exam Vitals reviewed.  Constitutional:      Appearance: Normal appearance. She is well-developed. She is obese. She is not diaphoretic.  HENT:     Head: Normocephalic and atraumatic.     Nose: No nasal deformity, septal deviation, mucosal edema or rhinorrhea.     Right Sinus: No maxillary sinus tenderness or frontal sinus tenderness.     Left Sinus: No maxillary sinus tenderness or frontal sinus tenderness.     Mouth/Throat:     Pharynx: No oropharyngeal exudate.  Eyes:     General: No scleral icterus.    Conjunctiva/sclera: Conjunctivae normal.     Pupils: Pupils are equal, round, and reactive to light.  Neck:     Thyroid: No thyromegaly.     Vascular: No carotid bruit or JVD.     Trachea: Trachea normal. No tracheal tenderness or tracheal deviation.  Cardiovascular:     Rate and Rhythm: Normal rate and regular rhythm.     Chest Wall: PMI is not displaced.     Pulses: Normal pulses. No decreased pulses.     Heart sounds: Normal heart sounds, S1 normal and S2 normal. Heart sounds not distant. No murmur heard.    No systolic murmur is present.     No diastolic murmur is present.     No friction rub. No gallop. No S3 or S4 sounds.  Pulmonary:     Effort: Pulmonary effort is normal. No tachypnea, accessory muscle usage or respiratory distress.     Breath sounds: Normal breath sounds. No stridor. No decreased breath sounds, wheezing, rhonchi or rales.  Chest:     Chest wall: No tenderness.  Abdominal:     General: Bowel sounds are normal. There  is no distension.     Palpations: Abdomen is soft. Abdomen is not rigid.     Tenderness: There is no abdominal tenderness. There is no guarding or rebound.  Musculoskeletal:        General: Normal range of motion.     Cervical back: Normal range of motion and neck supple. No edema, erythema or rigidity. No muscular tenderness. Normal range of motion.  Lymphadenopathy:     Head:     Right side of head: No submental or submandibular adenopathy.     Left side of head: No submental or submandibular adenopathy.     Cervical: No cervical adenopathy.  Skin:    General: Skin is warm and dry.     Coloration: Skin is not pale.     Findings: No rash.     Nails: There is no clubbing.  Neurological:     Mental Status: She is alert and oriented to person, place, and time.     Sensory: No sensory deficit.  Psychiatric:        Speech: Speech normal.        Behavior: Behavior normal.         Assessment & Plan:   Problem List Items Addressed This Visit   None Screen for hepatitis C also obtain mammogram with scholarship program screen for colon cancer with fecal occult testing  No follow-ups on file.   Shan Levans, MD

## 2021-10-12 NOTE — Assessment & Plan Note (Addendum)
Continue Synthroid. Will continue to monitor.

## 2021-10-13 ENCOUNTER — Other Ambulatory Visit: Payer: Self-pay

## 2021-10-13 ENCOUNTER — Encounter: Payer: Self-pay | Admitting: Critical Care Medicine

## 2021-10-13 ENCOUNTER — Other Ambulatory Visit: Payer: Self-pay | Admitting: Pharmacist

## 2021-10-13 NOTE — Chronic Care Management (AMB) (Signed)
Patient seen by Jacolyn Reedy, PharmD Candidate on 10/13/21 while they were picking up prescriptions at [facility].   Patient has an automated home blood pressure machine, but they are not checking regularly  Medication review was performed. They are taking medications as prescribed  The following barriers to adherence were noted: - Forgetting to take medications  The following interventions were completed:  - Medications were reviewed - Patient was educated on medications, including indication and administration - Patient was educated on proper technique to check home blood pressure and reminded to bring home machine and readings to next provider appointment - Patient was educated on how to access home blood pressure machine - Patient was counseled on lifestyle modifications to improve blood pressure   Discussed symptoms of high and low BP, diet, exercise, salt intake, improtance of adherance, BP cuff use and logging, fluid intake and sleep habbits. Gave BP log, BP measuring guide and lifestyle modifications packet  The patient has follow up scheduled:  PCP: encouraged to schedule   Catie Eppie Gibson, PharmD, [facility] [phone]

## 2021-12-03 ENCOUNTER — Other Ambulatory Visit: Payer: Self-pay

## 2021-12-09 ENCOUNTER — Other Ambulatory Visit: Payer: Self-pay

## 2021-12-15 ENCOUNTER — Other Ambulatory Visit: Payer: Self-pay

## 2021-12-16 ENCOUNTER — Other Ambulatory Visit: Payer: Self-pay

## 2021-12-22 ENCOUNTER — Encounter (HOSPITAL_COMMUNITY): Payer: Self-pay | Admitting: Emergency Medicine

## 2021-12-22 ENCOUNTER — Ambulatory Visit (HOSPITAL_COMMUNITY)
Admission: EM | Admit: 2021-12-22 | Discharge: 2021-12-22 | Disposition: A | Discharge status: Home / Self Care | Payer: Self-pay | Attending: Family Medicine | Admitting: Family Medicine

## 2021-12-22 ENCOUNTER — Ambulatory Visit (INDEPENDENT_AMBULATORY_CARE_PROVIDER_SITE_OTHER): Payer: Self-pay

## 2021-12-22 DIAGNOSIS — S93601A Unspecified sprain of right foot, initial encounter: Secondary | ICD-10-CM

## 2021-12-22 DIAGNOSIS — M79671 Pain in right foot: Secondary | ICD-10-CM

## 2021-12-22 MED ORDER — ACETAMINOPHEN 325 MG PO TABS
ORAL_TABLET | ORAL | Status: AC
  Filled 2021-12-22: qty 3

## 2021-12-22 MED ORDER — ACETAMINOPHEN 325 MG PO TABS
975.0000 mg | ORAL_TABLET | Freq: Once | ORAL | Status: AC
  Administered 2021-12-22: 975 mg via ORAL

## 2021-12-22 NOTE — ED Triage Notes (Signed)
Pt reports right foot pain and swelling. States she was walking in Wedge heels and her foot went over the edge. States injury happened about 2 pm but when she got home around 5 pm she noticed increased pain and swelling after taking the shoe off.

## 2021-12-22 NOTE — Discharge Instructions (Signed)
If not allergic, you may use over the counter ibuprofen or acetaminophen as needed. ° °

## 2021-12-25 NOTE — ED Provider Notes (Signed)
[facility]   [id number] 12/22/21 Arrival Time: 1828  ASSESSMENT & PLAN:  1. Foot pain, right   2. Sprain of right foot, initial encounter    I have personally viewed the imaging studies ordered this visit. No right foot fracture appreciated.  Meds ordered this encounter  Medications   acetaminophen (TYLENOL) tablet 975 mg  Will continue with OTC ibuprofen TID with food.  Orders Placed This Encounter  Procedures   DG Foot Complete Right   Apply CAM boot  - for comfort  Work/school excuse note: provided. Recommend:  Follow-up Information     Schedule an appointment as soon as possible for a visit  with [facility]).   Why: If worsening or failing to improve as anticipated. Contact information: [address] [place],  [place]  [address]  [phone]                Reviewed expectations re: course of current medical issues. Questions answered. Outlined signs and symptoms indicating need for more acute intervention. Patient verbalized understanding. After Visit Summary given.  SUBJECTIVE: History from: patient. Jessica Cooke is a 61 y.o. female who reports persistent lateral foot pain; RIGHT. Pt reports right foot pain and swelling. States she was walking in Wedge heels and her foot went over the edge. States injury happened about 2 pm but when she got home around 5 pm she noticed increased pain and swelling after taking the shoe off.  No extremity sensation changes or weakness. No tx PTA.   Past Surgical History:  Procedure Laterality Date   ABDOMINAL HYSTERECTOMY     TUBAL LIGATION        OBJECTIVE:  Vitals:   12/22/21 1844  BP: (!) 152/88  Pulse: 89  Resp: 18  Temp: 98.3 F (36.8 C)  TempSrc: Oral  SpO2: 95%    General appearance: alert; no distress HEENT: [place]; AT Neck: supple with FROM Resp: unlabored respirations Extremities: RLE: warm with well perfused appearance; fairly well localized moderate  tenderness over right proximal lateral foot; without gross deformities; swelling: moderate; bruising: none; ankle and toes with FROM CV: brisk extremity capillary refill of RLE; 2+ DP of RLE. Skin: warm and dry; no visible rashes Neurologic: normal sensation and strength of RLE Psychological: alert and cooperative; normal mood and affect  Imaging: DG Foot Complete Right  Result Date: 12/22/2021 CLINICAL DATA:  trauma; midfoot pain EXAM: RIGHT FOOT COMPLETE - 3+ VIEW COMPARISON:  None Available. FINDINGS: There is no evidence of fracture or dislocation. There is no evidence of arthropathy or other focal bone abnormality. Soft tissues are unremarkable. IMPRESSION: Negative. Electronically Signed   By: Tish Frederickson M.D.   On: 12/22/2021 19:40       Allergies  Allergen Reactions   Dilaudid [Hydromorphone Hcl] Nausea And Vomiting   Tramadol Nausea And Vomiting    Past Medical History:  Diagnosis Date   Eczema    Hypothyroid    Thyroid disease    Social History   Socioeconomic History   Marital status: Married    Spouse name: Not on file   Number of children: Not on file   Years of education: Not on file   Highest education level: Not on file  Occupational History   Not on file  Tobacco Use   Smoking status: Every Day    Packs/day: 1.50    Types: Cigarettes   Smokeless tobacco: Never  Substance and Sexual Activity   Alcohol use: Yes  Comment: rarely   Drug use: No   Sexual activity: Not on file  Other Topics Concern   Not on file  Social History Narrative   Not on file   Social Determinants of Health   Financial Resource Strain: Not on file  Food Insecurity: Not on file  Transportation Needs: Not on file  Physical Activity: Not on file  Stress: Not on file  Social Connections: Not on file   Family History  Problem Relation Age of Onset   Heart disease Mother    Hypertension Mother    Cancer Father    Heart disease Sister    Hypertension Sister     Hypertension Brother    Past Surgical History:  Procedure Laterality Date   ABDOMINAL HYSTERECTOMY     TUBAL LIGATION         Mardella Layman, MD 12/25/21 1029

## 2022-01-24 ENCOUNTER — Other Ambulatory Visit: Payer: Self-pay

## 2022-01-31 ENCOUNTER — Other Ambulatory Visit: Payer: Self-pay

## 2022-02-16 ENCOUNTER — Other Ambulatory Visit: Payer: Self-pay

## 2022-02-22 ENCOUNTER — Other Ambulatory Visit: Payer: Self-pay

## 2022-03-04 ENCOUNTER — Other Ambulatory Visit: Payer: Self-pay

## 2022-03-04 ENCOUNTER — Other Ambulatory Visit: Payer: Self-pay | Admitting: Critical Care Medicine

## 2022-03-04 DIAGNOSIS — M4800 Spinal stenosis, site unspecified: Secondary | ICD-10-CM

## 2022-03-04 NOTE — Telephone Encounter (Signed)
Requested medication (s) are due for refill today - yes  Requested medication (s) are on the active medication list -yes  Future visit scheduled -no  Last refill: 10/12/21 #120 1RF  Notes to clinic: non delegated Rx  Requested Prescriptions  Pending Prescriptions Disp Refills   methocarbamol (ROBAXIN) 500 MG tablet 120 tablet 1    Sig: Take 1 tablet (500 mg total) by mouth every 6 (six) hours as needed for muscle spasms.     Not Delegated - Analgesics:  Muscle Relaxants Failed - 03/04/2022 10:59 AM      Failed - This refill cannot be delegated      Passed - Valid encounter within last 6 months    Recent Outpatient Visits           4 months ago Spinal stenosis, unspecified spinal region   [facility] Frisk, [facility]   6 months ago Essential hypertension   [facility] Storm Frisk, MD   1 year ago Other specified hypothyroidism   [facility] Grayce Sessions, NP   2 years ago Post-nasal drip   [facility] [place], Odette Horns, MD   2 years ago Chronic right-sided low back pain without sciatica   [facility] [place], Marylene Land M, [place]                 Requested Prescriptions  Pending Prescriptions Disp Refills   methocarbamol (ROBAXIN) 500 MG tablet 120 tablet 1    Sig: Take 1 tablet (500 mg total) by mouth every 6 (six) hours as needed for muscle spasms.     Not Delegated - Analgesics:  Muscle Relaxants Failed - 03/04/2022 10:59 AM      Failed - This refill cannot be delegated      Passed - Valid encounter within last 6 months    Recent Outpatient Visits           4 months ago Spinal stenosis, unspecified spinal region   [facility] Storm Frisk, MD   6 months ago Essential hypertension   [facility] Storm Frisk, MD   1 year ago Other specified  hypothyroidism   [facility] Grayce Sessions, NP   2 years ago Post-nasal drip   [facility] [place], Odette Horns, MD   2 years ago Chronic right-sided low back pain without sciatica   [facility] [place], [place], [place]

## 2022-03-07 ENCOUNTER — Other Ambulatory Visit: Payer: Self-pay

## 2022-03-30 ENCOUNTER — Other Ambulatory Visit: Payer: Self-pay | Admitting: Critical Care Medicine

## 2022-03-30 ENCOUNTER — Other Ambulatory Visit: Payer: Self-pay

## 2022-03-30 DIAGNOSIS — M4800 Spinal stenosis, site unspecified: Secondary | ICD-10-CM

## 2022-03-30 MED ORDER — METHOCARBAMOL 500 MG PO TABS
500.0000 mg | ORAL_TABLET | Freq: Three times a day (TID) | ORAL | 0 refills | Status: AC | PRN
  Filled 2022-03-30 – 2022-04-08 (×2): qty 60, 20d supply, fill #0

## 2022-04-05 ENCOUNTER — Other Ambulatory Visit: Payer: Self-pay

## 2022-04-08 ENCOUNTER — Other Ambulatory Visit: Payer: Self-pay

## 2022-05-12 ENCOUNTER — Other Ambulatory Visit: Payer: Self-pay

## 2022-06-22 ENCOUNTER — Other Ambulatory Visit: Payer: Self-pay

## 2022-06-24 ENCOUNTER — Other Ambulatory Visit: Payer: Self-pay

## 2022-07-25 ENCOUNTER — Other Ambulatory Visit: Payer: Self-pay

## 2022-09-01 ENCOUNTER — Other Ambulatory Visit: Payer: Self-pay | Admitting: Critical Care Medicine

## 2022-09-01 ENCOUNTER — Other Ambulatory Visit: Payer: Self-pay

## 2022-09-01 DIAGNOSIS — M4800 Spinal stenosis, site unspecified: Secondary | ICD-10-CM

## 2022-09-02 ENCOUNTER — Encounter: Payer: Self-pay | Admitting: Pharmacist

## 2022-09-02 ENCOUNTER — Other Ambulatory Visit: Payer: Self-pay

## 2022-09-08 ENCOUNTER — Other Ambulatory Visit: Payer: Self-pay

## 2022-09-09 ENCOUNTER — Other Ambulatory Visit: Payer: Self-pay

## 2022-09-16 ENCOUNTER — Other Ambulatory Visit: Payer: Self-pay

## 2022-11-10 ENCOUNTER — Other Ambulatory Visit: Payer: Self-pay

## 2022-11-10 ENCOUNTER — Other Ambulatory Visit: Payer: Self-pay | Admitting: Critical Care Medicine

## 2022-11-10 MED ORDER — LEVOTHYROXINE SODIUM 100 MCG PO TABS
100.0000 ug | ORAL_TABLET | Freq: Every day | ORAL | 0 refills | Status: DC
  Filled 2022-11-10: qty 30, 30d supply, fill #0

## 2022-11-10 NOTE — Telephone Encounter (Signed)
Call to patient - schedule first available appointment- 12/27- courtesy 30 day Rx sent to pharmacy Requested Prescriptions  Pending Prescriptions Disp Refills   levothyroxine (SYNTHROID) 100 MCG tablet 90 tablet 2    Sig: Take 1 tablet (100 mcg total) by mouth daily before breakfast.     Endocrinology:  Hypothyroid Agents Failed - 11/10/2022  8:52 AM      Failed - TSH in normal range and within 360 days    TSH  Date Value Ref Range Status  08/10/2021 3.810 0.450 - 4.500 uIU/mL Final         Failed - Valid encounter within last 12 months    Recent Outpatient Visits           1 year ago Spinal stenosis, unspecified spinal region   [facility] Storm Frisk, MD   1 year ago Essential hypertension   [place] [place] [facility] [facility] Storm Frisk, MD   2 years ago Other specified hypothyroidism   [place] [facility] Grayce Sessions, NP   2 years ago Post-nasal drip   [facility] [place], Odette Horns, MD   2 years ago Chronic right-sided low back pain without sciatica   [facility] [place], Marzella Schlein, [place]       Future Appointments             In 5 months Claiborne Rigg, NP [facility]

## 2022-11-11 ENCOUNTER — Other Ambulatory Visit: Payer: Self-pay

## 2022-11-17 ENCOUNTER — Other Ambulatory Visit: Payer: Self-pay

## 2022-12-23 ENCOUNTER — Other Ambulatory Visit: Payer: Self-pay

## 2022-12-23 ENCOUNTER — Other Ambulatory Visit: Payer: Self-pay | Admitting: Critical Care Medicine

## 2022-12-23 NOTE — Telephone Encounter (Signed)
Requested medications are due for refill today.  yes  Requested medications are on the active medications list.  yes  Last refill. 11/10/2022 #30 0 rf  Future visit scheduled.   yes  Notes to clinic.  Labs are expired.    Requested Prescriptions  Pending Prescriptions Disp Refills   levothyroxine (SYNTHROID) 100 MCG tablet 30 tablet 0    Sig: Take 1 tablet (100 mcg total) by mouth daily before breakfast.     Endocrinology:  Hypothyroid Agents Failed - 12/23/2022  8:29 AM      Failed - TSH in normal range and within 360 days    TSH  Date Value Ref Range Status  08/10/2021 3.810 0.450 - 4.500 uIU/mL Final         Failed - Valid encounter within last 12 months    Recent Outpatient Visits           1 year ago Spinal stenosis, unspecified spinal region   [facility] Storm Frisk, MD   1 year ago Essential hypertension   [place] Storm Frisk, MD   2 years ago Other specified hypothyroidism   [place] [facility] Grayce Sessions, NP   2 years ago Post-nasal drip   [facility] [place], Odette Horns, MD   3 years ago Chronic right-sided low back pain without sciatica   [facility] [place], Marzella Schlein, [place]       Future Appointments             In 3 months Claiborne Rigg, NP [facility]

## 2022-12-24 MED ORDER — LEVOTHYROXINE SODIUM 100 MCG PO TABS
100.0000 ug | ORAL_TABLET | Freq: Every day | ORAL | 0 refills | Status: DC
  Filled 2022-12-24: qty 30, 30d supply, fill #0

## 2022-12-26 ENCOUNTER — Other Ambulatory Visit: Payer: Self-pay

## 2023-02-09 ENCOUNTER — Other Ambulatory Visit: Payer: Self-pay

## 2023-02-09 ENCOUNTER — Other Ambulatory Visit: Payer: Self-pay | Admitting: Critical Care Medicine

## 2023-02-10 NOTE — Telephone Encounter (Signed)
Requested medications are due for refill today.  yes  Requested medications are on the active medications list.  yes  Last refill. 12/24/2022 #30 0 rf  Future visit scheduled.   yes  Notes to clinic.  Labs are expired - Pt already given a courtesy refill.    Requested Prescriptions  Pending Prescriptions Disp Refills   levothyroxine (SYNTHROID) 100 MCG tablet 30 tablet 0    Sig: Take 1 tablet (100 mcg total) by mouth daily before breakfast.     Endocrinology:  Hypothyroid Agents Failed - 02/09/2023 11:51 AM      Failed - TSH in normal range and within 360 days    TSH  Date Value Ref Range Status  08/10/2021 3.810 0.450 - 4.500 uIU/mL Final         Failed - Valid encounter within last 12 months    Recent Outpatient Visits           1 year ago Spinal stenosis, unspecified spinal region   [facility] Storm Frisk, MD   1 year ago Essential hypertension   [place] [facility] Storm Frisk, MD   2 years ago Other specified hypothyroidism   [place] [facility] Grayce Sessions, NP   3 years ago Post-nasal drip   [place] [place], Odette Horns, MD   3 years ago Chronic right-sided low back pain without sciatica   [facility] [place], Marzella Schlein, [place]       Future Appointments             In 2 months Claiborne Rigg, NP [facility]

## 2023-02-13 ENCOUNTER — Other Ambulatory Visit: Payer: Self-pay

## 2023-02-13 MED ORDER — LEVOTHYROXINE SODIUM 100 MCG PO TABS
100.0000 ug | ORAL_TABLET | Freq: Every day | ORAL | 0 refills | Status: DC
  Filled 2023-02-13: qty 30, 30d supply, fill #0

## 2023-02-15 ENCOUNTER — Other Ambulatory Visit: Payer: Self-pay

## 2023-04-14 ENCOUNTER — Ambulatory Visit: Payer: Self-pay | Attending: Nurse Practitioner | Admitting: Nurse Practitioner

## 2023-05-12 ENCOUNTER — Emergency Department (HOSPITAL_COMMUNITY)
Admission: EM | Admit: 2023-05-12 | Discharge: 2023-05-12 | Disposition: A | Discharge status: Home / Self Care | Payer: Self-pay | Attending: Emergency Medicine | Admitting: Emergency Medicine

## 2023-05-12 ENCOUNTER — Encounter (HOSPITAL_COMMUNITY): Payer: Self-pay | Admitting: Emergency Medicine

## 2023-05-12 ENCOUNTER — Emergency Department (HOSPITAL_BASED_OUTPATIENT_CLINIC_OR_DEPARTMENT_OTHER): Admit: 2023-05-12 | Discharge: 2023-05-12 | Disposition: A | Discharge status: Home / Self Care | Payer: Self-pay

## 2023-05-12 ENCOUNTER — Emergency Department (HOSPITAL_COMMUNITY): Payer: Self-pay

## 2023-05-12 ENCOUNTER — Ambulatory Visit: Payer: Self-pay | Admitting: Critical Care Medicine

## 2023-05-12 DIAGNOSIS — M79605 Pain in left leg: Secondary | ICD-10-CM

## 2023-05-12 DIAGNOSIS — J101 Influenza due to other identified influenza virus with other respiratory manifestations: Secondary | ICD-10-CM | POA: Insufficient documentation

## 2023-05-12 DIAGNOSIS — M79662 Pain in left lower leg: Secondary | ICD-10-CM

## 2023-05-12 DIAGNOSIS — Z20822 Contact with and (suspected) exposure to covid-19: Secondary | ICD-10-CM | POA: Insufficient documentation

## 2023-05-12 DIAGNOSIS — Z720 Tobacco use: Secondary | ICD-10-CM | POA: Insufficient documentation

## 2023-05-12 LAB — RESP PANEL BY RT-PCR (RSV, FLU A&B, COVID)  RVPGX2
Influenza A by PCR: POSITIVE — AB
Influenza B by PCR: NEGATIVE
Resp Syncytial Virus by PCR: NEGATIVE
SARS Coronavirus 2 by RT PCR: NEGATIVE

## 2023-05-12 MED ORDER — OXYCODONE-ACETAMINOPHEN 5-325 MG PO TABS: 1.0000 | ORAL_TABLET | Freq: Four times a day (QID) | ORAL | 0 refills | Status: DC | PRN

## 2023-05-12 MED ORDER — OXYCODONE-ACETAMINOPHEN 5-325 MG PO TABS
1.0000 | ORAL_TABLET | Freq: Once | ORAL | Status: AC
  Administered 2023-05-12: 1 via ORAL
  Filled 2023-05-12: qty 1

## 2023-05-12 MED ORDER — OSELTAMIVIR PHOSPHATE 75 MG PO CAPS: 75.0000 mg | ORAL_CAPSULE | Freq: Two times a day (BID) | ORAL | 0 refills | Status: DC

## 2023-05-12 NOTE — Telephone Encounter (Signed)
noted

## 2023-05-12 NOTE — ED Triage Notes (Signed)
Pt h ere from home with c/o left lower leg pain ,started yesterday , no trauma noted , pt did have a clot /edema ? To the bone marrow of that leg 2 years ago

## 2023-05-12 NOTE — Telephone Encounter (Signed)
Copied from CRM [id number]. Topic: Clinical - Red Word Triage >> May 12, 2023 10:41 AM Antony Haste wrote: Red Word that prompted transfer to Nurse Triage: flu-like symptoms including chills, body aches, fever and sore throat- left leg is in extreme pain.   Chief Complaint: left leg pain Symptoms: 10/10 pain, shortness of breath  Frequency: ongoing since yesterday 05/11/23 Pertinent Negatives: Patient denies redness Disposition: [x] ED /[] Urgent Care (no appt availability in office) / [] Appointment(In office/virtual)/ []  [place] [facility]/ [] Home Care/ [] Refused Recommended Disposition /[] [facility]/ []  Follow-up with PCP Additional Notes: The patient called reporting that she thinks she has the flu as she was having body aches and a sore throat.  She stated that she had left leg pain 10/10.  She denied shortness of breath.  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  She was hospitalized at that time.  During the call the patient did not initially sound short of breath but the more she spoke, the more she sounded winded.  The patient checked her oxygen saturation on multiple fingers and it was between 85 and 88%.  Offered to call 911 and she declined.  She stated that she did not want to go to the ED as she preferred to discuss with her pcp further.  Advised her that due to her history of blood clots, O2 saturation below 90%, and audible shortness of breath she should be seen in the ED.  She was agreeable but declined the nurse to call 911 on her behalf.  The nurse confirmed that she was not alone.  Her husband heard the nurse on speaker and stated that they were leaving to go to the ED as soon as she got dressed. Advised them to call 911 if unable to leave soon.  They verbalized understanding.  Reason for Disposition  Oxygen level (e.g., pulse oximetry) 90 percent or lower  Answer Assessment - Initial Assessment Questions 1.  ONSET: "When did the pain start?"      05/11/2023 2. LOCATION: "Where is the pain located?"      Left leg pain 3. PAIN: "How bad is the pain?"    (Scale 1-10; or mild, moderate, severe)   -  MILD (1-3): doesn't interfere with normal activities    -  MODERATE (4-7): interferes with normal activities (e.g., work or school) or awakens from sleep, limping    -  SEVERE (8-10): excruciating pain, unable to do any normal activities, unable to walk     10/10 4. WORK OR EXERCISE: "Has there been any recent work or exercise that involved this part of the body?"      No, possible due to flu 5. CAUSE: "What do you think is causing the leg pain?"     Clots  6. OTHER SYMPTOMS: "Do you have any other symptoms?" (e.g., chest pain, back pain, breathing difficulty, swelling, rash, fever, numbness, weakness)     Clots in bone marrow  Answer Assessment - Initial Assessment Questions 1. RESPIRATORY STATUS: "Describe your breathing?" (e.g., wheezing, shortness of breath, unable to speak, severe coughing)      Dry cough - intermittent 2. ONSET: "When did this breathing problem begin?"      yesterday 3. PATTERN "Does the difficult breathing come and go, or has it been constant since it started?"      Intermittent  4. SEVERITY: "How bad is your breathing?" (e.g., mild, moderate, severe)    -  MILD: No SOB at rest, mild SOB with walking, speaks normally in sentences, can lie down, no retractions, pulse < 100.    - MODERATE: SOB at rest, SOB with minimal exertion and prefers to sit, cannot lie down flat, speaks in phrases, mild retractions, audible wheezing, pulse 100-120.    - SEVERE: Very SOB at rest, speaks in single words, struggling to breathe, sitting hunched forward, retractions, pulse > 120      Denied shortness of breath  5. RECURRENT SYMPTOM: "Have you had difficulty breathing before?" If Yes, ask: "When was the last time?" and "What happened that time?"      Denied  6. CARDIAC HISTORY: "Do you have any  history of heart disease?" (e.g., heart attack, angina, bypass surgery, angioplasty)      None 7. LUNG HISTORY: "Do you have any history of lung disease?"  (e.g., pulmonary embolus, asthma, emphysema)     None  8. CAUSE: "What do you think is causing the breathing problem?"      Unknown  9. OTHER SYMPTOMS: "Do you have any other symptoms? (e.g., dizziness, runny nose, cough, chest pain, fever)     Dry cough Dizzy  10. O2 SATURATION MONITOR:  "Do you use an oxygen saturation monitor (pulse oximeter) at home?" If Yes, ask: "What is your reading (oxygen level) today?" "What is your usual oxygen saturation reading?" (e.g., 95%)       87%  Protocols used: Leg Pain-A-AH, Breathing Difficulty-A-AH

## 2023-05-12 NOTE — Discharge Instructions (Signed)
You were seen in the emergency department for cough along with left leg pain.  You tested positive for flu and influenza.  X-rays of your leg and an ultrasound of the leg did not show any fracture or blood clot.  We are prescribing you with some pain medicine and some medication for flu.  Please follow-up with your primary care doctor if your leg pain persists after treatment.  Return to the emergency department if any worsening or concerning symptoms

## 2023-05-12 NOTE — Progress Notes (Signed)
Left lower extremity venous duplex has been completed. Preliminary results can be found in CV Proc through chart review.  Results were given to Dr. Charm Barges.  05/12/23 12:10 PM Olen Cordial RVT

## 2023-05-12 NOTE — ED Provider Notes (Signed)
[place] EMERGENCY DEPARTMENT AT [facility] Provider Note   CSN: [id number] Arrival date & time: 05/12/23  1119     History {Add pertinent medical, surgical, social history, OB history to HPI:1} Chief Complaint  Patient presents with  . Leg Pain    Jessica Cooke is a 63 y.o. female.  She is here with a complaint of left lower leg pain since yesterday.  She does not recall any trauma.  She has tried Tylenol and ibuprofen.  She said it feels like back when she had COVID a few years ago.  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  Symptoms resolved over a few days and she has not had any further problems until yesterday.  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  Has had some bodyaches and cough.  She does not think she has had a fever.  No nausea vomiting  The history is provided by the patient.  Leg Pain Location:  Leg Time since incident:  2 days Injury: no   Leg location:  L lower leg Pain details:    Quality:  Aching   Severity:  Moderate   Onset quality:  Gradual   Timing:  Constant   Progression:  Unchanged Chronicity:  Recurrent Relieved by:  Nothing Ineffective treatments:  NSAIDs and acetaminophen Associated symptoms: no decreased ROM, no fever, no numbness and no tingling        Home Medications Prior to Admission medications   Medication Sig Start Date End Date Taking? Authorizing Provider  acetaminophen (TYLENOL) 500 MG tablet Take 2 tablets (1,000 mg total) by mouth every 8 (eight) hours as needed for mild pain. 02/03/21   Ghimire, Werner Lean, MD  atorvastatin (LIPITOR) 10 MG tablet Take 1 tablet (10 mg total) by mouth daily. 10/12/21   Storm Frisk, MD  gabapentin (NEURONTIN) 300 MG capsule Take 1 capsule (300 mg total) by mouth 3 (three) times daily. Patient not taking: Reported on 10/12/2021 08/10/21   Storm Frisk, MD  ibuprofen (ADVIL) 200 MG tablet Take 800 mg by mouth every 6 (six) hours  as needed for headache or moderate pain.    [provider]  levothyroxine (SYNTHROID) 100 MCG tablet Take 1 tablet (100 mcg total) by mouth daily before breakfast. Office visit req for further refills 02/13/23   Mayers, Cari S, PA-C  meloxicam (MOBIC) 15 MG tablet Take 1 tablet (15 mg total) by mouth daily. 10/12/21   Storm Frisk, MD  nicotine (NICODERM CQ - DOSED IN MG/24 HOURS) 21 mg/24hr patch Place 1 patch (21 mg total) onto the skin daily. Patient not taking: Reported on 10/12/2021 08/10/21   Storm Frisk, MD  predniSONE (DELTASONE) 10 MG tablet Take 4 tablets daily for 5 days then stop 10/12/21   Storm Frisk, MD      Allergies    Dilaudid [hydromorphone hcl] and Tramadol    Review of Systems   Review of Systems  Constitutional:  Negative for fever.  Respiratory:  Positive for cough.   Cardiovascular:  Negative for chest pain.  Gastrointestinal:  Negative for abdominal pain.  Genitourinary:  Negative for dysuria.    Physical Exam Updated Vital Signs BP 108/79 (BP Location: Left Arm)   Pulse (!) 106   Temp 98.3 F (36.8 C) (Oral)   Resp 16   SpO2 93%  Physical Exam Vitals and nursing note reviewed.  Constitutional:  General: She is not in acute distress.    Appearance: Normal appearance. She is well-developed.  HENT:     Head: Normocephalic and atraumatic.  Eyes:     Conjunctiva/sclera: Conjunctivae normal.  Cardiovascular:     Rate and Rhythm: Normal rate and regular rhythm.     Heart sounds: No murmur heard. Pulmonary:     Effort: Pulmonary effort is normal. No respiratory distress.     Breath sounds: Normal breath sounds.  Abdominal:     Palpations: Abdomen is soft.     Tenderness: There is no abdominal tenderness. There is no guarding or rebound.  Musculoskeletal:        General: Tenderness present. No swelling or deformity.     Cervical back: Neck supple.     Comments: Left lower extremity nontender hip thigh knee.  She has some  diffuse tenderness of her tib-fib.  Distal pulses motor and sensation intact.  No significant swelling or open wounds.  Skin:    General: Skin is warm and dry.     Capillary Refill: Capillary refill takes less than 2 seconds.  Neurological:     General: No focal deficit present.     Mental Status: She is alert.     Sensory: No sensory deficit.     Motor: No weakness.     ED Results / Procedures / Treatments   Labs (all labs ordered are listed, but only abnormal results are displayed) Labs Reviewed - No data to display  EKG None  Radiology No results found.  Procedures Procedures  {Document cardiac monitor, telemetry assessment procedure when appropriate:1}  Medications Ordered in ED Medications  oxyCODONE-acetaminophen (PERCOCET/ROXICET) 5-325 MG per tablet 1 tablet (has no administration in time range)    ED Course/ Medical Decision Making/ A&P Clinical Course as of 05/12/23 1147  Fri May 12, 2023  1146 MRI 10/22 - IMPRESSION: 1. Patchy bone marrow edema in the proximal left tibial metadiaphysis in a somewhat serpiginous distribution. Findings are nonspecific and could reflect developing bone infarction. Red marrow reconversion could also result in this appearance. Stress-related changes are felt to be less likely given the distribution and lack of cortical and periosteal changes. 2. Similar, although slightly less prominent changes are seen within the contralateral right tibia on large field-of-view coronal imaging. 3. Otherwise unremarkable pre and post-contrast MRI of the left lower leg  [MB]    Clinical Course User Index [MB] Terrilee Files, MD   {   Click here for ABCD2, HEART and other calculatorsREFRESH Note before signing :1}                              Medical Decision Making Amount and/or Complexity of Data Reviewed Radiology: ordered.  Risk Prescription drug management.   This patient complains of ***; this involves an extensive number of  treatment Options and is a complaint that carries with it a high risk of complications and morbidity. The differential includes ***  I ordered, reviewed and interpreted labs, which included *** I ordered medication *** and reviewed PMP when indicated. I ordered imaging studies which included *** and I independently    visualized and interpreted imaging which showed *** Additional history obtained from *** Previous records obtained and reviewed *** I consulted *** and discussed lab and imaging findings and discussed disposition.  Cardiac monitoring reviewed, *** Social determinants considered, *** Critical Interventions: ***  After the interventions stated above, I reevaluated the  patient and found *** Admission and further testing considered, ***   {Document critical care time when appropriate:1} {Document review of labs and clinical decision tools ie heart score, Chads2Vasc2 etc:1}  {Document your independent review of radiology images, and any outside records:1} {Document your discussion with family members, caretakers, and with consultants:1} {Document social determinants of health affecting pt's care:1} {Document your decision making why or why not admission, treatments were needed:1} Final Clinical Impression(s) / ED Diagnoses Final diagnoses:  None    Rx / DC Orders ED Discharge Orders     None

## 2023-05-13 NOTE — Progress Notes (Unsigned)
Established Patient Office Visit  Subjective   Patient ID: Jessica Cooke, female    DOB: 02-02-1961  Age: 62 y.o. MRN: [id number]  CC: PCP follow up   09/2021 Jessica Cooke is a 63 year old female with a history of hypertension, hypothyroidism, and spinal stenosis presents for follow up. Today she presents with worsening lower back pain. Other wise no further acute concerns.She states she worked this weekend at [employer] and exacerbated her condition at work with excessive walking and woke up in tears today. She states at times she experiences painful radiation to legs. She is requesting prednisone . In the past she has had injections that relieved her pain for 3 to 4 months.  Denies local trauma, loss of bowel, urinary incontinence.  Blood Pressure today 140/90 on second measurement. Patient continues to smoke 1-1.5 packs per day. She had a plan to quit on her birthday however failed. Denies alcohol or illicit drug use.  She was referred for mammogram at last visit but has not made an appointment yet.  Denied orange card. Requesting medication refills.  05/17/2023 This patient seen in return follow-up and recently was in the emergency room for influenza A she has recovered from this today she complains of left-sided leg pain tibia-fibula area she has had history of bone marrow inflammation and potential osteonecrosis of the left lower extremity she still having pain in this area previous MRI was done showing this in 2022 note she is self-pay cannot afford another MRI.  She is going to get Medicare but could not achieve Medicaid due to her job.  Patient also has a history of spinal stenosis and arthritis in the back hypertension hypothyroidism on Synthroid replacement.  Today on arrival blood pressure is normal.  And patient is not on any blood pressure medication. 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, unspecified lower leg (HCC) 05/17/2023   Aortic atherosclerosis (HCC) 08/10/2021   Essential hypertension 01/29/2021   Anxiety 01/29/2021   Mixed anxiety and depressive disorder 01/29/2021   Mixed hyperlipidemia 01/29/2021   Prediabetes 01/29/2021   Sciatica 01/29/2021   Tobacco abuse 01/29/2021   Hypothyroidism    Spinal stenosis    Arthritis 01/14/2020   Obesity 01/14/2020   Low back strain 07/19/2018   Past Medical History:  Diagnosis Date   Eczema    Hypothyroid    Thyroid disease    Past Surgical History:  Procedure Laterality Date   ABDOMINAL HYSTERECTOMY     TUBAL LIGATION     Social History   Tobacco Use   Smoking status: Every Day    Current packs/day: 1.50    Types: Cigarettes   Smokeless tobacco: Never  Substance Use Topics   Alcohol use: Yes    Comment: rarely   Drug use: No   Social History   Socioeconomic History   Marital status: Married    Spouse name: Not on file   Number of children: Not on file   Years of education: Not on file   Highest education level: Not on file  Occupational History   Not on file  Tobacco Use   Smoking status: Every Day    Current packs/day: 1.50    Types: Cigarettes   Smokeless tobacco: Never  Substance and Sexual Activity   Alcohol use: Yes    Comment: rarely   Drug use:  No   Sexual activity: Not on file  Other Topics Concern   Not on file  Social History Narrative   Not on file   Social Drivers of Health   Financial Resource Strain: Not on file  Food Insecurity: Not on file  Transportation Needs: Not on file  Physical Activity: Not on file  Stress: Not on file  Social Connections: Not on file  Intimate Partner Violence: Not on file   Family Status  Relation Name Status   Mother  Deceased   Father  Deceased   Sister  Alive   Brother  Alive  No partnership data on file   Family History  Problem Relation Age of Onset   Heart disease Mother     Hypertension Mother    Cancer Father    Heart disease Sister    Hypertension Sister    Hypertension Brother    Allergies  Allergen Reactions   Dilaudid [Hydromorphone Hcl] Nausea And Vomiting   Tramadol Nausea And Vomiting    Review of Systems  Constitutional: Negative.   HENT: Negative.    Eyes: Negative.   Respiratory: Negative.    Cardiovascular: Negative.   Gastrointestinal: Negative.   Genitourinary: Negative.   Musculoskeletal:  Positive for back pain.  Skin: Negative.   Neurological: Negative.   Endo/Heme/Allergies: Negative.   Psychiatric/Behavioral: Negative.        Objective:     BP 130/85 (BP Location: Right Arm, Patient Position: Sitting, Cuff Size: Normal)   Pulse 77   Wt 206 lb 9.6 oz (93.7 kg)   SpO2 93%   BMI 30.51 kg/m  BP Readings from Last 3 Encounters:  05/17/23 130/85  05/12/23 108/79  12/22/21 (!) 152/88   Wt Readings from Last 3 Encounters:  05/17/23 206 lb 9.6 oz (93.7 kg)  10/12/21 201 lb 3.2 oz (91.3 kg)  08/10/21 215 lb 6.4 oz (97.7 kg)      Physical Exam Constitutional:      Appearance: Normal appearance.  HENT:     Nose: Nose normal.     Mouth/Throat:     Mouth: Mucous membranes are moist.     Dentition: Dental caries present.     Pharynx: Oropharynx is clear.  Cardiovascular:     Rate and Rhythm: Normal rate and regular rhythm.     Pulses: Normal pulses.     Heart sounds: Normal heart sounds.  Pulmonary:     Effort: Pulmonary effort is normal.     Breath sounds: Normal breath sounds.  Abdominal:     General: Abdomen is flat. Bowel sounds are normal.     Palpations: Abdomen is soft.  Musculoskeletal:     Lumbar back: Tenderness present. Decreased range of motion.  Neurological:     Mental Status: She is alert.      No results found for any visits on 05/17/23.  Last CBC Lab Results  Component Value Date   WBC 6.8 08/10/2021   HGB 16.2 (H) 08/10/2021   HCT 46.8 (H) 08/10/2021   MCV 92 08/10/2021   MCH 31.8  08/10/2021   RDW 13.6 08/10/2021   PLT 485 (H) 08/10/2021   Last metabolic panel Lab Results  Component Value Date   GLUCOSE 88 08/10/2021   NA 143 08/10/2021   K 4.2 08/10/2021   CL 107 (H) 08/10/2021   CO2 20 08/10/2021   BUN 11 08/10/2021   CREATININE 0.80 08/10/2021   EGFR 83 08/10/2021   CALCIUM 9.9 08/10/2021   PROT 6.9  08/10/2021   ALBUMIN 4.6 08/10/2021   LABGLOB 2.3 08/10/2021   AGRATIO 2.0 08/10/2021   BILITOT 0.2 08/10/2021   ALKPHOS 67 08/10/2021   AST 18 08/10/2021   ALT 9 08/10/2021   ANIONGAP 7 02/03/2021   Last lipids Lab Results  Component Value Date   CHOL 188 08/10/2021   HDL 39 (L) 08/10/2021   LDLCALC 123 (H) 08/10/2021   TRIG 144 08/10/2021   CHOLHDL 4.8 (H) 08/10/2021   Last hemoglobin A1c No results found for: "HGBA1C" Last thyroid functions Lab Results  Component Value Date   TSH 3.810 08/10/2021   T4TOTAL 9.3 08/10/2021   Last vitamin D No results found for: "25OHVITD2", "25OHVITD3", "VD25OH" Last vitamin B12 and Folate No results found for: "VITAMINB12", "FOLATE"  The 10-year ASCVD risk score (Arnett DK, et al., 2019) is: 15.2%    Assessment & Plan:   Problem List Items Addressed This Visit       Cardiovascular and Mediastinum   Essential hypertension   Stable at this time not on therapy continue observation check labs      Relevant Medications   atorvastatin (LIPITOR) 10 MG tablet   Other Relevant Orders   CBC with Differential/Platelet   Comprehensive metabolic panel     Endocrine   Hypothyroidism   Continue Synthroid check labs      Relevant Medications   levothyroxine (SYNTHROID) 100 MCG tablet   Other Relevant Orders   Thyroid Panel With TSH     Musculoskeletal and Integument   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 classified elsewhere, unspecified lower leg (HCC) - Primary   Screen for HIV and lupus referred to orthopedics continue meloxicam would like to image patient but cannot be due to uninsured status       Relevant Orders   Ambulatory referral to Orthopedic Surgery   ANA w/Reflex     Other   Mixed anxiety and depressive disorder   Stable at this time      Mixed hyperlipidemia   Check labs continue statin      Relevant Medications   atorvastatin (LIPITOR) 10 MG tablet   Prediabetes   Tobacco abuse      Current smoking consumption amount: 1 and half packs a day Dicsussion on advise to quit smoking and smoking impacts: Lung and cardiovascular impacts  Patient's willingness to quit: Wants to quit  Methods to quit smoking discussed: Behavioral modification nicotine replacement  Medication management of smoking session drugs discussed: Nicotine patches  Resources provided:  AVS   Setting quit date not established  Follow-up arranged 2 months   Time spent counseling the patient: 5 minutes       Other Visit Diagnoses       Encounter for screening mammogram for malignant neoplasm of breast       Relevant Orders   MM 3D SCREENING MAMMOGRAM BILATERAL BREAST     Colon cancer screening       Relevant Orders   Fecal occult blood, imunochemical     Encounter for screening for HIV       Relevant Orders   HIV Antibody (routine testing w rflx)        Return in about 6 months (around 11/14/2023) for followup, primary care follow up.    Shan Levans, MD

## 2023-05-17 ENCOUNTER — Ambulatory Visit: Payer: Self-pay | Attending: Critical Care Medicine | Admitting: Critical Care Medicine

## 2023-05-17 ENCOUNTER — Encounter: Payer: Self-pay | Admitting: Critical Care Medicine

## 2023-05-17 VITALS — BP 130/85 | HR 77 | Wt 206.6 lb

## 2023-05-17 DIAGNOSIS — E782 Mixed hyperlipidemia: Secondary | ICD-10-CM | POA: Insufficient documentation

## 2023-05-17 DIAGNOSIS — Z713 Dietary counseling and surveillance: Secondary | ICD-10-CM | POA: Insufficient documentation

## 2023-05-17 DIAGNOSIS — Z720 Tobacco use: Secondary | ICD-10-CM

## 2023-05-17 DIAGNOSIS — M879 Osteonecrosis, unspecified: Secondary | ICD-10-CM | POA: Insufficient documentation

## 2023-05-17 DIAGNOSIS — F418 Other specified anxiety disorders: Secondary | ICD-10-CM

## 2023-05-17 DIAGNOSIS — R7303 Prediabetes: Secondary | ICD-10-CM | POA: Insufficient documentation

## 2023-05-17 DIAGNOSIS — Z1211 Encounter for screening for malignant neoplasm of colon: Secondary | ICD-10-CM

## 2023-05-17 DIAGNOSIS — Z114 Encounter for screening for human immunodeficiency virus [HIV]: Secondary | ICD-10-CM

## 2023-05-17 DIAGNOSIS — M90569 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 classified elsewhere, unspecified lower leg: Secondary | ICD-10-CM | POA: Insufficient documentation

## 2023-05-17 DIAGNOSIS — E039 Hypothyroidism, unspecified: Secondary | ICD-10-CM | POA: Insufficient documentation

## 2023-05-17 DIAGNOSIS — I10 Essential (primary) hypertension: Secondary | ICD-10-CM | POA: Insufficient documentation

## 2023-05-17 DIAGNOSIS — F17210 Nicotine dependence, cigarettes, uncomplicated: Secondary | ICD-10-CM | POA: Insufficient documentation

## 2023-05-17 DIAGNOSIS — Z1231 Encounter for screening mammogram for malignant neoplasm of breast: Secondary | ICD-10-CM

## 2023-05-17 DIAGNOSIS — E038 Other specified hypothyroidism: Secondary | ICD-10-CM

## 2023-05-17 MED ORDER — LEVOTHYROXINE SODIUM 100 MCG PO TABS: 100.0000 ug | ORAL_TABLET | Freq: Every day | ORAL | 2 refills | Status: AC

## 2023-05-17 MED ORDER — NICOTINE 21 MG/24HR TD PT24: 21.0000 mg | MEDICATED_PATCH | Freq: Every day | TRANSDERMAL | 0 refills | Status: AC

## 2023-05-17 MED ORDER — MELOXICAM 15 MG PO TABS: 15.0000 mg | ORAL_TABLET | Freq: Every day | ORAL | 3 refills | Status: AC

## 2023-05-17 MED ORDER — ATORVASTATIN CALCIUM 10 MG PO TABS: 10.0000 mg | ORAL_TABLET | Freq: Every day | ORAL | 3 refills | Status: AC

## 2023-05-17 NOTE — Assessment & Plan Note (Signed)
Stable at this time not on therapy continue observation check labs

## 2023-05-17 NOTE — Assessment & Plan Note (Signed)
Screen for HIV and lupus referred to orthopedics continue meloxicam would like to image patient but cannot be due to uninsured status

## 2023-05-17 NOTE — Assessment & Plan Note (Signed)
Check labs continue statin

## 2023-05-17 NOTE — Assessment & Plan Note (Signed)
? ? ?  Current smoking consumption amount: 1 and half packs a day ?? Dicsussion on advise to quit smoking and smoking impacts: Lung and cardiovascular impacts ? ?? Patient's willingness to quit: Wants to quit ? ?? Methods to quit smoking discussed: Behavioral modification nicotine replacement ? ?? Medication management of smoking session drugs discussed: Nicotine patches ? ?? Resources provided:  AVS  ? ?? Setting quit date not established ? ?? Follow-up arranged 2 months ? ? ?Time spent counseling the patient: 5 minutes ? ?

## 2023-05-17 NOTE — Patient Instructions (Signed)
Labs Medications refilled Reduce smoking  Mammogram ordered Fecal occult kit ordered for colon cancer screen Referral to orthopedics made  Return primary care 6 months

## 2023-05-17 NOTE — Assessment & Plan Note (Signed)
Stable at this time

## 2023-05-17 NOTE — Assessment & Plan Note (Signed)
Continue Synthroid check labs

## 2023-05-18 ENCOUNTER — Other Ambulatory Visit: Payer: Self-pay

## 2023-05-19 ENCOUNTER — Telehealth: Payer: Self-pay

## 2023-05-19 ENCOUNTER — Other Ambulatory Visit: Payer: Self-pay | Admitting: Critical Care Medicine

## 2023-05-19 ENCOUNTER — Encounter: Payer: Self-pay | Admitting: Critical Care Medicine

## 2023-05-19 DIAGNOSIS — M3219 Other organ or system involvement in systemic lupus erythematosus: Secondary | ICD-10-CM

## 2023-05-19 DIAGNOSIS — M90569 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 classified elsewhere, unspecified lower leg: Secondary | ICD-10-CM

## 2023-05-19 LAB — ENA+DNA/DS+ANTICH+CENTRO+FA...
Anti JO-1: <0.2 AI (ref 0.0–0.9)
Antiribosomal P Antibodies: <0.2 AI (ref 0.0–0.9)
Centromere Ab Screen: <0.2 AI (ref 0.0–0.9)
Chromatin Ab SerPl-aCnc: <0.2 AI (ref 0.0–0.9)
ENA RNP Ab: <0.2 AI (ref 0.0–0.9)
ENA SM Ab Ser-aCnc: <0.2 AI (ref 0.0–0.9)
ENA SSA (RO) Ab: <0.2 AI (ref 0.0–0.9)
ENA SSB (LA) Ab: <0.2 AI (ref 0.0–0.9)
Nucleolar Pattern: 1:320 {titer} — ABNORMAL HIGH
Scleroderma (Scl-70) (ENA) Antibody, IgG: <0.2 AI (ref 0.0–0.9)
Smith/RNP Antibodies: <0.2 AI (ref 0.0–0.9)
dsDNA Ab: <1 [IU]/mL (ref 0–9)

## 2023-05-19 LAB — CBC WITH DIFFERENTIAL/PLATELET
Basophils Absolute: 0 10*3/uL (ref 0.0–0.2)
Basos: 1 %
EOS (ABSOLUTE): 0.1 10*3/uL (ref 0.0–0.4)
Eos: 3 %
Hematocrit: 46.4 % (ref 34.0–46.6)
Hemoglobin: 15.8 g/dL (ref 11.1–15.9)
Immature Grans (Abs): 0 10*3/uL (ref 0.0–0.1)
Immature Granulocytes: 1 %
Lymphocytes Absolute: 2.1 10*3/uL (ref 0.7–3.1)
Lymphs: 53 %
MCH: 31.7 pg (ref 26.6–33.0)
MCHC: 34.1 g/dL (ref 31.5–35.7)
MCV: 93 fL (ref 79–97)
Monocytes Absolute: 0.3 10*3/uL (ref 0.1–0.9)
Monocytes: 7 %
Neutrophils Absolute: 1.3 10*3/uL — ABNORMAL LOW (ref 1.4–7.0)
Neutrophils: 35 %
Platelets: 402 10*3/uL (ref 150–450)
RBC: 4.98 x10E6/uL (ref 3.77–5.28)
RDW: 13.1 % (ref 11.7–15.4)
WBC: 3.8 10*3/uL (ref 3.4–10.8)

## 2023-05-19 LAB — ANA W/REFLEX: ANA Titer 1: POSITIVE — AB

## 2023-05-19 LAB — THYROID PANEL WITH TSH
Free Thyroxine Index: 2.1 (ref 1.2–4.9)
T3 Uptake Ratio: 22 % — ABNORMAL LOW (ref 24–39)
T4, Total: 9.7 ug/dL (ref 4.5–12.0)
TSH: 6.91 u[IU]/mL — ABNORMAL HIGH (ref 0.450–4.500)

## 2023-05-19 LAB — COMPREHENSIVE METABOLIC PANEL
ALT: 31 [IU]/L (ref 0–32)
AST: 32 [IU]/L (ref 0–40)
Albumin: 4.5 g/dL (ref 3.9–4.9)
Alkaline Phosphatase: 52 [IU]/L (ref 44–121)
BUN/Creatinine Ratio: 9 — ABNORMAL LOW (ref 12–28)
BUN: 8 mg/dL (ref 8–27)
Bilirubin Total: 0.4 mg/dL (ref 0.0–1.2)
CO2: 22 mmol/L (ref 20–29)
Calcium: 9.6 mg/dL (ref 8.7–10.3)
Chloride: 104 mmol/L (ref 96–106)
Creatinine, Ser: 0.85 mg/dL (ref 0.57–1.00)
Globulin, Total: 2 g/dL (ref 1.5–4.5)
Glucose: 102 mg/dL — ABNORMAL HIGH (ref 70–99)
Potassium: 3.4 mmol/L — ABNORMAL LOW (ref 3.5–5.2)
Sodium: 143 mmol/L (ref 134–144)
Total Protein: 6.5 g/dL (ref 6.0–8.5)
eGFR: 76 mL/min/{1.73_m2} (ref >59)

## 2023-05-19 LAB — HIV ANTIBODY (ROUTINE TESTING W REFLEX): HIV Screen 4th Generation wRfx: NONREACTIVE

## 2023-05-19 NOTE — Progress Notes (Signed)
Disregard prior not about no autoimmune disease  she does have evidence of lupus  this is likely cause of bone pain,  I have put in order for rheumatology consult rest of info is correcyt

## 2023-05-19 NOTE — Telephone Encounter (Signed)
 Pt was called and is aware of results, DOB was confirmed.  ?

## 2023-05-19 NOTE — Telephone Encounter (Signed)
-----   Message from Shan Levans sent at 05/19/2023 11:51 AM EST ----- Let patient know no inflammation from an autoimmune disease, hiv neg   thyroid ok stay on same dose Blood count normal, potassium slightly low eat plenty of fruits, bananas,  kidney liver normal

## 2023-05-19 NOTE — Progress Notes (Signed)
Let patient know no inflammation from an autoimmune disease, hiv neg   thyroid ok stay on same dose Blood count normal, potassium slightly low eat plenty of fruits, bananas,  kidney liver normal

## 2023-05-29 ENCOUNTER — Telehealth: Payer: Self-pay

## 2023-05-29 NOTE — Telephone Encounter (Signed)
 Pt was called and vm was left, Information has been sent to nurse pool.

## 2023-05-29 NOTE — Telephone Encounter (Signed)
-----   Message from Arlene Lacy sent at 05/19/2023  4:58 PM EST ----- Disregard prior not about no autoimmune disease  she does have evidence of lupus  this is likely cause of bone pain,  I have put in order for rheumatology consult rest of info is correcyt

## 2023-06-12 ENCOUNTER — Emergency Department (HOSPITAL_COMMUNITY): Payer: Self-pay

## 2023-06-12 ENCOUNTER — Emergency Department (HOSPITAL_COMMUNITY)
Admission: EM | Admit: 2023-06-12 | Discharge: 2023-06-12 | Discharge status: Against Medical Advice | Payer: Self-pay | Attending: Emergency Medicine | Admitting: Emergency Medicine

## 2023-06-12 ENCOUNTER — Other Ambulatory Visit: Payer: Self-pay

## 2023-06-12 ENCOUNTER — Encounter (HOSPITAL_COMMUNITY): Payer: Self-pay

## 2023-06-12 DIAGNOSIS — R079 Chest pain, unspecified: Secondary | ICD-10-CM | POA: Insufficient documentation

## 2023-06-12 DIAGNOSIS — Z5321 Procedure and treatment not carried out due to patient leaving prior to being seen by health care provider: Secondary | ICD-10-CM | POA: Insufficient documentation

## 2023-06-12 DIAGNOSIS — M25511 Pain in right shoulder: Secondary | ICD-10-CM | POA: Insufficient documentation

## 2023-06-12 DIAGNOSIS — I10 Essential (primary) hypertension: Secondary | ICD-10-CM | POA: Insufficient documentation

## 2023-06-12 LAB — CBC
HCT: 48.4 % — ABNORMAL HIGH (ref 36.0–46.0)
Hemoglobin: 16 g/dL — ABNORMAL HIGH (ref 12.0–15.0)
MCH: 31.6 pg (ref 26.0–34.0)
MCHC: 33.1 g/dL (ref 30.0–36.0)
MCV: 95.5 fL (ref 80.0–100.0)
Platelets: 476 10*3/uL — ABNORMAL HIGH (ref 150–400)
RBC: 5.07 MIL/uL (ref 3.87–5.11)
RDW: 13.8 % (ref 11.5–15.5)
WBC: 6.1 10*3/uL (ref 4.0–10.5)
nRBC: 0 % (ref 0.0–0.2)

## 2023-06-12 LAB — BASIC METABOLIC PANEL
Anion gap: 10 (ref 5–15)
BUN: 12 mg/dL (ref 8–23)
CO2: 20 mmol/L — ABNORMAL LOW (ref 22–32)
Calcium: 9.3 mg/dL (ref 8.9–10.3)
Chloride: 109 mmol/L (ref 98–111)
Creatinine, Ser: 0.7 mg/dL (ref 0.44–1.00)
GFR, Estimated: >60 mL/min (ref >60)
Glucose, Bld: 190 mg/dL — ABNORMAL HIGH (ref 70–99)
Potassium: 3.4 mmol/L — ABNORMAL LOW (ref 3.5–5.1)
Sodium: 139 mmol/L (ref 135–145)

## 2023-06-12 LAB — TROPONIN I (HIGH SENSITIVITY): Troponin I (High Sensitivity): 3 ng/L (ref <18)

## 2023-06-12 NOTE — ED Provider Triage Note (Signed)
 Emergency Medicine Provider Triage Evaluation Note  Jessica Cooke , a 63 y.o. female  was evaluated in triage.  Pt complains of chest pain intermittently and right shoulder pain since Friday. Hx of anxiety, depression, hypertension, hld, aortic atherosclerosis. No previous hx of blood clots, no recent travel  Review of Systems  Positive: Chest pain, shoulder pain Negative:   Physical Exam  BP (!) 142/95 (BP Location: Left Arm)   Pulse (!) 105   Temp 98.8 F (37.1 C) (Oral)   Resp 18   Ht 5\' 9"  (1.753 m)   Wt 93.7 kg   SpO2 100%   BMI 30.51 kg/m  Gen:   Awake, no distress   Resp:  Normal effort  MSK:   Moves extremities without difficulty  Other:  Mild tachycardia, normal rate  Medical Decision Making  Medically screening exam initiated at 3:59 PM.  Appropriate orders placed.  Jessica Cooke was informed that the remainder of the evaluation will be completed by another provider, this initial triage assessment does not replace that evaluation, and the importance of remaining in the ED until their evaluation is complete.  Workup initiated in triage    [name], [place] 06/12/23 1601

## 2023-06-12 NOTE — ED Triage Notes (Signed)
 Pt reports with chest pain and right shoulder pain since Friday.

## 2023-08-29 ENCOUNTER — Other Ambulatory Visit: Payer: Self-pay | Admitting: Family Medicine

## 2023-08-29 ENCOUNTER — Other Ambulatory Visit: Payer: Self-pay | Admitting: Critical Care Medicine

## 2023-08-29 DIAGNOSIS — Z1231 Encounter for screening mammogram for malignant neoplasm of breast: Secondary | ICD-10-CM

## 2023-08-29 DIAGNOSIS — E2839 Other primary ovarian failure: Secondary | ICD-10-CM

## 2023-08-29 DIAGNOSIS — Z122 Encounter for screening for malignant neoplasm of respiratory organs: Secondary | ICD-10-CM

## 2023-08-29 LAB — LAB REPORT - SCANNED
Albumin, Urine POC: 0.2
Calcium: 9.8
Creatinine, POC: 177 mg/dL
EGFR: 69
Free T4: 1.4 ng/dL
HM HIV Screening: NEGATIVE
HM Hepatitis Screen: NEGATIVE
PTH: 46

## 2023-09-12 ENCOUNTER — Other Ambulatory Visit: Payer: Self-pay

## 2023-09-19 ENCOUNTER — Ambulatory Visit
Admission: RE | Admit: 2023-09-19 | Discharge: 2023-09-19 | Disposition: A | Discharge status: Home / Self Care | Payer: Self-pay | Source: Ambulatory Visit | Attending: Family Medicine

## 2023-09-19 DIAGNOSIS — Z122 Encounter for screening for malignant neoplasm of respiratory organs: Secondary | ICD-10-CM

## 2023-10-12 ENCOUNTER — Telehealth: Payer: Self-pay | Admitting: Internal Medicine

## 2023-10-12 ENCOUNTER — Encounter: Payer: Self-pay | Admitting: Internal Medicine

## 2023-10-12 NOTE — Telephone Encounter (Signed)
 Pt called stating she thought her NPT appointment was at 1pm today. Pt was advised that we have a no show policy. Pt wanted me to ask if we could make an exception to reschedule.

## 2023-10-12 NOTE — Progress Notes (Deleted)
 Office Visit Note  Patient: Jessica Cooke             Date of Birth: May 19, 1960           MRN: [id number]             PCP: Brien Belvie BRAVO, MD Referring: Brien Belvie BRAVO, MD Visit Date: 10/12/2023 Occupation: @GUAROCC @  Subjective:  No chief complaint on file.   History of Present Illness: Jessica Cooke is a 63 y.o. female ***     Activities of Daily Living:  Patient reports morning stiffness for *** {minute/hour:19697}.   Patient {ACTIONS;DENIES/REPORTS:21021675::Denies} nocturnal pain.  Difficulty dressing/grooming: {ACTIONS;DENIES/REPORTS:21021675::Denies} Difficulty climbing stairs: {ACTIONS;DENIES/REPORTS:21021675::Denies} Difficulty getting out of chair: {ACTIONS;DENIES/REPORTS:21021675::Denies} Difficulty using hands for taps, buttons, cutlery, and/or writing: {ACTIONS;DENIES/REPORTS:21021675::Denies}  No Rheumatology ROS completed.   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, unspecified lower leg (HCC) 05/17/2023   Aortic atherosclerosis (HCC) 08/10/2021   Essential hypertension 01/29/2021   Anxiety 01/29/2021   Mixed anxiety and depressive disorder 01/29/2021   Mixed hyperlipidemia 01/29/2021   Prediabetes 01/29/2021   Sciatica 01/29/2021   Tobacco abuse 01/29/2021   Hypothyroidism    Spinal stenosis    Arthritis 01/14/2020   Obesity 01/14/2020   Low back strain 07/19/2018    Past Medical History:  Diagnosis Date   Eczema    Hypothyroid    Thyroid  disease     Family History  Problem Relation Age of Onset   Heart disease Mother    Hypertension Mother    Cancer Father    Heart disease Sister    Hypertension Sister    Hypertension Brother    Past Surgical History:  Procedure Laterality Date   ABDOMINAL HYSTERECTOMY     TUBAL LIGATION     Social History   Social History Narrative   Not on file   Immunization History  Administered Date(s) Administered    PFIZER(Purple Top)SARS-COV-2 Vaccination 10/23/2019, 11/13/2019   Tdap 07/23/2020     Objective: Vital Signs: There were no vitals taken for this visit.   Physical Exam   Musculoskeletal Exam: ***  CDAI Exam: CDAI Score: -- Patient Global: --; Provider Global: -- Swollen: --; Tender: -- Joint Exam 10/12/2023   No joint exam has been documented for this visit   There is currently no information documented on the homunculus. Go to the Rheumatology activity and complete the homunculus joint exam.  Investigation: No additional findings.  Imaging: CT CHEST LUNG CA SCREEN LOW DOSE W/O CM Result Date: 10/07/2023 CLINICAL DATA:  Current smoker, 50 pack-year smoking history, chronic cough EXAM: CT CHEST WITHOUT CONTRAST LOW-DOSE FOR LUNG CANCER SCREENING TECHNIQUE: Multidetector CT imaging of the chest was performed following the standard protocol without IV contrast. RADIATION DOSE REDUCTION: This exam was performed according to the departmental dose-optimization program which includes automated exposure control, adjustment of the mA and/or kV according to patient size and/or use of iterative reconstruction technique. COMPARISON:  01/29/2021, 08/16/2009 FINDINGS: Cardiovascular: Extensive multi-vessel coronary artery calcification. Global cardiac size within normal limits. No pericardial effusion. Central pulmonary arteries are enlarged in keeping with changes of pulmonary arterial hypertension. Mild atherosclerotic calcification within the thoracic aorta. No aortic aneurysm. Mediastinum/Nodes: No enlarged mediastinal, hilar, or axillary lymph nodes. Thyroid  gland, trachea, and esophagus demonstrate no significant findings. Lungs/Pleura: Mild emphysema. Multiple pulmonary nodules are identified, largest within the right upper lobe with mean diameter of 7.1 mm (72/9). See radiologist derived volumetric analysis. Dominant  nodule within the right upper lobe was not clearly identified on prior  examination. No pneumothorax or pleural effusion. No central obstructing lesion. Upper Abdomen: No acute abnormality. Musculoskeletal: 3.5 cm rounded mass within the right breast is not well characterized on this examination, but was present prior examination suggesting an underlying benign etiology. No acute bone abnormality. No lytic or blastic bone lesion. Osseous structures are age appropriate. IMPRESSION: Lung-RADS 3, probably benign findings. Short-term follow-up in 6 months is recommended with repeat low-dose chest CT without contrast (please use the following order, CT CHEST LCS NODULE FOLLOW-UP W/O CM). Extensive coronary artery calcification. Mild cardiomegaly. Morphologic changes in keeping with pulmonary arterial hypertension. Mild emphysema. Aortic Atherosclerosis (ICD10-I70.0) and Emphysema (ICD10-J43.9). Electronically Signed   By: Dorethia Molt M.D.   On: 10/07/2023 22:25    Recent Labs: Lab Results  Component Value Date   WBC 6.1 06/12/2023   HGB 16.0 (H) 06/12/2023   PLT 476 (H) 06/12/2023   NA 139 06/12/2023   K 3.4 (L) 06/12/2023   CL 109 06/12/2023   CO2 20 (L) 06/12/2023   GLUCOSE 190 (H) 06/12/2023   BUN 12 06/12/2023   CREATININE 0.70 06/12/2023   BILITOT 0.4 05/17/2023   ALKPHOS 52 05/17/2023   AST 32 05/17/2023   ALT 31 05/17/2023   PROT 6.5 05/17/2023   ALBUMIN 4.5 05/17/2023   CALCIUM  9.3 06/12/2023   GFRAA 87 01/27/2020    Speciality Comments: No specialty comments available.  Procedures:  No procedures performed Allergies: Dilaudid [hydromorphone hcl] and Tramadol   Assessment / Plan:     Visit Diagnoses: No diagnosis found.  Orders: No orders of the defined types were placed in this encounter.  No orders of the defined types were placed in this encounter.   Face-to-face time spent with patient was *** minutes. Greater than 50% of time was spent in counseling and coordination of care.  Follow-Up Instructions: No follow-ups on  file.   Lonni LELON Ester, MD  Note - This record has been created using [employer].  Chart creation errors have been sought, but may not always  have been located. Such creation errors do not reflect on  the standard of medical care.

## 2023-10-17 NOTE — Progress Notes (Unsigned)
 Office Visit Note  Patient: Jessica Cooke             Date of Birth: 1960-12-20           MRN: [id number]             PCP: Brien Belvie BRAVO, MD Referring: Brien Belvie BRAVO, MD Visit Date: 10/18/2023 Occupation: @GUAROCC @  Subjective:  No chief complaint on file.   History of Present Illness: Aqua Denslow is a 63 y.o. female ***     Activities of Daily Living:  Patient reports morning stiffness for *** {minute/hour:19697}.   Patient {ACTIONS;DENIES/REPORTS:21021675::Denies} nocturnal pain.  Difficulty dressing/grooming: {ACTIONS;DENIES/REPORTS:21021675::Denies} Difficulty climbing stairs: {ACTIONS;DENIES/REPORTS:21021675::Denies} Difficulty getting out of chair: {ACTIONS;DENIES/REPORTS:21021675::Denies} Difficulty using hands for taps, buttons, cutlery, and/or writing: {ACTIONS;DENIES/REPORTS:21021675::Denies}  No Rheumatology ROS completed.   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, unspecified lower leg (HCC) 05/17/2023   Aortic atherosclerosis (HCC) 08/10/2021   Essential hypertension 01/29/2021   Anxiety 01/29/2021   Mixed anxiety and depressive disorder 01/29/2021   Mixed hyperlipidemia 01/29/2021   Prediabetes 01/29/2021   Sciatica 01/29/2021   Tobacco abuse 01/29/2021   Hypothyroidism    Spinal stenosis    Arthritis 01/14/2020   Obesity 01/14/2020   Low back strain 07/19/2018    Past Medical History:  Diagnosis Date   Eczema    Hypothyroid    Thyroid  disease     Family History  Problem Relation Age of Onset   Heart disease Mother    Hypertension Mother    Cancer Father    Heart disease Sister    Hypertension Sister    Hypertension Brother    Past Surgical History:  Procedure Laterality Date   ABDOMINAL HYSTERECTOMY     TUBAL LIGATION     Social History   Social History Narrative   Not on file   Immunization History  Administered Date(s) Administered    PFIZER(Purple Top)SARS-COV-2 Vaccination 10/23/2019, 11/13/2019   Tdap 07/23/2020     Objective: Vital Signs: There were no vitals taken for this visit.   Physical Exam   Musculoskeletal Exam: ***  CDAI Exam: CDAI Score: -- Patient Global: --; Provider Global: -- Swollen: --; Tender: -- Joint Exam 10/18/2023   No joint exam has been documented for this visit   There is currently no information documented on the homunculus. Go to the Rheumatology activity and complete the homunculus joint exam.  Investigation: No additional findings.  Imaging: CT CHEST LUNG CA SCREEN LOW DOSE W/O CM Result Date: 10/07/2023 CLINICAL DATA:  Current smoker, 50 pack-year smoking history, chronic cough EXAM: CT CHEST WITHOUT CONTRAST LOW-DOSE FOR LUNG CANCER SCREENING TECHNIQUE: Multidetector CT imaging of the chest was performed following the standard protocol without IV contrast. RADIATION DOSE REDUCTION: This exam was performed according to the departmental dose-optimization program which includes automated exposure control, adjustment of the mA and/or kV according to patient size and/or use of iterative reconstruction technique. COMPARISON:  01/29/2021, 08/16/2009 FINDINGS: Cardiovascular: Extensive multi-vessel coronary artery calcification. Global cardiac size within normal limits. No pericardial effusion. Central pulmonary arteries are enlarged in keeping with changes of pulmonary arterial hypertension. Mild atherosclerotic calcification within the thoracic aorta. No aortic aneurysm. Mediastinum/Nodes: No enlarged mediastinal, hilar, or axillary lymph nodes. Thyroid  gland, trachea, and esophagus demonstrate no significant findings. Lungs/Pleura: Mild emphysema. Multiple pulmonary nodules are identified, largest within the right upper lobe with mean diameter of 7.1 mm (72/9). See radiologist derived volumetric analysis. Dominant  nodule within the right upper lobe was not clearly identified on prior  examination. No pneumothorax or pleural effusion. No central obstructing lesion. Upper Abdomen: No acute abnormality. Musculoskeletal: 3.5 cm rounded mass within the right breast is not well characterized on this examination, but was present prior examination suggesting an underlying benign etiology. No acute bone abnormality. No lytic or blastic bone lesion. Osseous structures are age appropriate. IMPRESSION: Lung-RADS 3, probably benign findings. Short-term follow-up in 6 months is recommended with repeat low-dose chest CT without contrast (please use the following order, CT CHEST LCS NODULE FOLLOW-UP W/O CM). Extensive coronary artery calcification. Mild cardiomegaly. Morphologic changes in keeping with pulmonary arterial hypertension. Mild emphysema. Aortic Atherosclerosis (ICD10-I70.0) and Emphysema (ICD10-J43.9). Electronically Signed   By: Dorethia Molt M.D.   On: 10/07/2023 22:25    Recent Labs: Lab Results  Component Value Date   WBC 6.1 06/12/2023   HGB 16.0 (H) 06/12/2023   PLT 476 (H) 06/12/2023   NA 139 06/12/2023   K 3.4 (L) 06/12/2023   CL 109 06/12/2023   CO2 20 (L) 06/12/2023   GLUCOSE 190 (H) 06/12/2023   BUN 12 06/12/2023   CREATININE 0.70 06/12/2023   BILITOT 0.4 05/17/2023   ALKPHOS 52 05/17/2023   AST 32 05/17/2023   ALT 31 05/17/2023   PROT 6.5 05/17/2023   ALBUMIN 4.5 05/17/2023   CALCIUM  9.3 06/12/2023   GFRAA 87 01/27/2020    Speciality Comments: No specialty comments available.  Procedures:  No procedures performed Allergies: Dilaudid [hydromorphone hcl] and Tramadol   Assessment / Plan:     Visit Diagnoses: No diagnosis found.  Orders: No orders of the defined types were placed in this encounter.  No orders of the defined types were placed in this encounter.   Face-to-face time spent with patient was *** minutes. Greater than 50% of time was spent in counseling and coordination of care.  Follow-Up Instructions: No follow-ups on  file.   Lonni LELON Ester, MD  Note - This record has been created using [employer].  Chart creation errors have been sought, but may not always  have been located. Such creation errors do not reflect on  the standard of medical care.

## 2023-10-18 ENCOUNTER — Encounter: Payer: Self-pay | Admitting: Internal Medicine

## 2023-10-18 ENCOUNTER — Ambulatory Visit: Attending: Internal Medicine | Admitting: Internal Medicine

## 2023-10-18 VITALS — BP 117/74 | HR 77 | Resp 14 | Ht 69.0 in | Wt 210.0 lb

## 2023-10-18 DIAGNOSIS — E038 Other specified hypothyroidism: Secondary | ICD-10-CM

## 2023-10-18 DIAGNOSIS — M3219 Other organ or system involvement in systemic lupus erythematosus: Secondary | ICD-10-CM

## 2023-10-18 DIAGNOSIS — R768 Other specified abnormal immunological findings in serum: Secondary | ICD-10-CM | POA: Diagnosis not present

## 2023-10-18 DIAGNOSIS — M90569 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 classified elsewhere, unspecified lower leg: Secondary | ICD-10-CM

## 2023-10-22 LAB — LUPUS ANTICOAGULANT EVAL W/ REFLEX
PTT-LA Screen: 41 s — ABNORMAL HIGH (ref <=40)
dRVVT: 32 s (ref <=45)

## 2023-10-22 LAB — RFLX HEXAGONAL PHASE CONFIRM: Hexagonal Phase Conf: NEGATIVE

## 2023-10-22 LAB — CARDIOLIPIN ANTIBODIES, IGG, IGM, IGA
Anticardiolipin IgA: <2 [APL'U]/mL (ref <20.0)
Anticardiolipin IgG: <2 [GPL'U]/mL (ref <20.0)
Anticardiolipin IgM: <2 [MPL'U]/mL (ref <20.0)

## 2023-10-22 LAB — BETA-2 GLYCOPROTEIN ANTIBODIES
Beta-2 Glyco 1 IgA: <2 U/mL (ref <20.0)
Beta-2 Glyco 1 IgM: <2 U/mL (ref <20.0)
Beta-2 Glyco I IgG: <2 U/mL (ref <20.0)

## 2023-10-22 LAB — C3 AND C4
C3 Complement: 108 mg/dL (ref 83–193)
C4 Complement: 23 mg/dL (ref 15–57)

## 2023-11-29 LAB — COLOGUARD: COLOGUARD: NEGATIVE

## 2024-05-16 ENCOUNTER — Ambulatory Visit: Payer: Self-pay

## 2024-05-16 ENCOUNTER — Other Ambulatory Visit: Payer: Self-pay
